# Patient Record
Sex: Female | Born: 1960
Health system: Southern US, Community
[De-identification: ages and names within clinical notes are randomized; demographics above are authoritative.]

## PROBLEM LIST (undated history)

## (undated) DIAGNOSIS — I10 Essential (primary) hypertension: Secondary | ICD-10-CM

## (undated) DIAGNOSIS — F329 Major depressive disorder, single episode, unspecified: Secondary | ICD-10-CM

## (undated) DIAGNOSIS — F32A Depression, unspecified: Secondary | ICD-10-CM

## (undated) DIAGNOSIS — K219 Gastro-esophageal reflux disease without esophagitis: Secondary | ICD-10-CM

## (undated) DIAGNOSIS — E119 Type 2 diabetes mellitus without complications: Secondary | ICD-10-CM

## (undated) DIAGNOSIS — G4730 Sleep apnea, unspecified: Secondary | ICD-10-CM

## (undated) HISTORY — PX: UPPER GASTROINTESTINAL ENDOSCOPY: SHX188

## (undated) HISTORY — PX: DIAGNOSTIC LAPAROSCOPY: SUR761

---

## 1997-04-13 ENCOUNTER — Ambulatory Visit (HOSPITAL_COMMUNITY): Admission: RE | Admit: 1997-04-13 | Discharge: 1997-04-13 | Payer: Self-pay | Admitting: *Deleted

## 1999-12-03 ENCOUNTER — Encounter: Payer: Self-pay | Admitting: Family Medicine

## 1999-12-03 ENCOUNTER — Encounter: Admission: RE | Admit: 1999-12-03 | Discharge: 1999-12-03 | Payer: Self-pay | Admitting: Family Medicine

## 2000-08-18 ENCOUNTER — Encounter: Payer: Self-pay | Admitting: *Deleted

## 2000-08-18 ENCOUNTER — Ambulatory Visit (HOSPITAL_COMMUNITY): Admission: RE | Admit: 2000-08-18 | Discharge: 2000-08-18 | Payer: Self-pay | Admitting: *Deleted

## 2000-09-08 ENCOUNTER — Other Ambulatory Visit: Admission: RE | Admit: 2000-09-08 | Discharge: 2000-09-08 | Payer: Self-pay | Admitting: *Deleted

## 2001-07-16 ENCOUNTER — Ambulatory Visit (HOSPITAL_COMMUNITY): Admission: RE | Admit: 2001-07-16 | Discharge: 2001-07-16 | Payer: Self-pay | Admitting: Family Medicine

## 2001-12-30 ENCOUNTER — Other Ambulatory Visit: Admission: RE | Admit: 2001-12-30 | Discharge: 2001-12-30 | Payer: Self-pay | Admitting: Obstetrics and Gynecology

## 2003-07-03 ENCOUNTER — Other Ambulatory Visit: Admission: RE | Admit: 2003-07-03 | Discharge: 2003-07-03 | Payer: Self-pay | Admitting: Obstetrics and Gynecology

## 2004-10-08 ENCOUNTER — Other Ambulatory Visit: Admission: RE | Admit: 2004-10-08 | Discharge: 2004-10-08 | Payer: Self-pay | Admitting: Obstetrics and Gynecology

## 2006-09-17 ENCOUNTER — Observation Stay (HOSPITAL_COMMUNITY): Admission: EM | Admit: 2006-09-17 | Discharge: 2006-09-18 | Payer: Self-pay | Admitting: Emergency Medicine

## 2008-12-10 ENCOUNTER — Emergency Department (HOSPITAL_COMMUNITY): Admission: EM | Admit: 2008-12-10 | Discharge: 2008-12-10 | Payer: Self-pay | Admitting: Emergency Medicine

## 2009-11-07 ENCOUNTER — Other Ambulatory Visit: Admission: RE | Admit: 2009-11-07 | Discharge: 2009-11-07 | Payer: Self-pay | Admitting: Family Medicine

## 2009-11-22 ENCOUNTER — Encounter: Admission: RE | Admit: 2009-11-22 | Discharge: 2009-11-22 | Payer: Self-pay | Admitting: Gastroenterology

## 2010-06-04 NOTE — Discharge Summary (Signed)
[name], [name]               ACCOUNT NO.:  [id number]   MEDICAL RECORD NO.:  [id number]          PATIENT TYPE:  OBV   LOCATION:  2037                         FACILITY:  [facility]   PHYSICIAN:  Herbie Saxon, MDDATE OF BIRTH:  Jan 01, 1961   DATE OF ADMISSION:  09/17/2006  DATE OF DISCHARGE:  09/18/2006                               DISCHARGE SUMMARY   DISCHARGE DIAGNOSES:  1. Chest pain, noncardiac, musculoskeletal.  2. Hypertension, stable.  3. Depression.   HOSPITAL COURSE:  This 50 year old Caucasian female presented with 10/10  substernal chest pain which  woke her up from sleep on the day of  presentation. The chest pain was intermittent associated with  palpitations, improved with aspirin and nitroglycerin in the emergency  room. There was associated light-headedness; however, the patient's  chest pain was noticed also to be increased with movement and palpation.  Serial cardiac enzymes have been negative. The patient is asymptomatic  at present. Associated anxiety disorder also can be attributing to this  chest pain. The patient has not seen a psychiatrist, Dr. Milagros Evener,  in the last 4 years. She is being discharged home in stable condition to  followup with the primary care physician, Dr. Bradd Canary, in 5-7 days.  A possible stress echo may be ordered as an outpatient. The patient's  primary care physician is to arrange followup reevaluation by  psychiatrist in the next 1-2 weeks.   RADIOLOGY:  The chest x-ray of September 17, 2006 was negative.   DISCHARGE MEDICATIONS:  1. Altace 2.5 mg daily.  2. Citalopram 10 mg daily.  3. Wellbutrin XL 300 mg daily.  4. Niacin 100 mg daily.  5. Aspirin 81 mg daily.  6. Xanax 0.25 mg b.i.d.  7. Nitroglycerin 0.4 mg sublingual p.r.n.  8. Naproxen 500 mg q.12 h p.r.n.   Note that a lipid panel did show that she has dyslipidemia with slightly  low HDL cholesterol and elevated LDL cholesterol of 120, HDL of 38,  total  cholesterol was normal.   DISPOSITION:  Home.   DIET:  Cardiac.   ACTIVITY:  Slowly as tolerated.   PHYSICAL EXAMINATION:  On examination today, she is a middle-age lady  not in acute distress.  Temperature is 98, pulse is 62, respiratory rate is 18, blood pressure  121/59.  Pupils equal, reactive to light and accommodation.  Neck is supple. Oropharynx and nasopharynx are clear. Mucous membranes  are moist. Head is atraumatic, normocephalic. No carotid bruit, no  thyromegaly. No elevated jugular venous distention.  CHEST:  Clear. No tenderness on palpations.  HEART:  S1, S2, regular rate and rhythm. No murmur.  ABDOMEN:  Soft, nontender, no organomegaly. Bowel sounds are normal.  She is alert and oriented x3.  Peripheral pulses present, no pedal edema.  Mood is stable.  </LABORATORY DATA  Total cholesterol 181, LDL fasting 10, HDL fasting 38. Troponin 0.02,  TSH is 2.8.   Discharge less than 30 minutes.      Herbie Saxon, MD  Electronically Signed     MIO/MEDQ  D:  09/18/2006  T:  09/18/2006  Job:  [id number]

## 2010-06-04 NOTE — H&P (Signed)
[name], [name]               ACCOUNT NO.:  [id number]   MEDICAL RECORD NO.:  [id number]          PATIENT TYPE:  OBV   LOCATION:  2037                         FACILITY:  [facility]   PHYSICIAN:  Herbie Saxon, MDDATE OF BIRTH:  Jun 11, 1960   DATE OF ADMISSION:  09/17/2006  DATE OF DISCHARGE:                              HISTORY & PHYSICAL   PRESENTING COMPLAINT:  Chest pain of 1 day's duration.   PSYCHIATRIST:  Milagros Evener, M.D.   PRIMARY CARE PHYSICIAN:  Quita Skye. Artis Flock, M.D.   HISTORY OF PRESENTING COMPLAINT:  This is a 50 year old Caucasian female  who woke up at 5:45 a.m. this morning complaining of 10/10 substernal  pressure like chest pain which woke her up from sleep.  The chest pain  includes specific movement but not with palpation.  She has occasional  palpitations with the heart racing.  She feels slightly lightheaded but  no episode of loss of consciousness or previous syncopal episodes.  There was no diaphoresis.  No nausea, vomiting, diarrhea or abdominal  pain.  No cough.  No body swelling.  She denies any paroxysmal nocturnal  dyspnea or orthopnea.  No skin rash.  No dysuria.  No hematuria.  No  joint swelling.  She denies any back pain, headache or lymph node  swelling.  The patient has not seen her psychiatrist in the last 4  years.  She just stopped on her own.  Continues taking the Celexa and  Wellbutrin at the previous dose.   PAST MEDICAL HISTORY:  1. Hypertension diagnosed 10 years ago.  2. Depression diagnosed 10 years ago.   PAST SURGERIES:  Hysteroscopy to remove scar tissue from her uterus 9  years ago.   FAMILY HISTORY:  Mother has aeruginosum pigmentosa, diabetes and cardiac  arrhythmia.  Sister and brother both have bipolar disease.   SOCIAL HISTORY:  She is married but she is separated from her husband.  No history of tobacco or illicit drug abuse.  She is a social alcohol  drinker.   ALLERGIES:  NO KNOWN DRUG ALLERGIES.   MEDICATIONS:  1. Altace 2.5 mg daily.  2. Citalopram 10 mg daily.  3. Wellbutrin XL 300 mg daily.   REVIEW OF SYSTEMS:  All systems reviewed.  Pertinent positives as above.   PHYSICAL EXAMINATION:  GENERAL:  She is a middle aged lady in no acute  respiratory distress.  VITAL SIGNS:  Temperature 98, pulse 88, respirations 18, blood pressure  109/64.  NEURO:  She is alert and oriented x3.  Cranial nerves I-XII intact.  HEENT:  Oropharynx is clear.  Pupils equal, round, reactive to light and  accommodation.  NECK:  Supple.  No thyromegaly.  No carotid bruits.  No JVD.  CHEST:  Clear.  CARDIAC:  Heart sounds 1 and 2, regular.  No murmurs.  ABDOMEN:  Soft, truncal obesity.  No organomegaly.  Bowel sounds are  normoactive.  EXTREMITIES:  Peripheral pulses present.  No pedal edema.   LABORATORY DATA:  EKG is normal sinus rhythm with 76 beats per minute.  WBC is 6, hematocrit is 36, platelet count  is 330.  D-Dimer is less than  0.2.  Troponin less than 0.04.  CK-MB less than 1.0.  Chemistry shows a  glucose of 114, sodium 139, potassium 4.3, chloride 104, bicarbonate 26,  BUN 12, creatinine 0.7.   ASSESSMENT:  1. Chest pain.  Query query underlying acute coronary syndrome.  More      likely musculoskeletal.  Rule out anxiety or panic attack.  2. Hypertension, stable.  3. Depression.   PLAN:  The patient will be admitted to telemetry floor for monitoring of  the chest pain.  Serial cardiac enzymes and EKG will be done.  Protonix  40 mg IV daily.  Lovenox 40 mg subcutaneously daily.  Consider stress  Myoview, stress echo.  Consider cardiology input.  If cardiac enzymes  are all negative, this could be done as an outpatient.  She will  continue with her previous medications.  Diet to be cardiac.  Activity  bed rest.  IV fluid 1/2 normal saline to give hep-lock.  O2 2 liters  nasal cannula to keep O2 saturations greater than 90%.  This H&P should  be made available to Dr. Bradd Canary.   She is a full code.  Time 40  minutes.      Herbie Saxon, MD  Electronically Signed     MIO/MEDQ  D:  09/17/2006  T:  09/17/2006  Job:  [id number]

## 2010-11-01 LAB — I-STAT 8, (EC8 V) (CONVERTED LAB)
Chloride: 104
Glucose, Bld: 114 — ABNORMAL HIGH
HCT: 41
Potassium: 4.3
pH, Ven: 7.34 — ABNORMAL HIGH

## 2010-11-01 LAB — LIPID PANEL
HDL: 38 — ABNORMAL LOW
HDL: 38 — ABNORMAL LOW
LDL Cholesterol: 117 — ABNORMAL HIGH
Total CHOL/HDL Ratio: 4.8
Triglycerides: 111
VLDL: 23

## 2010-11-01 LAB — CARDIAC PANEL(CRET KIN+CKTOT+MB+TROPI)
CK, MB: 1.1
CK, MB: 1.2
Relative Index: INVALID
Total CK: 51

## 2010-11-01 LAB — POCT CARDIAC MARKERS
Myoglobin, poc: 55.9
Myoglobin, poc: 57.5
Operator id: 279831
Operator id: 279831
Troponin i, poc: <0.05

## 2010-11-01 LAB — CBC
HCT: 36.6
Hemoglobin: 12.7
MCHC: 34.8
MCV: 93.8
Platelets: 330
RBC: 3.9
RDW: 12.7
WBC: 6.8

## 2010-11-01 LAB — URINALYSIS, ROUTINE W REFLEX MICROSCOPIC
Ketones, ur: NEGATIVE
Leukocytes, UA: NEGATIVE
Nitrite: NEGATIVE
Protein, ur: NEGATIVE
Urobilinogen, UA: 0.2

## 2010-11-01 LAB — URINE MICROSCOPIC-ADD ON

## 2010-11-01 LAB — APTT: aPTT: 30

## 2010-11-01 LAB — HOMOCYSTEINE: Homocysteine: 4.6

## 2010-11-01 LAB — CK TOTAL AND CKMB (NOT AT ARMC): Relative Index: INVALID

## 2010-11-01 LAB — TSH: TSH: 2.898

## 2011-01-21 HISTORY — PX: COLONOSCOPY: SHX174

## 2011-07-15 ENCOUNTER — Other Ambulatory Visit: Payer: Self-pay | Admitting: Gastroenterology

## 2011-10-14 ENCOUNTER — Other Ambulatory Visit: Payer: Self-pay | Admitting: Family Medicine

## 2011-10-14 DIAGNOSIS — R109 Unspecified abdominal pain: Secondary | ICD-10-CM

## 2011-10-17 ENCOUNTER — Ambulatory Visit
Admission: RE | Admit: 2011-10-17 | Discharge: 2011-10-17 | Disposition: A | Discharge status: Home / Self Care | Payer: BC Managed Care – PPO | Source: Ambulatory Visit | Attending: Family Medicine | Admitting: Family Medicine

## 2011-10-17 DIAGNOSIS — R109 Unspecified abdominal pain: Secondary | ICD-10-CM

## 2011-11-12 ENCOUNTER — Other Ambulatory Visit: Payer: Self-pay | Admitting: Family Medicine

## 2011-11-12 DIAGNOSIS — R109 Unspecified abdominal pain: Secondary | ICD-10-CM

## 2011-11-14 ENCOUNTER — Ambulatory Visit
Admission: RE | Admit: 2011-11-14 | Discharge: 2011-11-14 | Disposition: A | Discharge status: Home / Self Care | Payer: BC Managed Care – PPO | Source: Ambulatory Visit | Attending: Family Medicine | Admitting: Family Medicine

## 2011-11-14 DIAGNOSIS — R109 Unspecified abdominal pain: Secondary | ICD-10-CM

## 2011-11-14 MED ORDER — IOHEXOL 300 MG/ML  SOLN
125.0000 mL | Freq: Once | INTRAMUSCULAR | Status: AC | PRN
  Administered 2011-11-14: 125 mL via INTRAVENOUS

## 2011-12-01 ENCOUNTER — Other Ambulatory Visit (HOSPITAL_COMMUNITY)
Admission: RE | Admit: 2011-12-01 | Discharge: 2011-12-01 | Disposition: A | Discharge status: Home / Self Care | Payer: BC Managed Care – PPO | Source: Ambulatory Visit | Attending: Family Medicine | Admitting: Family Medicine

## 2011-12-01 ENCOUNTER — Other Ambulatory Visit: Payer: Self-pay | Admitting: Family Medicine

## 2011-12-01 DIAGNOSIS — Z1151 Encounter for screening for human papillomavirus (HPV): Secondary | ICD-10-CM | POA: Insufficient documentation

## 2011-12-01 DIAGNOSIS — Z124 Encounter for screening for malignant neoplasm of cervix: Secondary | ICD-10-CM | POA: Insufficient documentation

## 2013-03-01 ENCOUNTER — Other Ambulatory Visit (HOSPITAL_COMMUNITY)
Admission: RE | Admit: 2013-03-01 | Discharge: 2013-03-01 | Disposition: A | Discharge status: Home / Self Care | Payer: BC Managed Care – PPO | Source: Ambulatory Visit | Attending: Family Medicine | Admitting: Family Medicine

## 2013-03-01 ENCOUNTER — Other Ambulatory Visit: Payer: BC Managed Care – PPO | Admitting: Family Medicine

## 2013-03-01 ENCOUNTER — Other Ambulatory Visit: Payer: Self-pay | Admitting: Family Medicine

## 2013-03-01 DIAGNOSIS — Z1151 Encounter for screening for human papillomavirus (HPV): Secondary | ICD-10-CM | POA: Insufficient documentation

## 2013-03-01 DIAGNOSIS — Z113 Encounter for screening for infections with a predominantly sexual mode of transmission: Secondary | ICD-10-CM | POA: Insufficient documentation

## 2013-03-01 DIAGNOSIS — N760 Acute vaginitis: Secondary | ICD-10-CM | POA: Insufficient documentation

## 2013-03-07 LAB — CERVICOVAGINAL ANCILLARY ONLY
BACTERIAL VAGINITIS: POSITIVE — AB
CANDIDA VAGINITIS: POSITIVE — AB
Chlamydia: NEGATIVE
HPV: NOT DETECTED
Neisseria Gonorrhea: NEGATIVE
TRICH (WINDOWPATH): NEGATIVE

## 2013-05-19 ENCOUNTER — Other Ambulatory Visit: Payer: Self-pay | Admitting: Family Medicine

## 2013-05-19 DIAGNOSIS — R918 Other nonspecific abnormal finding of lung field: Secondary | ICD-10-CM

## 2013-05-25 ENCOUNTER — Ambulatory Visit
Admission: RE | Admit: 2013-05-25 | Discharge: 2013-05-25 | Disposition: A | Discharge status: Home / Self Care | Payer: BC Managed Care – PPO | Source: Ambulatory Visit | Attending: Family Medicine | Admitting: Family Medicine

## 2013-05-25 DIAGNOSIS — R918 Other nonspecific abnormal finding of lung field: Secondary | ICD-10-CM

## 2013-05-25 MED ORDER — IOHEXOL 300 MG/ML  SOLN
75.0000 mL | Freq: Once | INTRAMUSCULAR | Status: AC | PRN
  Administered 2013-05-25: 75 mL via INTRAVENOUS

## 2014-02-05 ENCOUNTER — Emergency Department (HOSPITAL_COMMUNITY): Payer: BLUE CROSS/BLUE SHIELD

## 2014-02-05 ENCOUNTER — Emergency Department (HOSPITAL_COMMUNITY)
Admission: EM | Admit: 2014-02-05 | Discharge: 2014-02-06 | Disposition: A | Discharge status: Home / Self Care | Payer: BLUE CROSS/BLUE SHIELD | Attending: Emergency Medicine | Admitting: Emergency Medicine

## 2014-02-05 ENCOUNTER — Encounter (HOSPITAL_COMMUNITY): Payer: Self-pay

## 2014-02-05 DIAGNOSIS — Z8659 Personal history of other mental and behavioral disorders: Secondary | ICD-10-CM | POA: Diagnosis not present

## 2014-02-05 DIAGNOSIS — J40 Bronchitis, not specified as acute or chronic: Secondary | ICD-10-CM | POA: Diagnosis not present

## 2014-02-05 DIAGNOSIS — R0602 Shortness of breath: Secondary | ICD-10-CM | POA: Diagnosis present

## 2014-02-05 DIAGNOSIS — I10 Essential (primary) hypertension: Secondary | ICD-10-CM | POA: Insufficient documentation

## 2014-02-05 HISTORY — DX: Major depressive disorder, single episode, unspecified: F32.9

## 2014-02-05 HISTORY — DX: Depression, unspecified: F32.A

## 2014-02-05 HISTORY — DX: Essential (primary) hypertension: I10

## 2014-02-05 LAB — CBC
HEMATOCRIT: 37.6 % (ref 36.0–46.0)
Hemoglobin: 13.2 g/dL (ref 12.0–15.0)
MCH: 31.8 pg (ref 26.0–34.0)
MCHC: 35.1 g/dL (ref 30.0–36.0)
MCV: 90.6 fL (ref 78.0–100.0)
Platelets: 335 10*3/uL (ref 150–400)
RBC: 4.15 MIL/uL (ref 3.87–5.11)
RDW: 13.1 % (ref 11.5–15.5)
WBC: 11 10*3/uL — ABNORMAL HIGH (ref 4.0–10.5)

## 2014-02-05 LAB — BASIC METABOLIC PANEL
Anion gap: 11 (ref 5–15)
BUN: 15 mg/dL (ref 6–23)
CALCIUM: 9 mg/dL (ref 8.4–10.5)
CO2: 25 mmol/L (ref 19–32)
CREATININE: 0.7 mg/dL (ref 0.50–1.10)
Chloride: 101 mEq/L (ref 96–112)
Glucose, Bld: 175 mg/dL — ABNORMAL HIGH (ref 70–99)
POTASSIUM: 3.8 mmol/L (ref 3.5–5.1)
Sodium: 137 mmol/L (ref 135–145)

## 2014-02-05 LAB — I-STAT TROPONIN, ED: Troponin i, poc: 0 ng/mL (ref 0.00–0.08)

## 2014-02-05 MED ORDER — ALBUTEROL SULFATE (2.5 MG/3ML) 0.083% IN NEBU
5.0000 mg | INHALATION_SOLUTION | Freq: Once | RESPIRATORY_TRACT | Status: AC
  Administered 2014-02-05: 5 mg via RESPIRATORY_TRACT
  Filled 2014-02-05: qty 6

## 2014-02-05 NOTE — ED Notes (Signed)
Pt. Reports virus x1 week. Was seen by PCP. States has been coughing up green/brown sputum x1 week. Reporting SOB starting around 1900. Denies CP, N/V, dizziness. States was running a fever initially but that has since gone away.

## 2014-02-05 NOTE — ED Notes (Signed)
Pt transported to xray 

## 2014-02-05 NOTE — ED Notes (Signed)
Pt returned from xray

## 2014-02-05 NOTE — ED Provider Notes (Signed)
CSN: [id number]     Arrival date & time 02/05/14  2135 History   First MD Initiated Contact with Patient 02/05/14 2212     Chief Complaint  Patient presents with  . Shortness of Breath  . Cough     (Consider location/radiation/quality/duration/timing/severity/associated sxs/prior Treatment) Patient is a 54 y.o. female presenting with shortness of breath and cough. The history is provided by the patient. No language interpreter was used.  Shortness of Breath Severity:  Moderate Onset quality:  Gradual Duration:  1 week Timing:  Constant Associated symptoms: cough   Associated symptoms: no abdominal pain, no chest pain, no fever, no headaches, no rash, no sore throat and no vomiting   Associated symptoms comment:  Cough for the past one week. She reports fever for the first 4 days and none since (she has not been taking Tylenol or ibuprofen regularly - none since yesterday). No nausea or vomiting. No change in appetite or fluid intake. She denies urinary symptoms. No nasal congestion, sore throat (other than with cough) or sinus pressure. She was seen by her PCP earlier in the week and reports being diagnosed with viral process. She comes tonight because she started feeling short of breath with the cough today. No chest pain. Cough Associated symptoms: shortness of breath   Associated symptoms: no chest pain, no fever, no headaches, no myalgias, no rash and no sore throat     Past Medical History  Diagnosis Date  . Hypertension   . Depression    History reviewed. No pertinent past surgical history. No family history on file. History  Substance Use Topics  . Smoking status: Never Smoker   . Smokeless tobacco: Not on file  . Alcohol Use: Yes     Comment: rarely   OB History    No data available     Review of Systems  Constitutional: Negative for fever and appetite change.  HENT: Negative for congestion, sinus pressure and sore throat.   Respiratory: Positive for cough and  shortness of breath.   Cardiovascular: Negative for chest pain.  Gastrointestinal: Negative for nausea, vomiting and abdominal pain.  Genitourinary: Negative for dysuria.  Musculoskeletal: Negative for myalgias.  Skin: Negative for rash.  Neurological: Negative for weakness and headaches.      Allergies  Review of patient's allergies indicates no known allergies.  Home Medications   Prior to Admission medications   Not on File   BP 131/65 mmHg  Pulse 89  Temp(Src) 98.7 F (37.1 C) (Oral)  Resp 22  Ht 5\' 5"  (1.651 m)  Wt 198 lb (89.812 kg)  BMI 32.95 kg/m2  SpO2 98% Physical Exam  Constitutional: She is oriented to person, place, and time. She appears well-developed and well-nourished.  HENT:  Head: Normocephalic.  Mouth/Throat: Oropharynx is clear and moist.  Hoarse voice.  Eyes: Conjunctivae are normal.  Neck: Normal range of motion. Neck supple.  Cardiovascular: Normal rate and regular rhythm.   Pulmonary/Chest: Effort normal and breath sounds normal. She has no wheezes. She has no rales.  Active coughing with deep respirations.  Abdominal: Soft. Bowel sounds are normal. There is no tenderness. There is no rebound and no guarding.  Musculoskeletal: Normal range of motion.  Neurological: She is alert and oriented to person, place, and time.  Skin: Skin is warm and dry. No rash noted.  Psychiatric: She has a normal mood and affect.    ED Course  Procedures (including critical care time) Labs Review Labs Reviewed  CBC -  Abnormal; Notable for the following:    WBC 11.0 (*)    All other components within normal limits  BASIC METABOLIC PANEL - Abnormal; Notable for the following:    Glucose, Bld 175 (*)    All other components within normal limits  Randolm Idol, ED    Imaging Review Dg Chest 2 View  02/05/2014   CLINICAL DATA:  Short of breath and cough.  History of hypertension.  EXAM: CHEST  2 VIEW  COMPARISON:  06/18/2012  FINDINGS: The heart size and  mediastinal contours are within normal limits. Both lungs are clear. No pleural effusion or pneumothorax. The visualized skeletal structures are unremarkable.  IMPRESSION: No active disease of the chest.   Electronically Signed   By: Lajean Manes M.D.   On: 02/05/2014 22:31     EKG Interpretation None      MDM   Final diagnoses:  SOB (shortness of breath)    1. Bronchitis  Well appearing patient with cough. Clear CXR, normal VS. No tachypnea, tachycardia, hypoxia or fever. She can be discharged home to follow up with PCP for recheck if no better in 2-3 days.    Dewaine Oats, PA-C 02/20/14 7948  Orlie Dakin, MD 02/20/14 1017

## 2014-02-06 MED ORDER — ALBUTEROL SULFATE HFA 108 (90 BASE) MCG/ACT IN AERS
2.0000 | INHALATION_SPRAY | RESPIRATORY_TRACT | Status: DC | PRN
  Administered 2014-02-06: 2 via RESPIRATORY_TRACT
  Filled 2014-02-06: qty 6.7

## 2014-02-06 NOTE — Discharge Instructions (Signed)

## 2014-03-03 ENCOUNTER — Other Ambulatory Visit (HOSPITAL_COMMUNITY)
Admission: RE | Admit: 2014-03-03 | Discharge: 2014-03-03 | Disposition: A | Discharge status: Home / Self Care | Payer: BLUE CROSS/BLUE SHIELD | Source: Ambulatory Visit | Attending: Family Medicine | Admitting: Family Medicine

## 2014-03-03 ENCOUNTER — Other Ambulatory Visit: Payer: Self-pay | Admitting: Family Medicine

## 2014-03-03 DIAGNOSIS — Z124 Encounter for screening for malignant neoplasm of cervix: Secondary | ICD-10-CM | POA: Insufficient documentation

## 2014-03-03 DIAGNOSIS — Z1151 Encounter for screening for human papillomavirus (HPV): Secondary | ICD-10-CM | POA: Diagnosis present

## 2014-03-06 LAB — CYTOLOGY - PAP

## 2016-03-09 IMAGING — CR DG CHEST 2V
2 series · 2 of 2 positions shown · non-contrast
Comparison: 06/18/2012

CLINICAL DATA: Short of breath and cough.  History of hypertension.

EXAM:
CHEST  2 VIEW

[w chest pa]
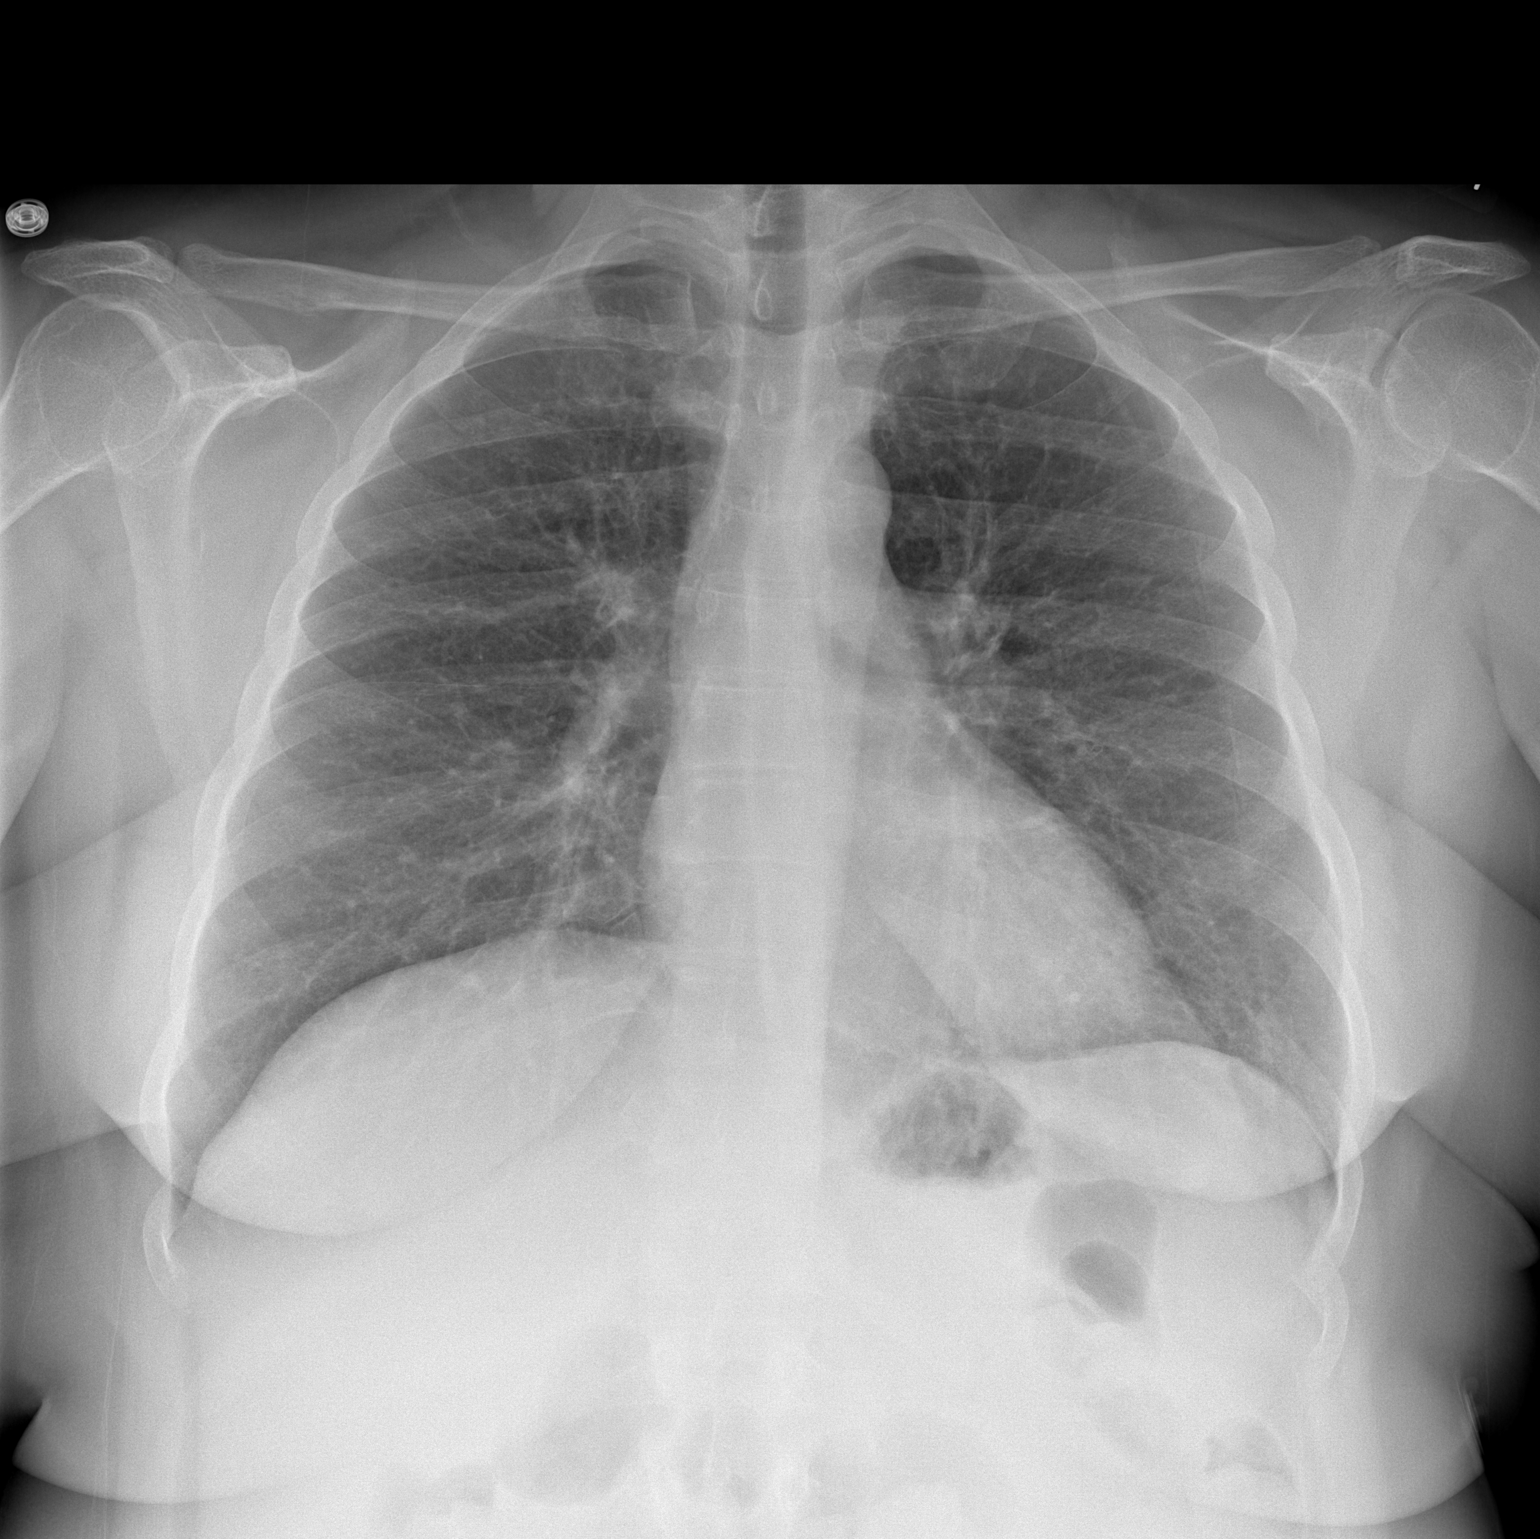

[w chest lat]
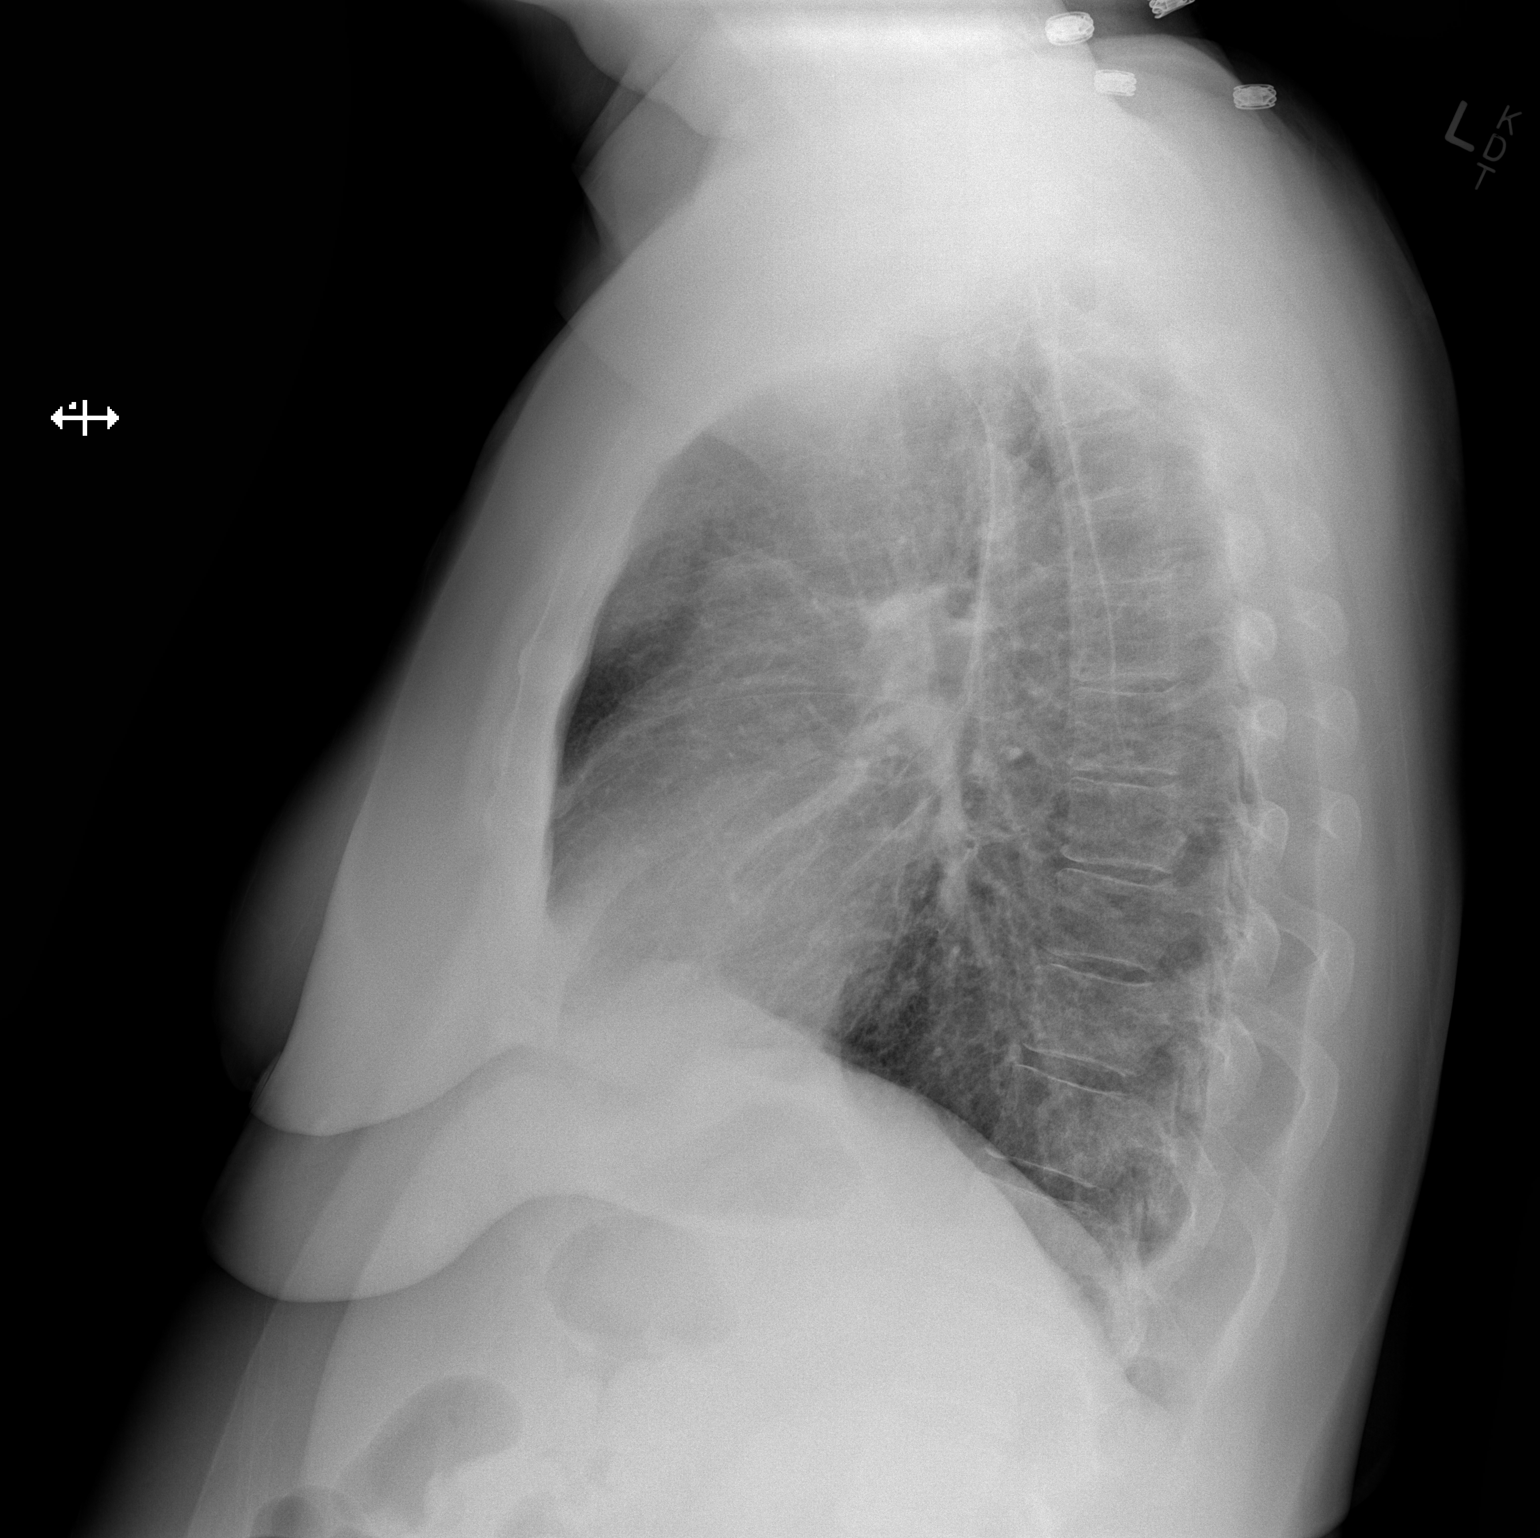

[2 of 2 positions shown; findings below may reference images not displayed]

FINDINGS: The heart size and mediastinal contours are within normal limits.
Both lungs are clear. No pleural effusion or pneumothorax. The
visualized skeletal structures are unremarkable.
IMPRESSION: No active disease of the chest.

## 2017-01-29 DIAGNOSIS — J069 Acute upper respiratory infection, unspecified: Secondary | ICD-10-CM | POA: Diagnosis not present

## 2017-01-29 DIAGNOSIS — R05 Cough: Secondary | ICD-10-CM | POA: Diagnosis not present

## 2017-05-26 DIAGNOSIS — K219 Gastro-esophageal reflux disease without esophagitis: Secondary | ICD-10-CM | POA: Diagnosis not present

## 2017-05-26 DIAGNOSIS — Z0000 Encounter for general adult medical examination without abnormal findings: Secondary | ICD-10-CM | POA: Diagnosis not present

## 2017-05-26 DIAGNOSIS — E782 Mixed hyperlipidemia: Secondary | ICD-10-CM | POA: Diagnosis not present

## 2017-05-26 DIAGNOSIS — E1169 Type 2 diabetes mellitus with other specified complication: Secondary | ICD-10-CM | POA: Diagnosis not present

## 2017-05-26 DIAGNOSIS — I10 Essential (primary) hypertension: Secondary | ICD-10-CM | POA: Diagnosis not present

## 2017-05-26 DIAGNOSIS — R5383 Other fatigue: Secondary | ICD-10-CM | POA: Diagnosis not present

## 2017-06-17 DIAGNOSIS — Z01411 Encounter for gynecological examination (general) (routine) with abnormal findings: Secondary | ICD-10-CM | POA: Diagnosis not present

## 2017-08-06 ENCOUNTER — Encounter (HOSPITAL_BASED_OUTPATIENT_CLINIC_OR_DEPARTMENT_OTHER)
Admission: RE | Admit: 2017-08-06 | Discharge: 2017-08-06 | Disposition: A | Discharge status: Home / Self Care | Payer: 59 | Source: Ambulatory Visit | Attending: Obstetrics and Gynecology | Admitting: Obstetrics and Gynecology

## 2017-08-06 ENCOUNTER — Encounter (HOSPITAL_BASED_OUTPATIENT_CLINIC_OR_DEPARTMENT_OTHER): Payer: Self-pay | Admitting: *Deleted

## 2017-08-06 DIAGNOSIS — Z01812 Encounter for preprocedural laboratory examination: Secondary | ICD-10-CM | POA: Diagnosis not present

## 2017-08-06 LAB — BASIC METABOLIC PANEL
Anion gap: 9 (ref 5–15)
BUN: 16 mg/dL (ref 6–20)
CALCIUM: 9.4 mg/dL (ref 8.9–10.3)
CHLORIDE: 101 mmol/L (ref 98–111)
CO2: 28 mmol/L (ref 22–32)
CREATININE: 0.76 mg/dL (ref 0.44–1.00)
GFR calc Af Amer: >60 mL/min (ref >60)
GFR calc non Af Amer: >60 mL/min (ref >60)
GLUCOSE: 161 mg/dL — AB (ref 70–99)
Potassium: 3.9 mmol/L (ref 3.5–5.1)
Sodium: 138 mmol/L (ref 135–145)

## 2017-08-10 ENCOUNTER — Ambulatory Visit (HOSPITAL_BASED_OUTPATIENT_CLINIC_OR_DEPARTMENT_OTHER): Payer: 59 | Admitting: Anesthesiology

## 2017-08-10 ENCOUNTER — Ambulatory Visit (HOSPITAL_BASED_OUTPATIENT_CLINIC_OR_DEPARTMENT_OTHER)
Admission: RE | Admit: 2017-08-10 | Discharge: 2017-08-10 | Disposition: A | Discharge status: Home / Self Care | Payer: 59 | Source: Ambulatory Visit | Attending: Obstetrics and Gynecology | Admitting: Obstetrics and Gynecology

## 2017-08-10 ENCOUNTER — Encounter (HOSPITAL_BASED_OUTPATIENT_CLINIC_OR_DEPARTMENT_OTHER): Payer: Self-pay | Admitting: Anesthesiology

## 2017-08-10 ENCOUNTER — Encounter (HOSPITAL_BASED_OUTPATIENT_CLINIC_OR_DEPARTMENT_OTHER)
Admission: RE | Disposition: A | Discharge status: Home / Self Care | Payer: Self-pay | Source: Ambulatory Visit | Attending: Obstetrics and Gynecology

## 2017-08-10 ENCOUNTER — Other Ambulatory Visit: Payer: Self-pay

## 2017-08-10 DIAGNOSIS — I10 Essential (primary) hypertension: Secondary | ICD-10-CM | POA: Diagnosis not present

## 2017-08-10 DIAGNOSIS — Z86010 Personal history of colonic polyps: Secondary | ICD-10-CM | POA: Insufficient documentation

## 2017-08-10 DIAGNOSIS — N9989 Other postprocedural complications and disorders of genitourinary system: Secondary | ICD-10-CM | POA: Diagnosis present

## 2017-08-10 DIAGNOSIS — Z888 Allergy status to other drugs, medicaments and biological substances status: Secondary | ICD-10-CM | POA: Insufficient documentation

## 2017-08-10 DIAGNOSIS — E119 Type 2 diabetes mellitus without complications: Secondary | ICD-10-CM | POA: Insufficient documentation

## 2017-08-10 DIAGNOSIS — A58 Granuloma inguinale: Secondary | ICD-10-CM | POA: Diagnosis not present

## 2017-08-10 DIAGNOSIS — Z87891 Personal history of nicotine dependence: Secondary | ICD-10-CM | POA: Diagnosis not present

## 2017-08-10 DIAGNOSIS — K219 Gastro-esophageal reflux disease without esophagitis: Secondary | ICD-10-CM | POA: Diagnosis not present

## 2017-08-10 DIAGNOSIS — K760 Fatty (change of) liver, not elsewhere classified: Secondary | ICD-10-CM | POA: Insufficient documentation

## 2017-08-10 DIAGNOSIS — Z79899 Other long term (current) drug therapy: Secondary | ICD-10-CM | POA: Diagnosis not present

## 2017-08-10 DIAGNOSIS — T8131XD Disruption of external operation (surgical) wound, not elsewhere classified, subsequent encounter: Secondary | ICD-10-CM | POA: Diagnosis not present

## 2017-08-10 DIAGNOSIS — T8131XA Disruption of external operation (surgical) wound, not elsewhere classified, initial encounter: Secondary | ICD-10-CM | POA: Diagnosis not present

## 2017-08-10 DIAGNOSIS — N952 Postmenopausal atrophic vaginitis: Secondary | ICD-10-CM | POA: Diagnosis not present

## 2017-08-10 DIAGNOSIS — Y838 Other surgical procedures as the cause of abnormal reaction of the patient, or of later complication, without mention of misadventure at the time of the procedure: Secondary | ICD-10-CM | POA: Diagnosis not present

## 2017-08-10 DIAGNOSIS — Z7984 Long term (current) use of oral hypoglycemic drugs: Secondary | ICD-10-CM | POA: Diagnosis not present

## 2017-08-10 DIAGNOSIS — E782 Mixed hyperlipidemia: Secondary | ICD-10-CM | POA: Insufficient documentation

## 2017-08-10 HISTORY — PX: LABIOPLASTY: SHX1900

## 2017-08-10 HISTORY — DX: Type 2 diabetes mellitus without complications: E11.9

## 2017-08-10 LAB — GLUCOSE, CAPILLARY
Glucose-Capillary: 101 mg/dL — ABNORMAL HIGH (ref 70–99)
Glucose-Capillary: 90 mg/dL (ref 70–99)
Glucose-Capillary: 93 mg/dL (ref 70–99)

## 2017-08-10 SURGERY — LABIAPLASTY, VULVA
Anesthesia: General | Site: Perineum | Laterality: Left

## 2017-08-10 MED ORDER — BACITRACIN ZINC 500 UNIT/GM EX OINT
TOPICAL_OINTMENT | CUTANEOUS | Status: AC
  Filled 2017-08-10: qty 28.35

## 2017-08-10 MED ORDER — CEFAZOLIN SODIUM-DEXTROSE 2-4 GM/100ML-% IV SOLN
2.0000 g | Freq: Three times a day (TID) | INTRAVENOUS | Status: DC
  Administered 2017-08-10: 2 g via INTRAVENOUS

## 2017-08-10 MED ORDER — DEXAMETHASONE SODIUM PHOSPHATE 4 MG/ML IJ SOLN
INTRAMUSCULAR | Status: DC | PRN
  Administered 2017-08-10: 10 mg via INTRAVENOUS

## 2017-08-10 MED ORDER — SCOPOLAMINE 1 MG/3DAYS TD PT72: 1.0000 | MEDICATED_PATCH | TRANSDERMAL | Status: DC

## 2017-08-10 MED ORDER — ONDANSETRON HCL 4 MG/2ML IJ SOLN
INTRAMUSCULAR | Status: AC
  Filled 2017-08-10: qty 2

## 2017-08-10 MED ORDER — FENTANYL CITRATE (PF) 100 MCG/2ML IJ SOLN
25.0000 ug | INTRAMUSCULAR | Status: DC | PRN
  Administered 2017-08-10 (×2): 50 ug via INTRAVENOUS

## 2017-08-10 MED ORDER — LACTATED RINGERS IV SOLN
INTRAVENOUS | Status: DC
  Administered 2017-08-10: 12:00:00 via INTRAVENOUS

## 2017-08-10 MED ORDER — SCOPOLAMINE 1 MG/3DAYS TD PT72
1.0000 | MEDICATED_PATCH | Freq: Once | TRANSDERMAL | Status: DC | PRN
  Administered 2017-08-10: 1.5 mg via TRANSDERMAL

## 2017-08-10 MED ORDER — CEPHALEXIN 500 MG PO CAPS: 500.0000 mg | ORAL_CAPSULE | Freq: Three times a day (TID) | ORAL | 0 refills | Status: AC

## 2017-08-10 MED ORDER — IBUPROFEN 200 MG PO TABS: 600.0000 mg | ORAL_TABLET | Freq: Four times a day (QID) | ORAL | 0 refills | Status: DC | PRN

## 2017-08-10 MED ORDER — ONDANSETRON HCL 4 MG/2ML IJ SOLN
INTRAMUSCULAR | Status: DC | PRN
  Administered 2017-08-10: 4 mg via INTRAVENOUS

## 2017-08-10 MED ORDER — FENTANYL CITRATE (PF) 100 MCG/2ML IJ SOLN
INTRAMUSCULAR | Status: AC
  Filled 2017-08-10: qty 2

## 2017-08-10 MED ORDER — CEFAZOLIN SODIUM-DEXTROSE 2-4 GM/100ML-% IV SOLN
INTRAVENOUS | Status: AC
  Filled 2017-08-10: qty 100

## 2017-08-10 MED ORDER — SCOPOLAMINE 1 MG/3DAYS TD PT72
MEDICATED_PATCH | TRANSDERMAL | Status: AC
  Filled 2017-08-10: qty 1

## 2017-08-10 MED ORDER — MIDAZOLAM HCL 2 MG/2ML IJ SOLN
INTRAMUSCULAR | Status: AC
  Filled 2017-08-10: qty 2

## 2017-08-10 MED ORDER — FENTANYL CITRATE (PF) 100 MCG/2ML IJ SOLN
50.0000 ug | INTRAMUSCULAR | Status: DC | PRN
  Administered 2017-08-10: 100 ug via INTRAVENOUS

## 2017-08-10 MED ORDER — LIDOCAINE HCL (CARDIAC) PF 100 MG/5ML IV SOSY
PREFILLED_SYRINGE | INTRAVENOUS | Status: DC | PRN
  Administered 2017-08-10: 80 mg via INTRAVENOUS

## 2017-08-10 MED ORDER — DEXAMETHASONE SODIUM PHOSPHATE 10 MG/ML IJ SOLN
INTRAMUSCULAR | Status: AC
  Filled 2017-08-10: qty 1

## 2017-08-10 MED ORDER — TRAMADOL HCL 50 MG PO TABS: 50.0000 mg | ORAL_TABLET | Freq: Four times a day (QID) | ORAL | 0 refills | Status: DC | PRN

## 2017-08-10 MED ORDER — MIDAZOLAM HCL 2 MG/2ML IJ SOLN
1.0000 mg | INTRAMUSCULAR | Status: DC | PRN
  Administered 2017-08-10: 2 mg via INTRAVENOUS

## 2017-08-10 MED ORDER — PROMETHAZINE HCL 25 MG/ML IJ SOLN: 6.2500 mg | INTRAMUSCULAR | Status: DC | PRN

## 2017-08-10 MED ORDER — LIDOCAINE HCL 1 % IJ SOLN
INTRAMUSCULAR | Status: DC | PRN
  Administered 2017-08-10: 10 mL

## 2017-08-10 MED ORDER — PROPOFOL 10 MG/ML IV BOLUS
INTRAVENOUS | Status: DC | PRN
  Administered 2017-08-10: 150 mg via INTRAVENOUS

## 2017-08-10 SURGICAL SUPPLY — 28 items
BLADE SURG 11 STRL SS (BLADE) ×2 IMPLANT
BLADE SURG 15 STRL LF DISP TIS (BLADE) IMPLANT
BLADE SURG 15 STRL SS (BLADE)
CLEANER CAUTERY TIP 5X5 PAD (MISCELLANEOUS) ×1 IMPLANT
ELECT NEEDLE TIP 2.8 STRL (NEEDLE) ×2 IMPLANT
ELECT REM PT RETURN 9FT ADLT (ELECTROSURGICAL) ×2
ELECTRODE REM PT RTRN 9FT ADLT (ELECTROSURGICAL) ×1 IMPLANT
GAUZE PACKING IODOFORM 1/2 (PACKING) IMPLANT
GLOVE BIO SURGEON STRL SZ 6.5 (GLOVE) ×2 IMPLANT
GLOVE BIO SURGEON STRL SZ7 (GLOVE) ×2 IMPLANT
GLOVE BIOGEL PI IND STRL 7.0 (GLOVE) ×2 IMPLANT
GLOVE BIOGEL PI INDICATOR 7.0 (GLOVE) ×2
GOWN STRL REUS W/TWL LRG LVL3 (GOWN DISPOSABLE) ×6 IMPLANT
NEEDLE HYPO 22GX1.5 SAFETY (NEEDLE) ×2 IMPLANT
NS IRRIG 1000ML POUR BTL (IV SOLUTION) ×2 IMPLANT
PACK VAGINAL MINOR WOMEN LF (CUSTOM PROCEDURE TRAY) ×2 IMPLANT
PAD CLEANER CAUTERY TIP 5X5 (MISCELLANEOUS) ×1
PAD OB MATERNITY 4.3X12.25 (PERSONAL CARE ITEMS) ×2 IMPLANT
PAD PREP 24X48 CUFFED NSTRL (MISCELLANEOUS) ×2 IMPLANT
PENCIL BUTTON HOLSTER BLD 10FT (ELECTRODE) ×2 IMPLANT
SUT CHROMIC 3 0 SH 27 (SUTURE) IMPLANT
SUT MNCRL AB 3-0 PS2 18 (SUTURE) ×2 IMPLANT
SUT MON AB 2-0 CT1 36 (SUTURE) ×2 IMPLANT
SUT VIC AB 3-0 PS1 18 (SUTURE) ×1
SUT VIC AB 3-0 PS1 18XBRD (SUTURE) ×1 IMPLANT
TOWEL OR 17X24 6PK STRL BLUE (TOWEL DISPOSABLE) ×2 IMPLANT
TUBING NON-CON 1/4 X 20 CONN (TUBING) IMPLANT
YANKAUER SUCT BULB TIP NO VENT (SUCTIONS) IMPLANT

## 2017-08-10 NOTE — Transfer of Care (Signed)
Immediate Anesthesia Transfer of Care Note  Patient: [name]  Procedure(s) Performed: LABIAPLASTY REVISION (Left Perineum)  Patient Location: PACU  Anesthesia Type:General  Level of Consciousness: sedated and responds to stimulation  Airway & Oxygen Therapy: Patient Spontanous Breathing and Patient connected to face mask oxygen  Post-op Assessment: Report given to RN and Post -op Vital signs reviewed and stable  Post vital signs: Reviewed and stable  Last Vitals:  Vitals Value Taken Time  BP    Temp    Pulse    Resp    SpO2      Last Pain:  Vitals:   08/10/17 1201  TempSrc: Oral  PainSc: 1       Patients Stated Pain Goal: 0 (11/46/43 1427)  Complications: No apparent anesthesia complications

## 2017-08-10 NOTE — Anesthesia Preprocedure Evaluation (Signed)
Anesthesia Evaluation  Patient identified by MRN, date of birth, ID band Patient awake    Reviewed: Allergy & Precautions, NPO status , Patient's Chart, lab work & pertinent test results  Airway Mallampati: II  TM Distance: >3 FB Neck ROM: Full    Dental no notable dental hx. (+) Dental Advisory Given   Pulmonary neg pulmonary ROS,    Pulmonary exam normal        Cardiovascular hypertension, Pt. on medications Normal cardiovascular exam     Neuro/Psych PSYCHIATRIC DISORDERS Depression negative neurological ROS     GI/Hepatic negative GI ROS, Neg liver ROS,   Endo/Other  diabetes  Renal/GU negative Renal ROS  negative genitourinary   Musculoskeletal negative musculoskeletal ROS (+)   Abdominal   Peds negative pediatric ROS (+)  Hematology negative hematology ROS (+)   Anesthesia Other Findings   Reproductive/Obstetrics negative OB ROS                             Anesthesia Physical Anesthesia Plan  ASA: II  Anesthesia Plan: General   Post-op Pain Management:    Induction: Intravenous  PONV Risk Score and Plan: 4 or greater and Ondansetron, Dexamethasone, Scopolamine patch - Pre-op and Diphenhydramine  Airway Management Planned: LMA  Additional Equipment:   Intra-op Plan:   Post-operative Plan: Extubation in OR  Informed Consent: I have reviewed the patients History and Physical, chart, labs and discussed the procedure including the risks, benefits and alternatives for the proposed anesthesia with the patient or authorized representative who has indicated his/her understanding and acceptance.   Dental advisory given  Plan Discussed with: CRNA and Anesthesiologist  Anesthesia Plan Comments:         Anesthesia Quick Evaluation

## 2017-08-10 NOTE — Anesthesia Procedure Notes (Signed)
Procedure Name: LMA Insertion Date/Time: 08/10/2017 1:26 PM Performed by: [name], CRNA Pre-anesthesia Checklist: Patient identified, Emergency Drugs available, Suction available and Patient being monitored Patient Re-evaluated:Patient Re-evaluated prior to induction Oxygen Delivery Method: Circle system utilized Preoxygenation: Pre-oxygenation with 100% oxygen Induction Type: IV induction Ventilation: Mask ventilation without difficulty LMA: LMA inserted LMA Size: 4.0 Number of attempts: 1 Airway Equipment and Method: Bite block Placement Confirmation: positive ETCO2 Tube secured with: Tape Dental Injury: Teeth and Oropharynx as per pre-operative assessment

## 2017-08-10 NOTE — H&P (Signed)
History of Present Illness  General:  Pt s/p Labiaplasty on 07/22/17. Pt has been treated with antibiotics for wound infection. Pt reports decreased discharge. Overall less painful but there is one area that is significantly painful. Pt is still requiring po pain medications.   Current Medications  Taking   Allegra Allergy(Fexofenadine HCl) 180 MG Tablet 1 tablet as needed Orally Once a day   Amaryl(Glimepiride) 4 MG Tablet 1 tablet with breakfast or the first main meal of the day Orally Once a day, Notes: MILLER   Atorvastatin Calcium 10 MG Tablet 1 tablet Orally Once a day   Wellbutrin XL(buPROPion HCl ER (XL)) 300 MG Tablet Extended Release 24 Hour 1 tablet Orally Once a day   Pantoprazole Sodium 40 MG Tablet Delayed Release 1 tablet Orally Once a day   Ramipril 5 MG Capsule 1 capsule Once a day   Ultram(traMADol HCl) 50 MG Tablet 1 tablet as needed Orally Every 6 hours as needed   Keflex(Cephalexin) 500 MG Capsule 1 capsule Orally every 6 hrs   Lidocaine HCl 4 % Cream as directed Externally 4 times daily prn pain   Diflucan(Fluconazole) 150 MG Tablet 1 tablet Orally Once   Citalopram Hydrobromide 40 MG Tablet 1 tablet Orally Once a day, Notes: MILLER   Tramadol HCl 50 MG Tablet 1 tablet as needed Orally Once a day   Medication List reviewed and reconciled with the patient    Past Medical History  Depression - lifelong in remission.   Hypertension.   Hyperlipidemia, mixed.   bleeding erosive gastropathy noted on EGD in 07/2011.   Hyperplastic polyps on colonoscopy in 07/2011, repeat in 10 years.   GERD.   Fatty liver on ultrasound in 09/2011.   incidental 25mm right lower lung nodule seen in CT ab/pel 10/2011, CT chest in 05/2013 normal, no further evaluation necessary.   DM 4/17 .   Eye exam 11/18 no DM retin .           Surgical History  scar tissue removed from uterus   multiple D&C from miscarriages   Colonoscopy 2013  Endoscopy 2013  LEEP 05/1993  oral surgeries  with graft    Family History  Father: deceased 84 yrs, accident  Mother: alive 63 yrs, retinitis pigmentosa, irregular heart beat, osteoarthritis;?colon polyps,high calcium, mild dementia, diagnosed with Hypertension  Brother 1: alive 4 yrs, bipolar disorder  Sister 1: alive 18 yrs, bipolar disorder, DM, HTN, hyperlipidemia, Diabetes  No family history of colon cancer or liver disease.\\\nMultiple cousins on maternal side with - Breast Cancer. One cousin with ovarian cancer. \\\nNo CAD. Various heart conditions on maternal side. \\\n2 cousins with lupus.   Social History  General:  no EXPOSURE TO PASSIVE SMOKE, in the past x 5 yrs, quit 1990.  Alcohol: yes, 1-2 drinks/month.  Children: none.  Caffeine: yes.  no DIET.  EDUCATION: yes.  Tobacco use  cigarettes: Former smoker Quit in year 1990 Pack-year Hx: 5 Tobacco history last updated 08/05/2017 no Tobacco Exposure.  Marital Status: married.  no Recreational drug use.  OCCUPATION: employed, Engineer, maintenance ([employer]).  no Exercise, depends.    Gyn History  Sexual activity currently sexually active.  Periods : no period more than 2 years.  Denies H/O LMP.  Birth control condoms.  Last pap smear date 05/13/2016 Neg/HPVneg.  Last mammogram date 12/2014.  Abnormal pap smear maybe in 1995, treated with LEEP.  STD none.    OB History  Number of pregnancies 8, all SAB.  Allergies  Flagyl: sick to stomach, fever  Metformin HCl: hot flashes at 500mg  bid - Side Effects   Hospitalization/Major Diagnostic Procedure  chest pain, negative work up 2008  None this past yr 05/2017   Vital Signs  Wt 207.7, Wt change -1.8 lb, Ht 65.5, BMI 34.03, Pulse sitting 81, BP sitting 120/70.   Physical Examination  GENERAL:  Patient appears alert and oriented.  General Appearance: well-appearing, well-developed, no acute distress.  Speech: clear.  FEMALE GENITOURINARY:  Vulva: left labia is edematous. No exudate. suture line is completed disrupted except  for 1 small area at the apex. no bleeding, no foul odor. Severe tenderness at lower edge.     Assessments   1. Status post surgery - Z98.890 (Primary), Left labiaplasty   2. Dehiscence of operative wound, subsequent encounter - T81.31XD   Treatment  1. Dehiscence of operative wound, subsequent encounter  Notes: Recommend revision of wound. superficially dehisced. Area of pain which is requiring pain meds can be reapproximated. Pt counseled on procedure. Desires to proceed.    Procedure Codes  [phone] POSTOP F U VISIT   Follow Up  Less than 1 week

## 2017-08-10 NOTE — Anesthesia Postprocedure Evaluation (Signed)
Anesthesia Post Note  Patient: [name]  Procedure(s) Performed: LABIAPLASTY REVISION (Left Perineum)     Patient location during evaluation: PACU Anesthesia Type: General Level of consciousness: sedated Pain management: pain level controlled Vital Signs Assessment: post-procedure vital signs reviewed and stable Respiratory status: spontaneous breathing and respiratory function stable Cardiovascular status: stable Postop Assessment: no apparent nausea or vomiting Anesthetic complications: no    Last Vitals:  Vitals:   08/10/17 1515 08/10/17 1530  BP: 108/65   Pulse: 82 76  Resp: 13 16  Temp:  36.6 C  SpO2: 93% 98%    Last Pain:  Vitals:   08/10/17 1530  TempSrc:   PainSc: 2                  [name] DANIEL

## 2017-08-10 NOTE — Op Note (Signed)
NAME: [name], [name] MEDICAL RECORD [id number] ACCOUNT [id number] DATE OF BIRTH:1960-05-06 FACILITY: [facility] LOCATION: [facility] PHYSICIAN:Kaydenn Mclear Al Decant, MD  OPERATIVE REPORT  DATE OF PROCEDURE:  08/10/2017  PREOPERATIVE DIAGNOSIS:  Labioplasty revision, wound breakdown.  POSTOPERATIVE DIAGNOSIS:  Labioplasty revision, wound breakdown.  PROCEDURE:  Labioplasty revision.  SURGEON:  Thurnell Lose, MD  ANESTHESIA:  Local and general, 1% lidocaine 10 mL.  ESTIMATED BLOOD LOSS:  40.  BLOOD ADMINISTERED:  None.  DRAINS:  None.    SPECIMENS:  Vaginal discharge, wet prep.  DISPOSITION OF SPECIMEN:  To lab.  PATIENT DISPOSITION:  To PACU hemodynamically stable.  COMPLICATIONS:  None.  FINDINGS:  Disrupted incision with a few sutures intact, moderate granulation tissue, vaginal atrophy in the inner labia and white vaginal discharge in the vagina.  DESCRIPTION OF PROCEDURE:  The patient was identified in the holding area.  She was then taken to the operating room with IV running.  She did report having some itching.  I would plan to collect some of the vaginal discharge to make sure she had not  developed a yeast infection.  She was placed in the dorsal lithotomy position, underwent exam under anesthesia without complication.  She was then prepped and draped in a normal sterile fashion.  A timeout was performed.  SCDs were on her legs and  operating.  She received Ancef 2 grams IV prior to the procedure.  A Graves speculum was inserted inside the vagina before the prep was performed.  The vaginal discharge was collected with a spoon and sent to path with saline.  The prep was performed and then we prepped and draped.  There were maybe 2 sutures that were still intact.  It was obvious that the incision had torn through and opened.  It seemed to be a little edematous, but significantly less than previous.  I cut the sutures that were left intact and I removed all of the  remaining sutures from the suture line.  I then debrided to get some good granulation tissue and then I marked the area and I went around the outside of the edges of the old incision to get normal healthy tissue to reapproximate.  I used the needle tip  Bovie on cut to do that.  Once that was removed, I cauterized the subcutaneous tissue with a needlepoint Bovie.  I then reapproximated the subcutaneous space with 2-0 Vicryl with a series of interrupted sutures.  The skin was reapproximated in a  horizontal mattress fashion with 2-0 Monocryl.  When I got to the bottom of the incision near the peritoneum, I noted that when I placed a suture, the incision tore through on the inner labia, which led me to believe that maybe that is where the incision  initially disrupted, which it looked like it was just due to atrophy.  So I did take a deeper incision to avoid that.  At the end of the closure, the area was hemostatic.  I then injected 1% lidocaine 10 mL along the suture for anesthesia.  Bactroban  and a Telfa will be applied to the incision.  All instrument, sponge and needle counts were correct x3.  The patient tolerated the procedure well and was taken to the recovery room in stable condition.  TN/NUANCE  D:08/10/2017 T:08/10/2017 JOB:[id number]

## 2017-08-10 NOTE — Discharge Instructions (Addendum)
Incision Care, Adult °An incision is a surgical cut that is made through your skin. Most incisions are closed after surgery. Your incision may be closed with stitches (sutures), staples, skin glue, or adhesive strips. You may need to return to your health care provider to have sutures or staples removed. This may occur several days to several weeks after your surgery. The incision needs to be cared for properly to prevent infection. °How to care for your incision °Incision care ° °· Follow instructions from your health care provider about how to take care of your incision. Make sure you: °? Wash your hands with soap and water before you change the bandage (dressing). If soap and water are not available, use hand sanitizer. °? Change your dressing as told by your health care provider. °? Leave sutures, skin glue, or adhesive strips in place. These skin closures may need to stay in place for 2 weeks or longer. If adhesive strip edges start to loosen and curl up, you may trim the loose edges. Do not remove adhesive strips completely unless your health care provider tells you to do that. °· Check your incision area every day for signs of infection. Check for: °? More redness, swelling, or pain. °? More fluid or blood. °? Warmth. °? Pus or a bad smell. °· Ask your health care provider how to clean the incision. This may include: °? Using mild soap and water. °? Using a clean towel to pat the incision dry after cleaning it. °? Applying a cream or ointment. Do this only as told by your health care provider. °? Covering the incision with a clean dressing. °· Ask your health care provider when you can leave the incision uncovered. °· Do not take baths, swim, or use a hot tub until your health care provider approves. Ask your health care provider if you can take showers. You may only be allowed to take sponge baths for bathing. °Medicines °· If you were prescribed an antibiotic medicine, cream, or ointment, take or apply the  antibiotic as told by your health care provider. Do not stop taking or applying the antibiotic even if your condition improves. °· Take over-the-counter and prescription medicines only as told by your health care provider. °General instructions °· Limit movement around your incision to improve healing. °? Avoid straining, lifting, or exercise for the first month, or for as long as told by your health care provider. °? Follow instructions from your health care provider about returning to your normal activities. °? Ask your health care provider what activities are safe. °· Protect your incision from the sun when you are outside for the first 6 months, or for as long as told by your health care provider. Apply sunscreen around the scar or cover it up. °· Keep all follow-up visits as told by your health care provider. This is important. °Contact a health care provider if: °· Your have more redness, swelling, or pain around the incision. °· You have more fluid or blood coming from the incision. °· Your incision feels warm to the touch. °· You have pus or a bad smell coming from the incision. °· You have a fever or shaking chills. °· You are nauseous or you vomit. °· You are dizzy. °· Your sutures or staples come undone. °Get help right away if: °· You have a red streak coming from your incision. °· Your incision bleeds through the dressing and the bleeding does not stop with gentle pressure. °· The edges of   your incision open up and separate.  You have severe pain.  You have a rash.  You are confused.  You faint.  You have trouble breathing and a fast heartbeat. This information is not intended to replace advice given to you by your health care provider. Make sure you discuss any questions you have with your health care provider. Document Released: 07/26/2004 Document Revised: 09/14/2015 Document Reviewed: 07/25/2015 Elsevier Interactive Patient Education  2018 [facility]  Instructions  Activity: Get plenty of rest for the remainder of the day. A responsible individual must stay with you for 24 hours following the procedure.  For the next 24 hours, DO NOT: -Drive a car -Paediatric nurse -Drink alcoholic beverages -Take any medication unless instructed by your physician -Make any legal decisions or sign important papers.  Meals: Start with liquid foods such as gelatin or soup. Progress to regular foods as tolerated. Avoid greasy, spicy, heavy foods. If nausea and/or vomiting occur, drink only clear liquids until the nausea and/or vomiting subsides. Call your physician if vomiting continues.  Special Instructions/Symptoms: Your throat may feel dry or sore from the anesthesia or the breathing tube placed in your throat during surgery. If this causes discomfort, gargle with warm salt water. The discomfort should disappear within 24 hours.  If you had a scopolamine patch placed behind your ear for the management of post- operative nausea and/or vomiting:  1. The medication in the patch is effective for 72 hours, after which it should be removed.  Wrap patch in a tissue and discard in the trash. Wash hands thoroughly with soap and water. 2. You may remove the patch earlier than 72 hours if you experience unpleasant side effects which may include dry mouth, dizziness or visual disturbances. 3. Avoid touching the patch. Wash your hands with soap and water after contact with the patch.

## 2017-08-10 NOTE — Brief Op Note (Signed)
08/10/2017  2:04 PM  PATIENT:  [name]  57 y.o. female  PRE-OPERATIVE DIAGNOSIS:  N90.60 labiaplasty wound break down, left  POST-OPERATIVE DIAGNOSIS:  Same  PROCEDURE:  Procedure(s) with comments: LABIAPLASTY REVISION (Left) - Labiaplasty revision  SURGEON:  Surgeon(s) and Role:    Thurnell Lose, MD - Primary  PHYSICIAN ASSISTANT:   ASSISTANTS: Technician   ANESTHESIA:   local and general  EBL:  40 ml   BLOOD ADMINISTERED:none  DRAINS: none   LOCAL MEDICATIONS USED:  1 %LIDOCAINE  and Amount: 10 ml  SPECIMEN:  Source of Specimen:  vaginal discharge-wet prep  DISPOSITION OF SPECIMEN:  Lab  COUNTS:  YES  TOURNIQUET:  * No tourniquets in log *  DICTATION: .Other Dictation: Dictation Number [id number]   PLAN OF CARE: Discharge to home after PACU  PATIENT DISPOSITION:  PACU - hemodynamically stable.   Delay start of Pharmacological VTE agent (>24hrs) due to surgical blood loss or risk of bleeding: not applicable

## 2017-08-10 NOTE — Interval H&P Note (Signed)
History and Physical Interval Note:  08/10/2017 1:02 PM  [name]  has presented today for surgery, with the diagnosis of N90.60 labiaplasty  The various methods of treatment have been discussed with the patient and family. After consideration of risks, benefits and other options for treatment, the patient has consented to  Procedure(s) with comments: LABIAPLASTY (N/A) - Labiaplasty revision as a surgical intervention .  The patient's history has been reviewed, patient examined, no change in status, stable for surgery.  I have reviewed the patient's chart and labs.  Questions were answered to the patient's satisfaction.     Thurnell Lose

## 2017-08-11 ENCOUNTER — Encounter (HOSPITAL_BASED_OUTPATIENT_CLINIC_OR_DEPARTMENT_OTHER): Payer: Self-pay | Admitting: Obstetrics and Gynecology

## 2017-09-08 DIAGNOSIS — G4733 Obstructive sleep apnea (adult) (pediatric): Secondary | ICD-10-CM | POA: Diagnosis not present

## 2017-09-15 DIAGNOSIS — G4733 Obstructive sleep apnea (adult) (pediatric): Secondary | ICD-10-CM | POA: Diagnosis not present

## 2017-10-07 DIAGNOSIS — M79672 Pain in left foot: Secondary | ICD-10-CM | POA: Diagnosis not present

## 2017-10-07 DIAGNOSIS — M67431 Ganglion, right wrist: Secondary | ICD-10-CM | POA: Diagnosis not present

## 2017-10-07 DIAGNOSIS — M25531 Pain in right wrist: Secondary | ICD-10-CM | POA: Diagnosis not present

## 2017-10-16 DIAGNOSIS — G4733 Obstructive sleep apnea (adult) (pediatric): Secondary | ICD-10-CM | POA: Diagnosis not present

## 2017-10-19 DIAGNOSIS — G5601 Carpal tunnel syndrome, right upper limb: Secondary | ICD-10-CM | POA: Diagnosis not present

## 2017-10-22 DIAGNOSIS — G5601 Carpal tunnel syndrome, right upper limb: Secondary | ICD-10-CM | POA: Diagnosis not present

## 2017-10-22 DIAGNOSIS — M67431 Ganglion, right wrist: Secondary | ICD-10-CM | POA: Diagnosis not present

## 2017-10-22 DIAGNOSIS — G5600 Carpal tunnel syndrome, unspecified upper limb: Secondary | ICD-10-CM | POA: Insufficient documentation

## 2017-10-27 DIAGNOSIS — Z1231 Encounter for screening mammogram for malignant neoplasm of breast: Secondary | ICD-10-CM | POA: Diagnosis not present

## 2017-11-09 DIAGNOSIS — G4733 Obstructive sleep apnea (adult) (pediatric): Secondary | ICD-10-CM | POA: Diagnosis not present

## 2017-11-15 DIAGNOSIS — G4733 Obstructive sleep apnea (adult) (pediatric): Secondary | ICD-10-CM | POA: Diagnosis not present

## 2017-12-14 ENCOUNTER — Other Ambulatory Visit: Payer: Self-pay | Admitting: Orthopedic Surgery

## 2017-12-16 DIAGNOSIS — G4733 Obstructive sleep apnea (adult) (pediatric): Secondary | ICD-10-CM | POA: Diagnosis not present

## 2019-11-23 ENCOUNTER — Ambulatory Visit: Payer: 59 | Attending: Internal Medicine

## 2019-11-23 ENCOUNTER — Other Ambulatory Visit (HOSPITAL_COMMUNITY): Payer: Self-pay | Admitting: Internal Medicine

## 2019-11-23 DIAGNOSIS — Z23 Encounter for immunization: Secondary | ICD-10-CM

## 2019-11-23 NOTE — Progress Notes (Signed)
   Covid-19 Vaccination Clinic  Name:  [name]    MRN: [id number] DOB: 1960/04/14  11/23/2019  [name] was observed post Covid-19 immunization for 15 minutes without incident. She was provided with Vaccine Information Sheet and instruction to access the V-Safe system.   [name] was instructed to call 911 with any severe reactions post vaccine: Marland Kitchen Difficulty breathing  . Swelling of face and throat  . A fast heartbeat  . A bad rash all over body  . Dizziness and weakness

## 2019-11-23 NOTE — Progress Notes (Signed)
   Covid-19 Vaccination Clinic  Name:  [name]    MRN: [id number] DOB: 06-17-1960  11/23/2019  [name] was observed post Covid-19 immunization for 15 minutes without incident. She was provided with Vaccine Information Sheet and instruction to access the V-Safe system.   [name] was instructed to call 911 with any severe reactions post vaccine: Marland Kitchen Difficulty breathing  . Swelling of face and throat  . A fast heartbeat  . A bad rash all over body  . Dizziness and weakness

## 2019-12-22 DIAGNOSIS — H6980 Other specified disorders of Eustachian tube, unspecified ear: Secondary | ICD-10-CM | POA: Diagnosis not present

## 2019-12-22 DIAGNOSIS — H6092 Unspecified otitis externa, left ear: Secondary | ICD-10-CM | POA: Diagnosis not present

## 2019-12-28 DIAGNOSIS — G4733 Obstructive sleep apnea (adult) (pediatric): Secondary | ICD-10-CM | POA: Diagnosis not present

## 2020-01-05 DIAGNOSIS — B029 Zoster without complications: Secondary | ICD-10-CM | POA: Diagnosis not present

## 2020-01-10 DIAGNOSIS — Z713 Dietary counseling and surveillance: Secondary | ICD-10-CM | POA: Diagnosis not present

## 2020-01-30 LAB — HM PAP SMEAR

## 2020-01-30 LAB — BASIC METABOLIC PANEL
BUN: 15 (ref 4–21)
Creatinine: 0.7 (ref 0.5–1.1)
Glucose: 172

## 2020-01-30 LAB — LIPID PANEL
Cholesterol: 154 (ref 0–200)
HDL: 42 (ref 35–70)
LDL Cholesterol: 96
Triglycerides: 88 (ref 40–160)

## 2020-02-20 LAB — HM MAMMOGRAPHY

## 2020-04-24 NOTE — H&P (Signed)
[name] is an 60 y.o.  G8P0 s/p LEEP (1990s) who is admitted for cervical dilation and endocervical curettage.  She was seen for colposcopy on 02/24/20 for evaluation of ASC-H pap smear with negative HRHPV (01/30/20). Cervical biopsy revealed atrophic squamous mucosa, negative for viral effect, dysplasia, and malignancy. Transformation zone not represented.  Due to cervical stenosis, unable to obtain ECC sampling. Colposcopy normal; however, given her history of LEEP and ASCH-H pap smear, recommend sampling of endocervical cells. Patient had difficulty tolerating outpatient procedure and after given option to return in the office after Cytotec administration vs outpatient procedure for cervical dilation and sampling, patient opts for outpatient sampling.  Pap smear history: 01/30/20: ASC-H/HRHPV neg 03/09/14:  NILM/HRHPV neg 2/10/1/5:  NILM/HRHPV neg 12/01/11:  NILM/HRHPV neg 11/07/09:  NILM/HRHPV neg   There are no problems to display for this patient.   MEDICAL/FAMILY/SOCIAL HX: Patient's last menstrual period was 02/02/2013.    Past Medical History:  Diagnosis Date  . Depression   . Diabetes mellitus without complication ([place])   . Hypertension     Past Surgical History:  Procedure Laterality Date  . DIAGNOSTIC LAPAROSCOPY    . LABIOPLASTY Left 08/10/2017   Procedure: LABIAPLASTY REVISION;  Surgeon: Thurnell Lose, MD;  Location: [place];  Service: Gynecology;  Laterality: Left;  Labiaplasty revision    No family history on file.  Social History:  reports that she has never smoked. She has never used smokeless tobacco. She reports current alcohol use. She reports that she does not use drugs.  ALLERGIES/MEDS:  Allergies:  Allergies  Allergen Reactions  . Flagyl [Metronidazole] Diarrhea    No medications prior to admission.     Review of Systems  Constitutional: Negative.   HENT: Negative.   Eyes: Negative.   Respiratory: Negative.    Cardiovascular: Negative.   Gastrointestinal: Negative.   Genitourinary: Negative.   Musculoskeletal: Negative.   Skin: Negative.     Last menstrual period 02/02/2013. Gen:  NAD Cardio:  RRR Lungs:  CTAB, no wheezes/rales/rhonchi Abd:  Soft, non-distended, non-tender throughout Ext:  No bilateral LE edema Pelvic (2/22): Normal external genitalia, vaginal mucosa pink, cervix atrophic and cervical os stenotic  No results found for this or any previous visit (from the past 24 hour(s)).  No results found.   ASSESSMENT/PLAN: [name] is a 60 y.o. G8P0 s/p LEEP (1990s) who is admitted for cervical dilation and endocervical curettage for ASC-H pap smear and unable to sample endocervix with ECC due to cervical stenosis  - Admit to [facility] - Admit labs (CBC, T&S, COVID screen) - Diet:  NPO - IVF:  Per anethesia - VTE Prophylaxis:  SCDs - Antibiotics:  None indicated - D/C home same day  Consents: We discussed the purpose of this procedure is to sample the endocervix to rule out malignancy. Counseled on risks which include but are not limited to infection, injury to surrounding organs, blood loss, need for blood transfusion, blood clots, and pulmonary embolism. We discussed that there are risks that could not have been predicted or prevented. Patient was consented for blood products.  The patient is aware that bleeding may result in the need for a blood transfusion which includes risk of transmission of HIV (1:2 million), Hepatitis C (1:2 million), and Hepatitis B (1:200 thousand) and transfusion reaction.  Patient voiced understanding of the above risks as well as understanding of indications for blood transfusion.    Drema Dallas, DO [phone] (office)

## 2020-05-02 ENCOUNTER — Other Ambulatory Visit: Payer: Self-pay

## 2020-05-02 ENCOUNTER — Encounter (HOSPITAL_BASED_OUTPATIENT_CLINIC_OR_DEPARTMENT_OTHER): Payer: Self-pay | Admitting: Obstetrics and Gynecology

## 2020-05-07 ENCOUNTER — Ambulatory Visit (HOSPITAL_COMMUNITY): Admission: RE | Admit: 2020-05-07 | Payer: BC Managed Care – PPO | Source: Ambulatory Visit

## 2020-05-07 ENCOUNTER — Other Ambulatory Visit (HOSPITAL_COMMUNITY)
Admission: RE | Admit: 2020-05-07 | Discharge: 2020-05-07 | Disposition: A | Discharge status: Home / Self Care | Payer: BC Managed Care – PPO | Source: Ambulatory Visit | Attending: Obstetrics and Gynecology | Admitting: Obstetrics and Gynecology

## 2020-05-07 ENCOUNTER — Encounter (HOSPITAL_BASED_OUTPATIENT_CLINIC_OR_DEPARTMENT_OTHER)
Admission: RE | Admit: 2020-05-07 | Discharge: 2020-05-07 | Disposition: A | Discharge status: Home / Self Care | Payer: BC Managed Care – PPO | Source: Ambulatory Visit | Attending: Obstetrics and Gynecology | Admitting: Obstetrics and Gynecology

## 2020-05-07 ENCOUNTER — Ambulatory Visit
Admission: RE | Admit: 2020-05-07 | Discharge: 2020-05-07 | Disposition: A | Discharge status: Home / Self Care | Payer: BC Managed Care – PPO | Source: Ambulatory Visit | Attending: Obstetrics and Gynecology | Admitting: Obstetrics and Gynecology

## 2020-05-07 ENCOUNTER — Other Ambulatory Visit: Payer: Self-pay

## 2020-05-07 DIAGNOSIS — Z20822 Contact with and (suspected) exposure to covid-19: Secondary | ICD-10-CM | POA: Insufficient documentation

## 2020-05-07 DIAGNOSIS — I10 Essential (primary) hypertension: Secondary | ICD-10-CM | POA: Diagnosis not present

## 2020-05-07 DIAGNOSIS — Z01818 Encounter for other preprocedural examination: Secondary | ICD-10-CM | POA: Insufficient documentation

## 2020-05-07 DIAGNOSIS — N870 Mild cervical dysplasia: Secondary | ICD-10-CM | POA: Diagnosis not present

## 2020-05-07 DIAGNOSIS — E119 Type 2 diabetes mellitus without complications: Secondary | ICD-10-CM | POA: Diagnosis not present

## 2020-05-07 DIAGNOSIS — Z881 Allergy status to other antibiotic agents status: Secondary | ICD-10-CM | POA: Diagnosis not present

## 2020-05-07 DIAGNOSIS — Z01812 Encounter for preprocedural laboratory examination: Secondary | ICD-10-CM | POA: Insufficient documentation

## 2020-05-07 DIAGNOSIS — N882 Stricture and stenosis of cervix uteri: Secondary | ICD-10-CM | POA: Diagnosis present

## 2020-05-07 LAB — BASIC METABOLIC PANEL
Anion gap: 7 (ref 5–15)
BUN: 15 mg/dL (ref 6–20)
CO2: 28 mmol/L (ref 22–32)
Calcium: 9.3 mg/dL (ref 8.9–10.3)
Chloride: 102 mmol/L (ref 98–111)
Creatinine, Ser: 0.72 mg/dL (ref 0.44–1.00)
GFR, Estimated: >60 mL/min (ref >60)
Glucose, Bld: 172 mg/dL — ABNORMAL HIGH (ref 70–99)
Potassium: 4.2 mmol/L (ref 3.5–5.1)
Sodium: 137 mmol/L (ref 135–145)

## 2020-05-07 LAB — TYPE AND SCREEN
ABO/RH(D): B POS
Antibody Screen: NEGATIVE

## 2020-05-07 LAB — SARS CORONAVIRUS 2 (TAT 6-24 HRS): SARS Coronavirus 2: NEGATIVE

## 2020-05-09 ENCOUNTER — Encounter (HOSPITAL_BASED_OUTPATIENT_CLINIC_OR_DEPARTMENT_OTHER)
Admission: RE | Disposition: A | Discharge status: Home / Self Care | Payer: Self-pay | Source: Ambulatory Visit | Attending: Obstetrics and Gynecology

## 2020-05-09 ENCOUNTER — Ambulatory Visit (HOSPITAL_BASED_OUTPATIENT_CLINIC_OR_DEPARTMENT_OTHER): Payer: BC Managed Care – PPO | Admitting: Certified Registered"

## 2020-05-09 ENCOUNTER — Encounter (HOSPITAL_BASED_OUTPATIENT_CLINIC_OR_DEPARTMENT_OTHER): Payer: Self-pay | Admitting: Obstetrics and Gynecology

## 2020-05-09 ENCOUNTER — Other Ambulatory Visit: Payer: Self-pay

## 2020-05-09 ENCOUNTER — Ambulatory Visit (HOSPITAL_BASED_OUTPATIENT_CLINIC_OR_DEPARTMENT_OTHER)
Admission: RE | Admit: 2020-05-09 | Discharge: 2020-05-09 | Disposition: A | Discharge status: Home / Self Care | Payer: BC Managed Care – PPO | Source: Ambulatory Visit | Attending: Obstetrics and Gynecology | Admitting: Obstetrics and Gynecology

## 2020-05-09 DIAGNOSIS — Z881 Allergy status to other antibiotic agents status: Secondary | ICD-10-CM | POA: Insufficient documentation

## 2020-05-09 DIAGNOSIS — N870 Mild cervical dysplasia: Secondary | ICD-10-CM | POA: Insufficient documentation

## 2020-05-09 DIAGNOSIS — R209 Unspecified disturbances of skin sensation: Secondary | ICD-10-CM | POA: Insufficient documentation

## 2020-05-09 DIAGNOSIS — R87611 Atypical squamous cells cannot exclude high grade squamous intraepithelial lesion on cytologic smear of cervix (ASC-H): Secondary | ICD-10-CM

## 2020-05-09 DIAGNOSIS — Z20822 Contact with and (suspected) exposure to covid-19: Secondary | ICD-10-CM | POA: Insufficient documentation

## 2020-05-09 DIAGNOSIS — N882 Stricture and stenosis of cervix uteri: Secondary | ICD-10-CM | POA: Insufficient documentation

## 2020-05-09 DIAGNOSIS — E559 Vitamin D deficiency, unspecified: Secondary | ICD-10-CM | POA: Insufficient documentation

## 2020-05-09 DIAGNOSIS — H6092 Unspecified otitis externa, left ear: Secondary | ICD-10-CM | POA: Insufficient documentation

## 2020-05-09 DIAGNOSIS — R85611 Atypical squamous cells cannot exclude high grade squamous intraepithelial lesion on cytologic smear of anus (ASC-H): Secondary | ICD-10-CM

## 2020-05-09 DIAGNOSIS — E669 Obesity, unspecified: Secondary | ICD-10-CM | POA: Insufficient documentation

## 2020-05-09 DIAGNOSIS — I10 Essential (primary) hypertension: Secondary | ICD-10-CM | POA: Insufficient documentation

## 2020-05-09 DIAGNOSIS — N951 Menopausal and female climacteric states: Secondary | ICD-10-CM | POA: Insufficient documentation

## 2020-05-09 DIAGNOSIS — N941 Unspecified dyspareunia: Secondary | ICD-10-CM | POA: Insufficient documentation

## 2020-05-09 DIAGNOSIS — M8580 Other specified disorders of bone density and structure, unspecified site: Secondary | ICD-10-CM | POA: Insufficient documentation

## 2020-05-09 DIAGNOSIS — Z01810 Encounter for preprocedural cardiovascular examination: Secondary | ICD-10-CM

## 2020-05-09 DIAGNOSIS — R439 Unspecified disturbances of smell and taste: Secondary | ICD-10-CM | POA: Insufficient documentation

## 2020-05-09 DIAGNOSIS — G4733 Obstructive sleep apnea (adult) (pediatric): Secondary | ICD-10-CM | POA: Insufficient documentation

## 2020-05-09 DIAGNOSIS — E782 Mixed hyperlipidemia: Secondary | ICD-10-CM | POA: Insufficient documentation

## 2020-05-09 DIAGNOSIS — J309 Allergic rhinitis, unspecified: Secondary | ICD-10-CM | POA: Insufficient documentation

## 2020-05-09 DIAGNOSIS — N898 Other specified noninflammatory disorders of vagina: Secondary | ICD-10-CM | POA: Insufficient documentation

## 2020-05-09 DIAGNOSIS — F325 Major depressive disorder, single episode, in full remission: Secondary | ICD-10-CM | POA: Insufficient documentation

## 2020-05-09 DIAGNOSIS — E119 Type 2 diabetes mellitus without complications: Secondary | ICD-10-CM | POA: Insufficient documentation

## 2020-05-09 DIAGNOSIS — K219 Gastro-esophageal reflux disease without esophagitis: Secondary | ICD-10-CM | POA: Insufficient documentation

## 2020-05-09 HISTORY — DX: Gastro-esophageal reflux disease without esophagitis: K21.9

## 2020-05-09 HISTORY — PX: DILATION AND CURETTAGE OF UTERUS: SHX78

## 2020-05-09 HISTORY — DX: Sleep apnea, unspecified: G47.30

## 2020-05-09 LAB — ABO/RH: ABO/RH(D): B POS

## 2020-05-09 LAB — GLUCOSE, CAPILLARY
Glucose-Capillary: 125 mg/dL — ABNORMAL HIGH (ref 70–99)
Glucose-Capillary: 89 mg/dL (ref 70–99)

## 2020-05-09 SURGERY — DILATION AND CURETTAGE
Anesthesia: General | Site: Vagina

## 2020-05-09 MED ORDER — DEXAMETHASONE SODIUM PHOSPHATE 4 MG/ML IJ SOLN
INTRAMUSCULAR | Status: DC | PRN
  Administered 2020-05-09: 8 mg via INTRAVENOUS

## 2020-05-09 MED ORDER — PROPOFOL 10 MG/ML IV BOLUS
INTRAVENOUS | Status: DC | PRN
  Administered 2020-05-09: 200 mg via INTRAVENOUS

## 2020-05-09 MED ORDER — MIDAZOLAM HCL 2 MG/2ML IJ SOLN
INTRAMUSCULAR | Status: AC
  Filled 2020-05-09: qty 2

## 2020-05-09 MED ORDER — LACTATED RINGERS IV SOLN: INTRAVENOUS | Status: DC

## 2020-05-09 MED ORDER — LIDOCAINE HCL (CARDIAC) PF 100 MG/5ML IV SOSY
PREFILLED_SYRINGE | INTRAVENOUS | Status: DC | PRN
  Administered 2020-05-09: 90 mg via INTRAVENOUS

## 2020-05-09 MED ORDER — FENTANYL CITRATE (PF) 100 MCG/2ML IJ SOLN
INTRAMUSCULAR | Status: DC | PRN
  Administered 2020-05-09 (×2): 50 ug via INTRAVENOUS

## 2020-05-09 MED ORDER — GLYCOPYRROLATE PF 0.2 MG/ML IJ SOSY
PREFILLED_SYRINGE | INTRAMUSCULAR | Status: AC
  Filled 2020-05-09: qty 1

## 2020-05-09 MED ORDER — SILVER NITRATE-POT NITRATE 75-25 % EX MISC
CUTANEOUS | Status: DC | PRN
  Administered 2020-05-09: 1 via TOPICAL

## 2020-05-09 MED ORDER — MIDAZOLAM HCL 5 MG/5ML IJ SOLN
INTRAMUSCULAR | Status: DC | PRN
  Administered 2020-05-09: 2 mg via INTRAVENOUS

## 2020-05-09 MED ORDER — FENTANYL CITRATE (PF) 100 MCG/2ML IJ SOLN
INTRAMUSCULAR | Status: AC
  Filled 2020-05-09: qty 2

## 2020-05-09 MED ORDER — SUCCINYLCHOLINE CHLORIDE 200 MG/10ML IV SOSY
PREFILLED_SYRINGE | INTRAVENOUS | Status: AC
  Filled 2020-05-09: qty 10

## 2020-05-09 MED ORDER — ACETAMINOPHEN 500 MG PO TABS: 1000.0000 mg | ORAL_TABLET | Freq: Once | ORAL | Status: DC

## 2020-05-09 MED ORDER — ONDANSETRON HCL 4 MG/2ML IJ SOLN
INTRAMUSCULAR | Status: AC
  Filled 2020-05-09: qty 6

## 2020-05-09 MED ORDER — ONDANSETRON HCL 4 MG/2ML IJ SOLN
INTRAMUSCULAR | Status: DC | PRN
  Administered 2020-05-09: 4 mg via INTRAVENOUS

## 2020-05-09 SURGICAL SUPPLY — 13 items
CATH ROBINSON RED A/P 16FR (CATHETERS) IMPLANT
CNTNR URN SCR LID CUP LEK RST (MISCELLANEOUS) IMPLANT
CONT SPEC 4OZ STRL OR WHT (MISCELLANEOUS)
GLOVE SURG ENC MOIS LTX SZ6.5 (GLOVE) ×4 IMPLANT
GLOVE SURG UNDER POLY LF SZ6.5 (GLOVE) ×2 IMPLANT
GLOVE SURG UNDER POLY LF SZ7 (GLOVE) ×2 IMPLANT
GOWN STRL REUS W/ TWL LRG LVL3 (GOWN DISPOSABLE) ×3 IMPLANT
GOWN STRL REUS W/TWL LRG LVL3 (GOWN DISPOSABLE) ×6
KIT TURNOVER KIT B (KITS) IMPLANT
PACK VAGINAL MINOR WOMEN LF (CUSTOM PROCEDURE TRAY) ×2 IMPLANT
PAD OB MATERNITY 4.3X12.25 (PERSONAL CARE ITEMS) ×2 IMPLANT
TOWEL GREEN STERILE FF (TOWEL DISPOSABLE) ×4 IMPLANT
UNDERPAD 30X36 HEAVY ABSORB (UNDERPADS AND DIAPERS) ×2 IMPLANT

## 2020-05-09 NOTE — Transfer of Care (Signed)
Immediate Anesthesia Transfer of Care Note  Patient: [name]  Procedure(s) Performed: CERVICAL DILATATION AND ENDOCERVICAL CURETTAGE (N/A Vagina )  Patient Location: PACU  Anesthesia Type:General  Level of Consciousness: sedated  Airway & Oxygen Therapy: Patient Spontanous Breathing and Patient connected to face mask oxygen  Post-op Assessment: Report given to RN and Post -op Vital signs reviewed and stable  Post vital signs: Reviewed and stable  Last Vitals:  Vitals Value Taken Time  BP    Temp 37.1 C 05/09/20 1515  Pulse 67 05/09/20 1515  Resp 14 05/09/20 1515  SpO2 95 % 05/09/20 1515  Vitals shown include unvalidated device data.  Last Pain:  Vitals:   05/09/20 1230  TempSrc: Oral  PainSc: 4       Patients Stated Pain Goal: 3 (22/63/33 5456)  Complications: No complications documented.

## 2020-05-09 NOTE — Discharge Instructions (Signed)

## 2020-05-09 NOTE — Op Note (Signed)
Pre Op Dx:   1. ASC-H pap smear of the cervix 2. History of LEEP 3. Cervical stenosis Post Op Dx:   Same as pre-op Procedure:  Cervical dilation and Endocervical curettage   Surgeon:  Dr. Drema Dallas Assistants:  None Anesthesia:  General   EBL:  5cc  IVF:  See anesthesia documentation UOP:  Voided prior to case   Drains:  Foley catheter Specimen removed:  Endocervical curettage - sent to pathology Device(s) implanted: None Case Type:  Clean-contaminated Findings:  Normal-appearing cervix, stenosis of cervical os Complications: None Indications:  60 y.o. female with ASC-H pap smear of the cervix and inability to obtain endocervical sampling outpatient due to cervical intolerance and poor intolerance of exam. Description of each procedure:  After informed consent was obtained, patient was brought to the operating room in dorsal supine position. After administration of general anesthesia, she was placed in the dorsal lithotomy position. She was prepped and draped in the usual sterile fashion. The Cytotec pill was removed from the vagina during prep. A pre-operative time-out was performed. A bivalve speculum was used to visualize the cervix. A single-tooth tenaculum was placed on the anterior lip of the cervix. The cervix was serially dilated with very small dilators. Endocervical curettage was performed without difficulty. The single-tooth tenaculum was removed and its sites were made hemostatic using silver nitrate. Hemostasis noted. All counts were correct.  The patient was awakened and extubated in the operating room having appeared to have tolerated the procedure well. Disposition:  PACU  Drema Dallas, DO

## 2020-05-09 NOTE — Anesthesia Preprocedure Evaluation (Addendum)
Anesthesia Evaluation  Patient identified by MRN, date of birth, ID band Patient awake    Reviewed: Allergy & Precautions, NPO status , Patient's Chart, lab work & pertinent test results  Airway Mallampati: II  TM Distance: >3 FB Neck ROM: Full    Dental  (+) Dental Advisory Given   Pulmonary sleep apnea ,    Pulmonary exam normal        Cardiovascular hypertension, Pt. on medications Normal cardiovascular exam Rhythm:Regular     Neuro/Psych PSYCHIATRIC DISORDERS Depression negative neurological ROS     GI/Hepatic Neg liver ROS, GERD  ,  Endo/Other  diabetes  Renal/GU negative Renal ROS     Musculoskeletal negative musculoskeletal ROS (+)   Abdominal (+) + obese,   Peds  Hematology negative hematology ROS (+)   Anesthesia Other Findings   Reproductive/Obstetrics negative OB ROS                            Anesthesia Physical  Anesthesia Plan  ASA: II  Anesthesia Plan: General   Post-op Pain Management:    Induction: Intravenous  PONV Risk Score and Plan: 4 or greater and Ondansetron, Dexamethasone, Scopolamine patch - Pre-op, Diphenhydramine, Midazolam and Treatment may vary due to age or medical condition  Airway Management Planned: LMA  Additional Equipment: None  Intra-op Plan:   Post-operative Plan: Extubation in OR  Informed Consent: I have reviewed the patients History and Physical, chart, labs and discussed the procedure including the risks, benefits and alternatives for the proposed anesthesia with the patient or authorized representative who has indicated his/her understanding and acceptance.     Dental advisory given  Plan Discussed with: CRNA  Anesthesia Plan Comments:        Anesthesia Quick Evaluation

## 2020-05-09 NOTE — Anesthesia Postprocedure Evaluation (Signed)
Anesthesia Post Note  Patient: [name]  Procedure(s) Performed: CERVICAL DILATATION AND ENDOCERVICAL CURETTAGE (N/A Vagina )     Patient location during evaluation: PACU Anesthesia Type: General Level of consciousness: sedated and patient cooperative Pain management: pain level controlled Vital Signs Assessment: post-procedure vital signs reviewed and stable Respiratory status: spontaneous breathing Cardiovascular status: stable Anesthetic complications: no   No complications documented.  Last Vitals:  Vitals:   05/09/20 1545 05/09/20 1615  BP: 136/76 140/82  Pulse: 83 76  Resp: 17 16  Temp:  37 C  SpO2: 97% 95%    Last Pain:  Vitals:   05/09/20 1615  TempSrc:   PainSc: 0-No pain                 Nolon Nations

## 2020-05-09 NOTE — Interval H&P Note (Signed)
History and Physical Interval Note:  05/09/2020 12:56 PM  [name]  has presented today for surgery, with the diagnosis of Atypical Squamous Cells (R87.611) Cervical Stenosis (N88.2).  The various methods of treatment have been discussed with the patient and family. After consideration of risks, benefits and other options for treatment, the patient has consented to  Procedure(s): CERVICAL DILATATION AND ENDOCERVICAL CURETTAGE (N/A) as a surgical intervention.  The patient's history has been reviewed, patient examined, no change in status, stable for surgery.  I have reviewed the patient's chart and labs.  Questions were answered to the patient's satisfaction.     Drema Dallas

## 2020-05-09 NOTE — Anesthesia Procedure Notes (Signed)
Procedure Name: LMA Insertion Performed by: Jemarion Roycroft, [place], CRNA Pre-anesthesia Checklist: Patient identified, Emergency Drugs available, Suction available and Patient being monitored Patient Re-evaluated:Patient Re-evaluated prior to induction Oxygen Delivery Method: Circle system utilized Preoxygenation: Pre-oxygenation with 100% oxygen Induction Type: IV induction Ventilation: Mask ventilation without difficulty LMA: LMA inserted LMA Size: 4.0 Number of attempts: 1 Airway Equipment and Method: Bite block Placement Confirmation: positive ETCO2 Tube secured with: Tape Dental Injury: Teeth and Oropharynx as per pre-operative assessment        

## 2020-05-10 ENCOUNTER — Encounter (HOSPITAL_BASED_OUTPATIENT_CLINIC_OR_DEPARTMENT_OTHER): Payer: Self-pay | Admitting: Obstetrics and Gynecology

## 2020-05-10 LAB — SURGICAL PATHOLOGY

## 2020-05-28 ENCOUNTER — Other Ambulatory Visit: Payer: Self-pay

## 2020-05-28 ENCOUNTER — Other Ambulatory Visit (HOSPITAL_BASED_OUTPATIENT_CLINIC_OR_DEPARTMENT_OTHER): Payer: Self-pay

## 2020-05-28 ENCOUNTER — Ambulatory Visit: Payer: BC Managed Care – PPO | Attending: Internal Medicine

## 2020-05-28 DIAGNOSIS — Z23 Encounter for immunization: Secondary | ICD-10-CM

## 2020-05-28 MED ORDER — PFIZER-BIONT COVID-19 VAC-TRIS 30 MCG/0.3ML IM SUSP
INTRAMUSCULAR | 0 refills | Status: DC
Start: 2020-05-28 — End: 2020-06-11
  Filled 2020-05-28: qty 0.3, 1d supply, fill #0

## 2020-05-28 NOTE — Progress Notes (Signed)
   Covid-19 Vaccination Clinic  Name:  [name]    MRN: [id number] DOB: 10/06/1960  05/28/2020  [name] was observed post Covid-19 immunization for 15 minutes without incident. She was provided with Vaccine Information Sheet and instruction to access the V-Safe system.   [name] was instructed to call 911 with any severe reactions post vaccine: Marland Kitchen Difficulty breathing  . Swelling of face and throat  . A fast heartbeat  . A bad rash all over body  . Dizziness and weakness   Immunizations Administered    Name Date Dose VIS Date Route   PFIZER Comrnaty(Gray TOP) Covid-19 Vaccine 05/28/2020 11:02 AM 0.3 mL 12/29/2019 Intramuscular   Manufacturer: [employer], [employer]   Lot: [id number]   NDC: [phone]

## 2020-06-09 ENCOUNTER — Other Ambulatory Visit: Payer: Self-pay

## 2020-06-11 ENCOUNTER — Ambulatory Visit: Payer: BC Managed Care – PPO | Admitting: Internal Medicine

## 2020-06-11 ENCOUNTER — Encounter: Payer: Self-pay | Admitting: Internal Medicine

## 2020-06-11 ENCOUNTER — Other Ambulatory Visit: Payer: Self-pay

## 2020-06-11 VITALS — BP 126/86 | HR 74 | Temp 98.1°F | Ht 66.0 in | Wt 199.0 lb

## 2020-06-11 DIAGNOSIS — Z23 Encounter for immunization: Secondary | ICD-10-CM

## 2020-06-11 DIAGNOSIS — E1142 Type 2 diabetes mellitus with diabetic polyneuropathy: Secondary | ICD-10-CM

## 2020-06-11 DIAGNOSIS — E785 Hyperlipidemia, unspecified: Secondary | ICD-10-CM

## 2020-06-11 DIAGNOSIS — I10 Essential (primary) hypertension: Secondary | ICD-10-CM

## 2020-06-11 DIAGNOSIS — E119 Type 2 diabetes mellitus without complications: Secondary | ICD-10-CM | POA: Insufficient documentation

## 2020-06-11 DIAGNOSIS — E118 Type 2 diabetes mellitus with unspecified complications: Secondary | ICD-10-CM | POA: Diagnosis not present

## 2020-06-11 LAB — POCT GLYCOSYLATED HEMOGLOBIN (HGB A1C): Hemoglobin A1C: 7.9 % — AB (ref 4.0–5.6)

## 2020-06-11 MED ORDER — PREGABALIN 25 MG PO CAPS: 25.0000 mg | ORAL_CAPSULE | Freq: Three times a day (TID) | ORAL | 0 refills | Status: DC

## 2020-06-11 MED ORDER — FREESTYLE LIBRE 2 READER DEVI: 1.0000 | Freq: Every day | 5 refills | Status: DC

## 2020-06-11 MED ORDER — RYBELSUS 3 MG PO TABS: 3.0000 mg | ORAL_TABLET | Freq: Every day | ORAL | 0 refills | Status: AC

## 2020-06-11 MED ORDER — FREESTYLE LIBRE 2 SENSOR MISC: 1.0000 | Freq: Every day | 5 refills | Status: DC

## 2020-06-11 MED ORDER — DAPAGLIFLOZIN PROPANEDIOL 10 MG PO TABS: 10.0000 mg | ORAL_TABLET | Freq: Every day | ORAL | 1 refills | Status: DC

## 2020-06-11 NOTE — Patient Instructions (Signed)
Type 2 Diabetes Mellitus, Diagnosis, Adult Type 2 diabetes (type 2 diabetes mellitus) is a long-term, or chronic, disease. In type 2 diabetes, one or both of these problems may be present:  The pancreas does not make enough of a hormone called insulin.  Cells in the body do not respond properly to insulin that the body makes (insulin resistance). Normally, insulin allows blood sugar (glucose) to enter cells in the body. The cells use glucose for energy. Insulin resistance or lack of insulin causes excess glucose to build up in the blood instead of going into cells. This causes high blood glucose (hyperglycemia).  What are the causes? The exact cause of type 2 diabetes is not known. What increases the risk? The following factors may make you more likely to develop this condition:  Having a family member with type 2 diabetes.  Being overweight or obese.  Being inactive (sedentary).  Having been diagnosed with insulin resistance.  Having a history of prediabetes, diabetes when you were pregnant (gestational diabetes), or polycystic ovary syndrome (PCOS). What are the signs or symptoms? In the early stage of this condition, you may not have symptoms. Symptoms develop slowly and may include:  Increased thirst or hunger.  Increased urination.  Unexplained weight loss.  Tiredness (fatigue) or weakness.  Vision changes, such as blurry vision.  Dark patches on the skin. How is this diagnosed? This condition is diagnosed based on your symptoms, your medical history, a physical exam, and your blood glucose level. Your blood glucose may be checked with one or more of the following blood tests:  A fasting blood glucose (FBG) test. You will not be allowed to eat (you will fast) for 8 hours or longer before a blood sample is taken.  A random blood glucose test. This test checks blood glucose at any time of day regardless of when you ate.  An A1C (hemoglobin A1C) blood test. This test  provides information about blood glucose levels over the previous 2-3 months.  An oral glucose tolerance test (OGTT). This test measures your blood glucose at two times: ? After fasting. This is your baseline blood glucose level. ? Two hours after drinking a beverage that contains glucose. You may be diagnosed with type 2 diabetes if:  Your fasting blood glucose level is 126 mg/dL (7.0 mmol/L) or higher.  Your random blood glucose level is 200 mg/dL (11.1 mmol/L) or higher.  Your A1C level is 6.5% or higher.  Your oral glucose tolerance test result is higher than 200 mg/dL (11.1 mmol/L). These blood tests may be repeated to confirm your diagnosis.   How is this treated? Your treatment may be managed by a specialist called an endocrinologist. Type 2 diabetes may be treated by following instructions from your health care provider about:  Making dietary and lifestyle changes. These may include: ? Following a personalized nutrition plan that is developed by a registered dietitian. ? Exercising regularly. ? Finding ways to manage stress.  Checking your blood glucose level as often as told.  Taking diabetes medicines or insulin daily. This helps to keep your blood glucose levels in the healthy range.  Taking medicines to help prevent complications from diabetes. Medicines may include: ? Aspirin. ? Medicine to lower cholesterol. ? Medicine to control blood pressure. Your health care provider will set treatment goals for you. Your goals will be based on your age, other medical conditions you have, and how you respond to diabetes treatment. Generally, the goal of treatment is to maintain the   following blood glucose levels:  Before meals: 80-130 mg/dL (4.4-7.2 mmol/L).  After meals: below 180 mg/dL (10 mmol/L).  A1C level: less than 7%. Follow these instructions at home: Questions to ask your health care provider Consider asking the following questions:  Should I meet with a certified  diabetes care and education specialist?  What diabetes medicines do I need, and when should I take them?  What equipment will I need to manage my diabetes at home?  How often do I need to check my blood glucose?  Where can I find a support group for people with diabetes?  What number can I call if I have questions?  When is my next appointment? General instructions  Take over-the-counter and prescription medicines only as told by your health care provider.  Keep all follow-up visits as told by your health care provider. This is important. Where to find more information  American Diabetes Association (ADA): www.diabetes.org  American Association of Diabetes Care and Education Specialists (ADCES): www.diabeteseducator.org  International Diabetes Federation (IDF): www.idf.org Contact a health care provider if:  Your blood glucose is at or above 240 mg/dL (13.3 mmol/L) for 2 days in a row.  You have been sick or have had a fever for 2 days or longer, and you are not getting better.  You have any of the following problems for more than 6 hours: ? You cannot eat or drink. ? You have nausea and vomiting. ? You have diarrhea. Get help right away if:  You have severe hypoglycemia. This means your blood glucose is lower than 54 mg/dL (3.0 mmol/L).  You become confused or you have trouble thinking clearly.  You have difficulty breathing.  You have moderate or large ketone levels in your urine. These symptoms may represent a serious problem that is an emergency. Do not wait to see if the symptoms will go away. Get medical help right away. Call your local emergency services (911 in the [place] Do not drive yourself to the hospital. Summary  Type 2 diabetes (type 2 diabetes mellitus) is a long-term, or chronic, disease. In type 2 diabetes, the pancreas does not make enough of a hormone called insulin, or cells in the body do not respond properly to insulin that the body makes (insulin  resistance).  This condition is treated by making dietary and lifestyle changes and taking diabetes medicines or insulin.  Your health care provider will set treatment goals for you. Your goals will be based on your age, other medical conditions you have, and how you respond to diabetes treatment.  Keep all follow-up visits as told by your health care provider. This is important. This information is not intended to replace advice given to you by your health care provider. Make sure you discuss any questions you have with your health care provider. Document Revised: 08/03/2019 Document Reviewed: 08/03/2019 Elsevier Patient Education  2021 Elsevier Inc.  

## 2020-06-11 NOTE — Progress Notes (Signed)
Subjective:  Patient ID: [name], female    DOB: Feb 08, 1960  Age: 60 y.o. MRN: [id number]  CC: Diabetes  This visit occurred during the SARS-CoV-2 public health emergency.  Safety protocols were in place, including screening questions prior to the visit, additional usage of staff PPE, and extensive cleaning of exam room while observing appropriate contact time as indicated for disinfecting solutions.    HPI [name] presents for f/up and to establish.  She complains of a 51-month history of burning and prickling sensation in her lower extremities below the knees.  She denies numbness, weakness, or tingling.  She has a history of type 2 diabetes mellitus and is being treated with a sulfonylurea.  She complains that her blood sugars are not well controlled and she has had a few episodes of symptomatic hypoglycemia.  She walks about 3 miles a day and does not experience CP, DOE, palpitations, edema, or fatigue.  History Hila has a past medical history of Depression, Diabetes mellitus without complication ([place]), GERD (gastroesophageal reflux disease), Hypertension, and Sleep apnea.   She has a past surgical history that includes Diagnostic laparoscopy; Labioplasty (Left, 08/10/2017); and Dilation and curettage of uterus (N/A, 05/09/2020).   Her family history is not on file.She reports that she has never smoked. She has never used smokeless tobacco. She reports current alcohol use. She reports that she does not use drugs.  Outpatient Medications Prior to Visit  Medication Sig Dispense Refill  . Atorvastatin Calcium (LIPITOR PO) Take by mouth.    Marland Kitchen buPROPion (WELLBUTRIN XL) 300 MG 24 hr tablet Take 300 mg by mouth daily.  1  . cetirizine (ZYRTEC) 10 MG tablet Take 10 mg by mouth daily.    . citalopram (CELEXA) 40 MG tablet Take 40 mg by mouth daily.  0  . Cyanocobalamin (VITAMIN B12) 1000 MCG TBCR Take by mouth.    Marland Kitchen glucosamine-chondroitin 500-400 MG tablet Take by mouth 3  (three) times daily.    . pantoprazole (PROTONIX) 40 MG tablet Take 40 mg by mouth daily.  1  . ramipril (ALTACE) 5 MG capsule Take 5 mg by mouth daily.  1  . VITAMIN D PO Take 5,000 Units by mouth.    . calcium-vitamin D 250-100 MG-UNIT tablet Take 1 tablet by mouth 2 (two) times daily.    Marland Kitchen COVID-19 mRNA Vac-TriS, Pfizer, (PFIZER-BIONT COVID-19 VAC-TRIS) SUSP injection Inject into the muscle. 0.3 mL 0  . COVID-19 mRNA vaccine, Pfizer, 30 MCG/0.3ML injection INJECT AS DIRECTED .3 mL 0  . glimepiride (AMARYL) 1 MG tablet Take 1 mg by mouth 2 (two) times daily.    Marland Kitchen ibuprofen (ADVIL,MOTRIN) 200 MG tablet Take 3 tablets (600 mg total) by mouth every 6 (six) hours as needed. 30 tablet 0   No facility-administered medications prior to visit.    ROS Review of Systems  Constitutional: Negative for diaphoresis, fatigue and unexpected weight change.  HENT: Negative.   Eyes: Negative.  Negative for visual disturbance.  Respiratory: Negative for cough, chest tightness, shortness of breath and wheezing.   Cardiovascular: Negative for chest pain, palpitations and leg swelling.  Gastrointestinal: Negative for abdominal pain, constipation, diarrhea, nausea and vomiting.  Endocrine: Negative.  Negative for polydipsia, polyphagia and polyuria.  Genitourinary: Negative.  Negative for difficulty urinating.  Musculoskeletal: Negative for arthralgias, myalgias and neck pain.  Skin: Negative.  Negative for rash.  Neurological: Negative for dizziness, weakness, light-headedness, numbness and headaches.  Hematological: Negative.  Negative for adenopathy. Does not  bruise/bleed easily.  Psychiatric/Behavioral: Negative.     Objective:  BP 126/86 (BP Location: Left Arm, Patient Position: Sitting, Cuff Size: Large)   Pulse 74   Temp 98.1 F (36.7 C) (Oral)   Ht 5\' 6"  (1.676 m)   Wt 199 lb (90.3 kg)   LMP 02/02/2013   SpO2 96%   BMI 32.12 kg/m   Physical Exam Vitals reviewed.  Constitutional:       Appearance: She is obese.  HENT:     Nose: Nose normal.     Mouth/Throat:     Mouth: Mucous membranes are moist.  Eyes:     General: No scleral icterus.    Conjunctiva/sclera: Conjunctivae normal.  Cardiovascular:     Rate and Rhythm: Normal rate and regular rhythm.     Heart sounds: No murmur heard.   Pulmonary:     Effort: Pulmonary effort is normal.     Breath sounds: No stridor. No wheezing, rhonchi or rales.  Abdominal:     General: Abdomen is protuberant. Bowel sounds are normal. There is no distension.     Palpations: Abdomen is soft. There is no hepatomegaly, splenomegaly or mass.     Tenderness: There is no abdominal tenderness.  Musculoskeletal:        General: Normal range of motion.     Cervical back: Neck supple.     Right lower leg: No edema.     Left lower leg: No edema.  Lymphadenopathy:     Cervical: No cervical adenopathy.  Skin:    General: Skin is warm and dry.     Coloration: Skin is not pale.  Neurological:     General: No focal deficit present.     Mental Status: She is alert and oriented to person, place, and time. Mental status is at baseline.     Cranial Nerves: Cranial nerves are intact.     Sensory: Sensation is intact.     Motor: Motor function is intact. No weakness.     Coordination: Coordination is intact.     Gait: Gait is intact. Gait normal.     Deep Tendon Reflexes: Reflexes normal.     Reflex Scores:      Tricep reflexes are 0 on the right side and 0 on the left side.      Bicep reflexes are 0 on the right side and 0 on the left side.      Brachioradialis reflexes are 1+ on the right side and 1+ on the left side.      Patellar reflexes are 1+ on the right side and 1+ on the left side.      Achilles reflexes are 0 on the right side and 0 on the left side.     Assessment & Plan:   Arloa was seen today for diabetes.  Diagnoses and all orders for this visit:  Diabetic polyneuropathy associated with type 2 diabetes mellitus ([place])-  Will treat this with pregabalin. -     Semaglutide (RYBELSUS) 3 MG TABS; Take 3 mg by mouth daily. -     dapagliflozin propanediol (FARXIGA) 10 MG TABS tablet; Take 1 tablet (10 mg total) by mouth daily before breakfast. -     pregabalin (LYRICA) 25 MG capsule; Take 1 capsule (25 mg total) by mouth 3 (three) times daily. -     Continuous Blood Gluc Sensor (FREESTYLE LIBRE 2 SENSOR) MISC; 1 Act by Does not apply route daily. -     Continuous Blood Gluc Receiver (  FREESTYLE LIBRE 2 READER) DEVI; 1 Act by Does not apply route daily.  Type II diabetes mellitus with manifestations ([place])- Her A1C is up tp 7.9%.  I recommended that she discontinue the SU and start taking a GLP-1 agonist and an SGLT2 inhibitor.  She is intolerant to metformin. -     Pneumococcal conjugate vaccine 20-valent -     POCT glycosylated hemoglobin (Hb A1C) -     Semaglutide (RYBELSUS) 3 MG TABS; Take 3 mg by mouth daily. -     dapagliflozin propanediol (FARXIGA) 10 MG TABS tablet; Take 1 tablet (10 mg total) by mouth daily before breakfast. -     Continuous Blood Gluc Sensor (FREESTYLE LIBRE 2 SENSOR) MISC; 1 Act by Does not apply route daily. -     Continuous Blood Gluc Receiver (FREESTYLE LIBRE 2 READER) DEVI; 1 Act by Does not apply route daily.  Primary hypertension- Her blood pressure is adequately well controlled.  Hyperlipidemia with target LDL less than 100- She has achieved her LDL goal and is doing well on the statin.  Other orders -     Varicella-zoster vaccine IM (Shingrix)   I have discontinued Dorann L. Schiraldi's glimepiride, ibuprofen, COVID-19 mRNA vaccine (Pfizer), calcium-vitamin D, and Pfizer-BioNT COVID-19 Vac-TriS. I am also having her start on Rybelsus, dapagliflozin propanediol, pregabalin, FreeStyle Libre 2 Sensor, and [employer] 2 Reader. Additionally, I am having her maintain her buPROPion, citalopram, pantoprazole, ramipril, cetirizine, Atorvastatin Calcium (LIPITOR PO), glucosamine-chondroitin,  Vitamin B12, and VITAMIN D PO.  Meds ordered this encounter  Medications  . Semaglutide (RYBELSUS) 3 MG TABS    Sig: Take 3 mg by mouth daily.    Dispense:  30 tablet    Refill:  0    <SAMPLE>  . dapagliflozin propanediol (FARXIGA) 10 MG TABS tablet    Sig: Take 1 tablet (10 mg total) by mouth daily before breakfast.    Dispense:  90 tablet    Refill:  1  . pregabalin (LYRICA) 25 MG capsule    Sig: Take 1 capsule (25 mg total) by mouth 3 (three) times daily.    Dispense:  270 capsule    Refill:  0  . Continuous Blood Gluc Sensor (FREESTYLE LIBRE 2 SENSOR) MISC    Sig: 1 Act by Does not apply route daily.    Dispense:  2 each    Refill:  5  . Continuous Blood Gluc Receiver (FREESTYLE LIBRE 2 READER) DEVI    Sig: 1 Act by Does not apply route daily.    Dispense:  2 each    Refill:  5     Follow-up: Return in about 3 months (around 09/11/2020).  [name], MD

## 2020-07-10 ENCOUNTER — Telehealth: Payer: Self-pay | Admitting: Internal Medicine

## 2020-07-10 NOTE — Telephone Encounter (Signed)
Patient calling with questions regarding monitoring her blood sugar She has several diabetic education questions When to eat, what is normal range, best time for medication?

## 2020-07-11 ENCOUNTER — Other Ambulatory Visit: Payer: Self-pay | Admitting: Internal Medicine

## 2020-07-11 DIAGNOSIS — E118 Type 2 diabetes mellitus with unspecified complications: Secondary | ICD-10-CM

## 2020-07-17 ENCOUNTER — Other Ambulatory Visit: Payer: Self-pay | Admitting: Internal Medicine

## 2020-07-17 ENCOUNTER — Telehealth: Payer: Self-pay | Admitting: Internal Medicine

## 2020-07-17 DIAGNOSIS — E118 Type 2 diabetes mellitus with unspecified complications: Secondary | ICD-10-CM

## 2020-07-17 MED ORDER — RYBELSUS 7 MG PO TABS: 7.0000 mg | ORAL_TABLET | Freq: Every day | ORAL | 1 refills | Status: DC

## 2020-07-17 NOTE — Telephone Encounter (Signed)
   Patient called and was wondering if a prescription for Rybelsus 7mg  would be sent to [id number] - [place], [place] - [address]. She said that she spoke to Dr. Ronnald Ramp about this at her last OV. Please advise

## 2020-08-14 ENCOUNTER — Other Ambulatory Visit: Payer: Self-pay

## 2020-08-14 ENCOUNTER — Encounter: Payer: BC Managed Care – PPO | Attending: Internal Medicine | Admitting: Dietician

## 2020-08-14 DIAGNOSIS — E118 Type 2 diabetes mellitus with unspecified complications: Secondary | ICD-10-CM | POA: Diagnosis not present

## 2020-08-14 NOTE — Patient Instructions (Addendum)
Look into "Hospital doctor for [employer] on [employer] to cover your sensor.  Look into the "Edmonia Caprio" app on the [employer].  Look for your blood sugar to be under 125 fasting and under 180 2 hours after starting a meal.  Be sure to fuel your body for your exercise with a small carb-rich meal, and have an 8oz glass of [employer] chocolate milk within 20 minutes of finishing your exercise. Use your handouts to give you good examples of what to eat and when to improve athletic performance.  Work towards eating three meals a day, about 5-6 hours apart! Try to move one of your midday snacks to either your lunch or dinner meal.  Begin to build your meals using the proportions of the Balanced Plate. First, select your carb choice(s) for the meal Next, select your source of protein to pair with your carb choice(s). Finally, complete the remaining half of your meal with a variety of non-starchy vegetables.

## 2020-08-14 NOTE — Progress Notes (Signed)
Diabetes Self-Management Education  Visit Type: First/Initial  Appt. Start Time: 1400 Appt. End Time: [id number]  08/14/2020  [name], identified by name and date of birth, is a 60 y.o. female with a diagnosis of Diabetes: Type 2.   ASSESSMENT Pt is currently using Freestyle Libre.  Pt reports an allergic reaction to metformin. Pt is currently taking Rybelsus and Wilder Glade, has been taking for the last 7 weeks. Pt reports their fasting blood sugar has been gradually coming down, started at 140, is now around 120. PPBG ranges between 180-200 Pt reports having some neuropathy that started in their feet and emanated up their legs. Pt states it is on and off, but rare currently. Pt reports walking and swimming for physical activity. Pt is currently swimming 4x a week for ~35 minutes. Pt reports drinking ~80-100 oz. Of water each day. Pt wants education on how to eat to support level of exercise.  Height '5\' 6"'$  (1.676 m), weight 188 lb 8 oz (85.5 kg), last menstrual period 02/02/2013. Body mass index is 30.42 kg/m.   Diabetes Self-Management Education - 08/14/20 1415       Visit Information   Visit Type First/Initial      Initial Visit   Diabetes Type Type 2    Are you currently following a meal plan? No    Are you taking your medications as prescribed? Yes    Date Diagnosed 2016      Health Coping   How would you rate your overall health? Fair      Psychosocial Assessment   Patient Belief/Attitude about Diabetes Motivated to manage diabetes    Self-care barriers None    Self-management support Doctor's office    Other persons present Patient    Patient Concerns Nutrition/Meal planning;Monitoring;Glycemic Control    Special Needs None    Preferred Learning Style No preference indicated    Learning Readiness Ready    How often do you need to have someone help you when you read instructions, pamphlets, or other written materials from your doctor or pharmacy? 1 - Never    What is  the last grade level you completed in school? Bachelors      Pre-Education Assessment   Patient understands the diabetes disease and treatment process. Needs Instruction    Patient understands incorporating nutritional management into lifestyle. Needs Instruction    Patient undertands incorporating physical activity into lifestyle. Needs Instruction    Patient understands using medications safely. Needs Instruction    Patient understands monitoring blood glucose, interpreting and using results Needs Instruction    Patient understands prevention, detection, and treatment of acute complications. Needs Instruction    Patient understands how to develop strategies to address psychosocial issues. Needs Instruction    Patient understands how to develop strategies to promote health/change behavior. Needs Instruction      Complications   Last HgB A1C per patient/outside source 7.9 %   06/11/2020   How often do you check your blood sugar? > 4 times/day   Pt uses Freestlye Libre2   Fasting Blood glucose range (mg/dL) 70-129   Recently went from ~140 to ~120   Postprandial Blood glucose range (mg/dL) 180-200    Number of hypoglycemic episodes per month 3   BG got down to 67-70, 2 times during the night, verified with finger stick   Can you tell when your blood sugar is low? No   Was alerted by Elenor Legato, not symptomatic   Number of hyperglycemic episodes per week 0  Have you had a dilated eye exam in the past 12 months? Yes    Have you had a dental exam in the past 12 months? Yes    Are you checking your feet? Yes    How many days per week are you checking your feet? 2      Dietary Intake   Breakfast Fat-free greek yogurt, piece of toast, coffee    Snack (morning) piece of toast with peanut butter, 4 oz 2% milk, 1/2 banana    Lunch 1/2 Fried chicken wrap, lettuce, tomato, 1/2 bag Sunchips, animal crackers, diet coke    Snack (afternoon) Watermelon, freezie pop    Dinner Tomato soup, tilapia, a few  hushpuppies, glass of 2% milk    Snack (evening) strawberry ice cream    Beverage(s) coffee, milk, diet coke,      Exercise   Exercise Type ADL's;Moderate (swimming / aerobic walking)    How many days per week to you exercise? 4    How many minutes per day do you exercise? 30    Total minutes per week of exercise 120      Patient Education   Previous Diabetes Education Yes (please comment)   Around 2016   Disease state  Factors that contribute to the development of diabetes;Explored patient's options for treatment of their diabetes    Nutrition management  Role of diet in the treatment of diabetes and the relationship between the three main macronutrients and blood glucose level;Meal timing in regards to the patients' current diabetes medication.    Physical activity and exercise  Role of exercise on diabetes management, blood pressure control and cardiac health.;Identified with patient nutritional and/or medication changes necessary with exercise.;Helped patient identify appropriate exercises in relation to his/her diabetes, diabetes complications and other health issue.   Discussed periodization of nutrients to increase performance   Medications Reviewed patients medication for diabetes, action, purpose, timing of dose and side effects.    Monitoring Identified appropriate SMBG and/or A1C goals.    Chronic complications Identified and discussed with patient  current chronic complications;Assessed and discussed foot care and prevention of foot problems;Relationship between chronic complications and blood glucose control    Psychosocial adjustment Role of stress on diabetes;Identified and addressed patients feelings and concerns about diabetes    Personal strategies to promote health Lifestyle issues that need to be addressed for better diabetes care      Individualized Goals (developed by patient)   Nutrition Follow meal plan discussed;Adjust meds/carbs with exercise as discussed    Physical  Activity Exercise 3-5 times per week    Medications take my medication as prescribed    Monitoring  test my blood glucose as discussed      Post-Education Assessment   Patient understands the diabetes disease and treatment process. Needs Review    Patient understands incorporating nutritional management into lifestyle. Needs Review    Patient undertands incorporating physical activity into lifestyle. Needs Review    Patient understands using medications safely. Needs Review    Patient understands monitoring blood glucose, interpreting and using results Needs Review    Patient understands prevention, detection, and treatment of acute complications. Needs Review    Patient understands prevention, detection, and treatment of chronic complications. Needs Review    Patient understands how to develop strategies to address psychosocial issues. Needs Review    Patient understands how to develop strategies to promote health/change behavior. Needs Review      Outcomes   Expected Outcomes Demonstrated interest  in learning. Expect positive outcomes    Future DMSE 2 months    Program Status Not Completed             Individualized Plan for Diabetes Self-Management Training:   Learning Objective:  Patient will have a greater understanding of diabetes self-management. Patient education plan is to attend individual and/or group sessions per assessed needs and concerns.   Plan:   Patient Instructions  Look into "[facility] doctor for [employer] on [employer] to cover your sensor.  Look into the "Edmonia Caprio" app on the [employer].  Look for your blood sugar to be under 125 fasting and under 180 2 hours after starting a meal.  Be sure to fuel your body for your exercise with a small carb-rich meal, and have an 8oz glass of Fairlife chocolate milk within 20 minutes of finishing your exercise. Use your handouts to give you good examples of what to eat and when to improve athletic  performance.  Work towards eating three meals a day, about 5-6 hours apart! Try to move one of your midday snacks to either your lunch or dinner meal.  Begin to build your meals using the proportions of the Balanced Plate. First, select your carb choice(s) for the meal Next, select your source of protein to pair with your carb choice(s). Finally, complete the remaining half of your meal with a variety of non-starchy vegetables.   Expected Outcomes:  Demonstrated interest in learning. Expect positive outcomes  Education material provided: My Plate, Eating Before/After Exercise  If problems or questions, patient to contact team via:  Phone and Email  Future DSME appointment: 2 months

## 2020-09-10 ENCOUNTER — Other Ambulatory Visit: Payer: Self-pay

## 2020-09-10 ENCOUNTER — Ambulatory Visit: Payer: BC Managed Care – PPO | Admitting: Internal Medicine

## 2020-09-10 ENCOUNTER — Encounter: Payer: Self-pay | Admitting: Internal Medicine

## 2020-09-10 VITALS — BP 122/86 | HR 78 | Temp 98.4°F | Resp 16 | Ht 66.0 in | Wt 185.0 lb

## 2020-09-10 DIAGNOSIS — I10 Essential (primary) hypertension: Secondary | ICD-10-CM

## 2020-09-10 DIAGNOSIS — E1142 Type 2 diabetes mellitus with diabetic polyneuropathy: Secondary | ICD-10-CM | POA: Diagnosis not present

## 2020-09-10 DIAGNOSIS — E118 Type 2 diabetes mellitus with unspecified complications: Secondary | ICD-10-CM | POA: Diagnosis not present

## 2020-09-10 DIAGNOSIS — Z1159 Encounter for screening for other viral diseases: Secondary | ICD-10-CM | POA: Insufficient documentation

## 2020-09-10 DIAGNOSIS — Z23 Encounter for immunization: Secondary | ICD-10-CM | POA: Diagnosis not present

## 2020-09-10 DIAGNOSIS — Z114 Encounter for screening for human immunodeficiency virus [HIV]: Secondary | ICD-10-CM | POA: Insufficient documentation

## 2020-09-10 LAB — CBC WITH DIFFERENTIAL/PLATELET
Basophils Absolute: 0 10*3/uL (ref 0.0–0.1)
Basophils Relative: 0.4 % (ref 0.0–3.0)
Eosinophils Absolute: 0.2 10*3/uL (ref 0.0–0.7)
Eosinophils Relative: 3.2 % (ref 0.0–5.0)
HCT: 40.5 % (ref 36.0–46.0)
Hemoglobin: 13.8 g/dL (ref 12.0–15.0)
Lymphocytes Relative: 31.5 % (ref 12.0–46.0)
Lymphs Abs: 2 10*3/uL (ref 0.7–4.0)
MCHC: 34.2 g/dL (ref 30.0–36.0)
MCV: 95 fl (ref 78.0–100.0)
Monocytes Absolute: 0.7 10*3/uL (ref 0.1–1.0)
Monocytes Relative: 10.9 % (ref 3.0–12.0)
Neutro Abs: 3.4 10*3/uL (ref 1.4–7.7)
Neutrophils Relative %: 54 % (ref 43.0–77.0)
Platelets: 354 10*3/uL (ref 150.0–400.0)
RBC: 4.26 Mil/uL (ref 3.87–5.11)
RDW: 13.1 % (ref 11.5–15.5)
WBC: 6.2 10*3/uL (ref 4.0–10.5)

## 2020-09-10 LAB — BASIC METABOLIC PANEL
BUN: 19 mg/dL (ref 6–23)
CO2: 28 mEq/L (ref 19–32)
Calcium: 9.7 mg/dL (ref 8.4–10.5)
Chloride: 102 mEq/L (ref 96–112)
Creatinine, Ser: 0.8 mg/dL (ref 0.40–1.20)
GFR: 79.91 mL/min (ref >60.00)
Glucose, Bld: 96 mg/dL (ref 70–99)
Potassium: 4.1 mEq/L (ref 3.5–5.1)
Sodium: 137 mEq/L (ref 135–145)

## 2020-09-10 LAB — URINALYSIS, ROUTINE W REFLEX MICROSCOPIC
Bilirubin Urine: NEGATIVE
Hgb urine dipstick: NEGATIVE
Ketones, ur: NEGATIVE
Nitrite: NEGATIVE
Specific Gravity, Urine: 1.015 (ref 1.000–1.030)
Total Protein, Urine: NEGATIVE
Urine Glucose: >=1000 — AB
Urobilinogen, UA: 0.2 (ref 0.0–1.0)
pH: 6 (ref 5.0–8.0)

## 2020-09-10 LAB — HEMOGLOBIN A1C: Hgb A1c MFr Bld: 7 % — ABNORMAL HIGH (ref 4.6–6.5)

## 2020-09-10 LAB — MICROALBUMIN / CREATININE URINE RATIO
Creatinine,U: 105.2 mg/dL
Microalb Creat Ratio: 0.8 mg/g (ref 0.0–30.0)
Microalb, Ur: 0.9 mg/dL (ref 0.0–1.9)

## 2020-09-10 LAB — TSH: TSH: 2.39 u[IU]/mL (ref 0.35–5.50)

## 2020-09-10 NOTE — Progress Notes (Signed)
Subjective:  Patient ID: [name], female    DOB: February 18, 1960  Age: 60 y.o. MRN: [id number]  CC: Hypertension and Diabetes  This visit occurred during the SARS-CoV-2 public health emergency.  Safety protocols were in place, including screening questions prior to the visit, additional usage of staff PPE, and extensive cleaning of exam room while observing appropriate contact time as indicated for disinfecting solutions.    HPI PERLE FISHMAN presents for f/up -   She swims laps every other day.  She does not experience chest pain, shortness of breath, diaphoresis, fatigue, or edema.  Outpatient Medications Prior to Visit  Medication Sig Dispense Refill   Atorvastatin Calcium (LIPITOR PO) Take by mouth.     buPROPion (WELLBUTRIN XL) 300 MG 24 hr tablet Take 300 mg by mouth daily.  1   cetirizine (ZYRTEC) 10 MG tablet Take 10 mg by mouth daily.     citalopram (CELEXA) 40 MG tablet Take 40 mg by mouth daily.  0   Continuous Blood Gluc Receiver (FREESTYLE LIBRE 2 READER) DEVI 1 Act by Does not apply route daily. 2 each 5   Continuous Blood Gluc Sensor (FREESTYLE LIBRE 2 SENSOR) MISC 1 Act by Does not apply route daily. 2 each 5   Cyanocobalamin (VITAMIN B12) 1000 MCG TBCR Take by mouth.     dapagliflozin propanediol (FARXIGA) 10 MG TABS tablet Take 1 tablet (10 mg total) by mouth daily before breakfast. 90 tablet 1   glucosamine-chondroitin 500-400 MG tablet Take by mouth 3 (three) times daily.     pantoprazole (PROTONIX) 40 MG tablet Take 40 mg by mouth daily.  1   ramipril (ALTACE) 5 MG capsule Take 5 mg by mouth daily.  1   Semaglutide (RYBELSUS) 7 MG TABS Take 7 mg by mouth daily. 90 tablet 1   VITAMIN D PO Take 5,000 Units by mouth.     pregabalin (LYRICA) 25 MG capsule Take 1 capsule (25 mg total) by mouth 3 (three) times daily. (Patient not taking: No sig reported) 270 capsule 0   No facility-administered medications prior to visit.    ROS Review of Systems   Constitutional:  Negative for diaphoresis and fatigue.  HENT: Negative.    Eyes: Negative.   Respiratory:  Negative for cough, chest tightness, shortness of breath and wheezing.   Cardiovascular:  Negative for chest pain, palpitations and leg swelling.  Gastrointestinal:  Negative for abdominal pain, constipation, diarrhea and nausea.  Endocrine: Negative.   Genitourinary: Negative.  Negative for difficulty urinating.  Musculoskeletal: Negative.  Negative for arthralgias.  Skin: Negative.   Neurological: Negative.  Negative for dizziness, weakness, light-headedness and headaches.  Hematological:  Negative for adenopathy. Does not bruise/bleed easily.  Psychiatric/Behavioral: Negative.     Objective:  BP 122/86 (BP Location: Right Arm, Patient Position: Sitting, Cuff Size: Large)   Pulse 78   Temp 98.4 F (36.9 C) (Oral)   Resp 16   Ht '5\' 6"'$  (1.676 m)   Wt 185 lb (83.9 kg)   LMP 02/02/2013   SpO2 98%   BMI 29.86 kg/m   BP Readings from Last 3 Encounters:  09/10/20 122/86  06/11/20 126/86  05/09/20 140/82    Wt Readings from Last 3 Encounters:  09/10/20 185 lb (83.9 kg)  08/14/20 188 lb 8 oz (85.5 kg)  06/11/20 199 lb (90.3 kg)    Physical Exam  Lab Results  Component Value Date   WBC 6.2 09/10/2020   HGB 13.8 09/10/2020  HCT 40.5 09/10/2020   PLT 354.0 09/10/2020   GLUCOSE 96 09/10/2020   CHOL 154 01/30/2020   TRIG 88 01/30/2020   HDL 42 01/30/2020   LDLCALC 96 01/30/2020   NA 137 09/10/2020   K 4.1 09/10/2020   CL 102 09/10/2020   CREATININE 0.80 09/10/2020   BUN 19 09/10/2020   CO2 28 09/10/2020   TSH 2.39 09/10/2020   INR 1.0 09/18/2006   HGBA1C 7.0 (H) 09/10/2020   MICROALBUR 0.9 09/10/2020    DG Chest 2 View  Result Date: 05/08/2020 CLINICAL DATA:  Pre-op respiratory exam EXAM: CHEST - 2 VIEW COMPARISON:  02/05/2014 from [facility] FINDINGS: The heart size and mediastinal contours are within normal limits. Both lungs are clear. The  visualized skeletal structures are unremarkable. IMPRESSION: No active cardiopulmonary disease. Electronically Signed   By: Marlaine Hind M.D.   On: 05/08/2020 09:38    Assessment & Plan:   Khadijatou was seen today for hypertension and diabetes.  Diagnoses and all orders for this visit:  Primary hypertension- Her BP is well controlled. -     CBC with Differential/Platelet; Future -     Basic metabolic panel; Future -     TSH; Future -     Urinalysis, Routine w reflex microscopic; Future -     Microalbumin / creatinine urine ratio; Future -     Microalbumin / creatinine urine ratio -     TSH -     Basic metabolic panel -     CBC with Differential/Platelet -     Urinalysis, Routine w reflex microscopic  Diabetic polyneuropathy associated with type 2 diabetes mellitus ([place])- She has decided not to take lyrica. -     Hemoglobin A1c; Future -     HM Diabetes Foot Exam -     Hemoglobin A1c  Need for hepatitis C screening test -     Hepatitis C antibody; Future -     Hepatitis C antibody  Encounter for screening for HIV -     HIV Antibody (routine testing w rflx); Future -     HIV Antibody (routine testing w rflx)  Type II diabetes mellitus with manifestations ([place])- Her blood sugar is adequately well controlled. -     Basic metabolic panel; Future -     HM Diabetes Foot Exam -     Basic metabolic panel  Need for vaccination -     Varicella-zoster vaccine IM (Shingrix)  I have discontinued Alyssandra L. Hopke's pregabalin. I am also having her maintain her buPROPion, citalopram, pantoprazole, ramipril, cetirizine, Atorvastatin Calcium (LIPITOR PO), glucosamine-chondroitin, Vitamin B12, VITAMIN D PO, dapagliflozin propanediol, FreeStyle Libre 2 Sensor, [employer] 2 Reader, and Rybelsus.  No orders of the defined types were placed in this encounter.    Follow-up: Return in about 6 months (around 03/13/2021).  [name], MD

## 2020-09-10 NOTE — Patient Instructions (Signed)

## 2020-09-11 LAB — HEPATITIS C ANTIBODY
Hepatitis C Ab: NONREACTIVE
SIGNAL TO CUT-OFF: 0.01 (ref <1.00)

## 2020-09-11 LAB — HIV ANTIBODY (ROUTINE TESTING W REFLEX): HIV 1&2 Ab, 4th Generation: NONREACTIVE

## 2020-09-13 ENCOUNTER — Telehealth: Payer: Self-pay | Admitting: Internal Medicine

## 2020-09-13 NOTE — Telephone Encounter (Signed)
Side effects: out of work 3 days, arm has 4in x 3in area swollen and painful, fever, headache, body aches, weakness, felt like had the flu  If she had known she wouldn't have done it on a Monday morning, but instead a Friday afternoon.  Patient just wants to make someone aware of what she is experiencing  Feeling better this morning except for the arm, it is painful and swollen, looks like a rash, but  Patient had a mild case of shingles early part of this year, or last winter

## 2020-10-08 ENCOUNTER — Encounter: Payer: Self-pay | Admitting: Dietician

## 2020-10-08 ENCOUNTER — Other Ambulatory Visit: Payer: Self-pay

## 2020-10-08 ENCOUNTER — Encounter: Payer: BC Managed Care – PPO | Attending: Internal Medicine | Admitting: Dietician

## 2020-10-08 VITALS — Ht 66.0 in | Wt 184.5 lb

## 2020-10-08 DIAGNOSIS — E119 Type 2 diabetes mellitus without complications: Secondary | ICD-10-CM | POA: Diagnosis present

## 2020-10-08 NOTE — Progress Notes (Signed)
Diabetes Self-Management Education  Visit Type: Follow-up  Appt. Start Time: 1600 Appt. End Time: 9417  10/08/2020  [name], identified by name and date of birth, is a 60 y.o. female with a diagnosis of Diabetes:  .   ASSESSMENT Pt is still using Freestyle Libre and has downloaded the app for their phone. Pt reports experiencing multiple low blood sugars in the last few days. Pt reports fatigue, weakness, dizziness when sugar is low. Pt states it has gone as low as 60. Pt reports drinking orange juice, eating a yogurt, or snack fig bars to get it back up. Pt reports getting fuller faster when eating meals now, but gets hungrier faster as well. Pt states they think this makes them want to snack more. Pt is back working full time now, states it gives them less time for self care. Pt reports only being able to exercise 2-3 times a week now. Pt is now using their FitBit to track steps and activity minutes now.   Height 5\' 6"  (1.676 m), weight 184 lb 8 oz (83.7 kg), last menstrual period 02/02/2013. Body mass index is 29.78 kg/m.   Diabetes Self-Management Education - 10/08/20 1625       Visit Information   Visit Type Follow-up      Complications   Last HgB A1C per patient/outside source 7 %    How often do you check your blood sugar? > 4 times/day   Freestyle Libre   Number of hypoglycemic episodes per month 3    Can you tell when your blood sugar is low? Yes    What do you do if your blood sugar is low? drink OJ, eat a yogurt      Dietary Intake   Breakfast Greek yogurt with fruit, 1 piece whole wheat toast, coffee w/ cream    Lunch Veggie omelette, small bowl of grits, 2 pieces of rye toast, tea    Snack (afternoon) Peach, spoonful of PB (Hypoglycemic event)    Dinner 1 piece supreme pizza, salad    Beverage(s) coffee      Exercise   Exercise Type ADL's;Moderate (swimming / aerobic walking)    How many days per week to you exercise? 3    How many minutes per day do  you exercise? 60    Total minutes per week of exercise 180      Individualized Goals (developed by patient)   Nutrition Follow meal plan discussed   Eat more frequently to prevent lows   Physical Activity Exercise 3-5 times per week    Medications take my medication as prescribed;Other (comment)   Call PCP regarding hypoglycemia, see if medication adjustment is necessary   Monitoring  send in my blood glucose log as discussed      Patient Self-Evaluation of Goals - Patient rates self as meeting previously set goals (% of time)   Nutrition >75%    Physical Activity 50 - 75 %    Medications >75%    Monitoring >75%    Problem Solving 50 - 75 %    Reducing Risk 25 - 50%    Health Coping 25 - 50%      Post-Education Assessment   Patient understands the diabetes disease and treatment process. Needs Review    Patient understands incorporating nutritional management into lifestyle. Needs Review    Patient undertands incorporating physical activity into lifestyle. Needs Review    Patient understands using medications safely. Needs Review    Patient understands monitoring  blood glucose, interpreting and using results Needs Review    Patient understands prevention, detection, and treatment of acute complications. Needs Review    Patient understands prevention, detection, and treatment of chronic complications. Needs Review    Patient understands how to develop strategies to address psychosocial issues. Needs Review    Patient understands how to develop strategies to promote health/change behavior. Needs Review      Outcomes   Expected Outcomes Demonstrated interest in learning. Expect positive outcomes    Future DMSE 3-4 months    Program Status Not Completed      Subsequent Visit   Since your last visit have you continued or begun to take your medications as prescribed? Yes    Since your last visit have you had your blood pressure checked? Yes    Is your most recent blood pressure lower,  unchanged, or higher since your last visit? Lower    Since your last visit have you experienced any weight changes? Loss    Weight Loss (lbs) 4    Since your last visit, are you checking your blood glucose at least once a day? Yes   Pt uses CGM            Individualized Plan for Diabetes Self-Management Training:   Learning Objective:  Patient will have a greater understanding of diabetes self-management. Patient education plan is to attend individual and/or group sessions per assessed needs and concerns.   Plan:   Patient Instructions  If you begin to experience low blood sugar, check your blood sugar immediately. If it is <70, have 4 oz of fruit juice or soda, or 3-4 hard candies and check your blood sugar after 15 minutes. It should be at or above 100. If not, have another serving of juice or candy.  Call or send a MyChart message to your primary care tomorrow regarding your recent bouts of hypoglycemia.  Work on Writer. Go for walks when you are stressed at work, and think about ways to prevent excessive stress in the future if you change jobs!   Eat every 2-3 hours, having balanced snacks (carb+protein) in between meals.  Expected Outcomes:  Demonstrated interest in learning. Expect positive outcomes  Education material provided: Snack sheet  If problems or questions, patient to contact team via:  Phone and Email  Future DSME appointment: 3-4 months

## 2020-10-08 NOTE — Patient Instructions (Addendum)
If you begin to experience low blood sugar, check your blood sugar immediately. If it is <70, have 4 oz of fruit juice or soda, or 3-4 hard candies and check your blood sugar after 15 minutes. It should be at or above 100. If not, have another serving of juice or candy.  Call or send a MyChart message to your primary care tomorrow regarding your recent bouts of hypoglycemia.  Work on Writer. Go for walks when you are stressed at work, and think about ways to prevent excessive stress in the future if you change jobs!   Eat every 2-3 hours, having balanced snacks (carb+protein) in between meals.

## 2020-10-09 ENCOUNTER — Encounter: Payer: Self-pay | Admitting: Internal Medicine

## 2020-10-10 ENCOUNTER — Other Ambulatory Visit: Payer: Self-pay | Admitting: Internal Medicine

## 2020-12-14 ENCOUNTER — Other Ambulatory Visit: Payer: Self-pay | Admitting: Internal Medicine

## 2020-12-14 DIAGNOSIS — E1142 Type 2 diabetes mellitus with diabetic polyneuropathy: Secondary | ICD-10-CM

## 2020-12-14 DIAGNOSIS — E118 Type 2 diabetes mellitus with unspecified complications: Secondary | ICD-10-CM

## 2021-01-07 ENCOUNTER — Encounter: Payer: BC Managed Care – PPO | Attending: Internal Medicine | Admitting: Dietician

## 2021-01-07 ENCOUNTER — Encounter: Payer: Self-pay | Admitting: Internal Medicine

## 2021-01-07 ENCOUNTER — Encounter: Payer: Self-pay | Admitting: Dietician

## 2021-01-07 ENCOUNTER — Other Ambulatory Visit: Payer: Self-pay

## 2021-01-07 DIAGNOSIS — E118 Type 2 diabetes mellitus with unspecified complications: Secondary | ICD-10-CM | POA: Diagnosis present

## 2021-01-07 NOTE — Patient Instructions (Addendum)
Put a Freestyle Libre sensor on your arm as soon as you get home.  When eating away from home, work on building your meal to the proportions of the balanced plate. First, select your carb choice(s) for the meal, make it 25% of the meal Next, select your source of protein to pair with your carb choice, make it another 25% of the meal Finally, complete the remaining half of your meal with a variety of non-starchy vegetables, make them 50% of the meal  When you find yourself being impulsive (i.e. eating sweets), take TEN SECONDS between realizing the urge to eat and acting on it. This can help to put you back in control!  Choose sugar-free Jello or pudding, and Cool Whip lite and fruit for low calorie sweets

## 2021-01-07 NOTE — Progress Notes (Signed)
Diabetes Self-Management Education  Visit Type: Follow-up  Appt. Start Time: 1645 Appt. End Time: 1520  01/07/2021  [name], identified by name and date of birth, is a 60 y.o. female with a diagnosis of Diabetes:  .   ASSESSMENT Pt reports being taken off of Rybelsus due to hypoglycemia, pt has not experienced any hypoglycemia since discontinuing Rybelsus. Pt reports an increase in appetite immediately after discontinuing Rylbelsus. Pt reports blood sugars have gone back up a little, and they have not been putting a Libre sensor on in about a month. Pt motivation has declined since last visit. Pt reports losing a pet recently and is having difficulty coping.  Pt has switched jobs to a lower stress job but has not been able to exercise, shop, or cook as much now. Eating more convenience foods and meals away from home. Pt goes to [employer] once a week. Pt states they have become addicted to sweets since switching jobs, pt states that they feel comfort when having sweets.  Height 5\' 6"  (1.676 m), weight 185 lb (83.9 kg), last menstrual period 02/02/2013. Body mass index is 29.86 kg/m.   Diabetes Self-Management Education - 01/07/21 1650       Visit Information   Visit Type Follow-up      Health Coping   How would you rate your overall health? Fair      Complications   How often do you check your blood sugar? 0 times/day (not testing)    Fasting Blood glucose range (mg/dL) 180-200   Pt took finger stick FBG two weeks ago   Number of hypoglycemic episodes per month 0      Exercise   Exercise Type ADL's    How many days per week to you exercise? 0      Individualized Goals (developed by patient)   Nutrition Follow meal plan discussed    Medications take my medication as prescribed    Monitoring  test my blood glucose as discussed;Other (comment)   [place] again     Patient Self-Evaluation of Goals - Patient rates self as meeting previously set goals (% of time)    Nutrition 25 - 50%    Physical Activity < 25%    Medications >75%    Monitoring < 25%    Problem Solving 25 - 50%    Reducing Risk 25 - 50%    Health Coping < 25%      Post-Education Assessment   Patient understands the diabetes disease and treatment process. Needs Review    Patient understands incorporating nutritional management into lifestyle. Needs Review    Patient undertands incorporating physical activity into lifestyle. Needs Review    Patient understands using medications safely. Needs Review    Patient understands monitoring blood glucose, interpreting and using results Needs Review    Patient understands prevention, detection, and treatment of acute complications. Needs Review    Patient understands prevention, detection, and treatment of chronic complications. Needs Review    Patient understands how to develop strategies to address psychosocial issues. Needs Review    Patient understands how to develop strategies to promote health/change behavior. Needs Review      Outcomes   Expected Outcomes Demonstrated limited interest in learning.  Expect minimal changes    Future DMSE 2 months    Program Status Not Completed      Subsequent Visit   Since your last visit have you continued or begun to take your medications as prescribed? Yes  Since your last visit have you had your blood pressure checked? Yes    Is your most recent blood pressure lower, unchanged, or higher since your last visit? Unchanged    Since your last visit have you experienced any weight changes? No change    Since your last visit, are you checking your blood glucose at least once a day? No   Pt has not been using [place] in the past month            Individualized Plan for Diabetes Self-Management Training:   Learning Objective:  Patient will have a greater understanding of diabetes self-management. Patient education plan is to attend individual and/or group sessions per assessed needs and concerns.    Plan:   Patient Instructions  Put a Freestyle Libre sensor on your arm as soon as you get home.  When eating away from home, work on building your meal to the proportions of the balanced plate. First, select your carb choice(s) for the meal, make it 25% of the meal Next, select your source of protein to pair with your carb choice, make it another 25% of the meal Finally, complete the remaining half of your meal with a variety of non-starchy vegetables, make them 50% of the meal  When you find yourself being impulsive (i.e. eating sweets), take TEN SECONDS between realizing the urge to eat and acting on it. This can help to put you back in control!  Choose sugar-free Jello or pudding, and Cool Whip lite and fruit for low calorie sweets   Expected Outcomes:  Demonstrated limited interest in learning.  Expect minimal changes  If problems or questions, patient to contact team via:  Phone and Email  Future DSME appointment: 2 months

## 2021-01-08 NOTE — Telephone Encounter (Signed)
Patient calling to check status of mychart response  *see below*

## 2021-02-07 ENCOUNTER — Ambulatory Visit (INDEPENDENT_AMBULATORY_CARE_PROVIDER_SITE_OTHER): Payer: Self-pay | Admitting: Internal Medicine

## 2021-02-07 ENCOUNTER — Encounter: Payer: Self-pay | Admitting: Internal Medicine

## 2021-02-07 ENCOUNTER — Other Ambulatory Visit: Payer: Self-pay

## 2021-02-07 VITALS — BP 130/80 | HR 73 | Temp 98.2°F | Ht 66.0 in | Wt 184.0 lb

## 2021-02-07 DIAGNOSIS — E785 Hyperlipidemia, unspecified: Secondary | ICD-10-CM

## 2021-02-07 DIAGNOSIS — E118 Type 2 diabetes mellitus with unspecified complications: Secondary | ICD-10-CM

## 2021-02-07 DIAGNOSIS — I10 Essential (primary) hypertension: Secondary | ICD-10-CM

## 2021-02-07 NOTE — Progress Notes (Signed)
Subjective:  Patient ID: [name], female    DOB: 1960/05/23  Age: 61 y.o. MRN: [id number]  CC: Hyperlipidemia and Diabetes  This visit occurred during the SARS-CoV-2 public health emergency.  Safety protocols were in place, including screening questions prior to the visit, additional usage of staff PPE, and extensive cleaning of exam room while observing appropriate contact time as indicated for disinfecting solutions.    HPI [name] presents for f/up -  She complains that her blood sugar has been greater than 180 25% over the time over the last few weeks.  She denies polys.  She is active and denies chest pain, shortness of breath, diaphoresis, or edema.  Outpatient Medications Prior to Visit  Medication Sig Dispense Refill   Atorvastatin Calcium (LIPITOR PO) Take by mouth.     buPROPion (WELLBUTRIN XL) 300 MG 24 hr tablet Take 300 mg by mouth daily.  1   cetirizine (ZYRTEC) 10 MG tablet Take 10 mg by mouth daily.     citalopram (CELEXA) 40 MG tablet Take 40 mg by mouth daily.  0   Continuous Blood Gluc Receiver (FREESTYLE LIBRE 2 READER) DEVI 1 Act by Does not apply route daily. 2 each 5   Continuous Blood Gluc Sensor (FREESTYLE LIBRE 2 SENSOR) MISC 1 Act by Does not apply route daily. 2 each 5   Cyanocobalamin (VITAMIN B12) 1000 MCG TBCR Take by mouth.     FARXIGA 10 MG TABS tablet TAKE 1 TABLET BY MOUTH DAILY BEFORE BREAKFAST. 90 tablet 1   glucosamine-chondroitin 500-400 MG tablet Take by mouth 3 (three) times daily.     pantoprazole (PROTONIX) 40 MG tablet Take 40 mg by mouth daily.  1   ramipril (ALTACE) 5 MG capsule Take 5 mg by mouth daily.  1   VITAMIN D PO Take 5,000 Units by mouth.     No facility-administered medications prior to visit.    ROS Review of Systems  Constitutional:  Negative for diaphoresis, fatigue and unexpected weight change.  HENT: Negative.    Eyes: Negative.   Respiratory:  Negative for cough, chest tightness, shortness of breath  and wheezing.   Cardiovascular:  Negative for chest pain, palpitations and leg swelling.  Gastrointestinal:  Negative for abdominal pain, constipation, diarrhea, nausea and vomiting.  Endocrine: Negative.   Genitourinary: Negative.  Negative for difficulty urinating.  Musculoskeletal:  Negative for arthralgias.  Skin: Negative.   Neurological:  Negative for dizziness and weakness.  Hematological:  Negative for adenopathy. Does not bruise/bleed easily.  Psychiatric/Behavioral: Negative.     Objective:  BP 130/80 (BP Location: Right Arm, Patient Position: Sitting, Cuff Size: Large)    Pulse 73    Temp 98.2 F (36.8 C) (Oral)    Ht 5\' 6"  (1.676 m)    Wt 184 lb (83.5 kg)    LMP 02/02/2013    SpO2 97%    BMI 29.70 kg/m   BP Readings from Last 3 Encounters:  02/07/21 130/80  09/10/20 122/86  06/11/20 126/86    Wt Readings from Last 3 Encounters:  02/07/21 184 lb (83.5 kg)  01/07/21 185 lb (83.9 kg)  10/08/20 184 lb 8 oz (83.7 kg)    Physical Exam Vitals reviewed.  HENT:     Nose: Nose normal.     Mouth/Throat:     Mouth: Mucous membranes are moist.  Eyes:     General: No scleral icterus. Cardiovascular:     Rate and Rhythm: Normal rate and regular rhythm.  Heart sounds: No murmur heard. Pulmonary:     Effort: Pulmonary effort is normal.     Breath sounds: No stridor. No wheezing, rhonchi or rales.  Abdominal:     General: Abdomen is flat.     Palpations: There is no mass.     Tenderness: There is no abdominal tenderness. There is no guarding.     Hernia: No hernia is present.  Musculoskeletal:     Cervical back: Neck supple.  Lymphadenopathy:     Cervical: No cervical adenopathy.  Skin:    General: Skin is warm and dry.     Coloration: Skin is not pale.  Neurological:     General: No focal deficit present.     Mental Status: She is alert.  Psychiatric:        Mood and Affect: Mood normal.        Behavior: Behavior normal.    Lab Results  Component Value  Date   WBC 6.2 09/10/2020   HGB 13.8 09/10/2020   HCT 40.5 09/10/2020   PLT 354.0 09/10/2020   GLUCOSE 126 (H) 02/07/2021   CHOL 152 02/07/2021   TRIG 83.0 02/07/2021   HDL 47.80 02/07/2021   LDLCALC 88 02/07/2021   ALT 18 02/07/2021   AST 15 02/07/2021   NA 138 02/07/2021   K 4.5 02/07/2021   CL 101 02/07/2021   CREATININE 0.80 02/07/2021   BUN 19 02/07/2021   CO2 28 02/07/2021   TSH 2.39 09/10/2020   INR 1.0 09/18/2006   HGBA1C 8.1 (H) 02/07/2021   MICROALBUR 0.9 09/10/2020    No results found.  Assessment & Plan:   [name] was seen today for hyperlipidemia and diabetes.  Diagnoses and all orders for this visit:  Primary hypertension- Her BP is well controlled. -     Basic metabolic panel; Future -     Hepatic function panel; Future -     Hepatic function panel -     Basic metabolic panel  Type II diabetes mellitus with manifestations ([place])- Her A1C is up to 8.1%. will restart the GLP-1 ag. -     Hemoglobin A1c; Future -     Hemoglobin A1c -     Semaglutide (RYBELSUS) 3 MG TABS; Take 3 mg by mouth daily.  Hyperlipidemia with target LDL less than 100- LDL goal achieved. Doing well on the statin  -     Lipid panel; Future -     Lipid panel   I am having [name] start on Rybelsus. I am also having her maintain her buPROPion, citalopram, pantoprazole, ramipril, cetirizine, Atorvastatin Calcium (LIPITOR PO), glucosamine-chondroitin, Vitamin B12, VITAMIN D PO, FreeStyle Libre 2 Sensor, [employer] 2 Reader, and [place].  Meds ordered this encounter  Medications   Semaglutide (RYBELSUS) 3 MG TABS    Sig: Take 3 mg by mouth daily.    Dispense:  30 tablet    Refill:  0     Follow-up: Return in about 6 months (around 08/07/2021).  [name], MD

## 2021-02-07 NOTE — Patient Instructions (Signed)
Type 2 Diabetes Mellitus, Diagnosis, Adult ?Type 2 diabetes (type 2 diabetes mellitus) is a long-term, or chronic, disease. In type 2 diabetes, one or both of these problems may be present: ?The pancreas does not make enough of a hormone called insulin. ?Cells in the body do not respond properly to the insulin that the body makes (insulin resistance). ?Normally, insulin allows blood sugar (glucose) to enter cells in the body. The cells use glucose for energy. Insulin resistance or lack of insulin causes excess glucose to build up in the blood instead of going into cells. This causes high blood glucose (hyperglycemia).  ?What are the causes? ?The exact cause of type 2 diabetes is not known. ?What increases the risk? ?The following factors may make you more likely to develop this condition: ?Having a family member with type 2 diabetes. ?Being overweight or obese. ?Being inactive (sedentary). ?Having been diagnosed with insulin resistance. ?Having a history of prediabetes, diabetes when you were pregnant (gestational diabetes), or polycystic ovary syndrome (PCOS). ?What are the signs or symptoms? ?In the early stage of this condition, you may not have symptoms. Symptoms develop slowly and may include: ?Increased thirst or hunger. ?Increased urination. ?Unexplained weight loss. ?Tiredness (fatigue) or weakness. ?Vision changes, such as blurry vision. ?Dark patches on the skin. ?How is this diagnosed? ?This condition is diagnosed based on your symptoms, your medical history, a physical exam, and your blood glucose level. Your blood glucose may be checked with one or more of the following blood tests: ?A fasting blood glucose (FBG) test. You will not be allowed to eat (you will fast) for 8 hours or longer before a blood sample is taken. ?A random blood glucose test. This test checks blood glucose at any time of day regardless of when you ate. ?An A1C (hemoglobin A1C) blood test. This test provides information about blood  glucose levels over the previous 2-3 months. ?An oral glucose tolerance test (OGTT). This test measures your blood glucose at two times: ?After fasting. This is your baseline blood glucose level. ?Two hours after drinking a beverage that contains glucose. ?You may be diagnosed with type 2 diabetes if: ?Your fasting blood glucose level is 126 mg/dL (7.0 mmol/L) or higher. ?Your random blood glucose level is 200 mg/dL (11.1 mmol/L) or higher. ?Your A1C level is 6.5% or higher. ?Your oral glucose tolerance test result is higher than 200 mg/dL (11.1 mmol/L). ?These blood tests may be repeated to confirm your diagnosis. ?How is this treated? ?Your treatment may be managed by a specialist called an endocrinologist. Type 2 diabetes may be treated by following instructions from your health care provider about: ?Making dietary and lifestyle changes. These may include: ?Following a personalized nutrition plan that is developed by a registered dietitian. ?Exercising regularly. ?Finding ways to manage stress. ?Checking your blood glucose level as often as told. ?Taking diabetes medicines or insulin daily. This helps to keep your blood glucose levels in the healthy range. ?Taking medicines to help prevent complications from diabetes. Medicines may include: ?Aspirin. ?Medicine to lower cholesterol. ?Medicine to control blood pressure. ?Your health care provider will set treatment goals for you. Your goals will be based on your age, other medical conditions you have, and how you respond to diabetes treatment. Generally, the goal of treatment is to maintain the following blood glucose levels: ?Before meals: 80-130 mg/dL (4.4-7.2 mmol/L). ?After meals: below 180 mg/dL (10 mmol/L). ?A1C level: less than 7%. ?Follow these instructions at home: ?Questions to ask your health care provider ?  Consider asking the following questions: ?Should I meet with a certified diabetes care and education specialist? ?What diabetes medicines do I need,  and when should I take them? ?What equipment will I need to manage my diabetes at home? ?How often do I need to check my blood glucose? ?Where can I find a support group for people with diabetes? ?What number can I call if I have questions? ?When is my next appointment? ?General instructions ?Take over-the-counter and prescription medicines only as told by your health care provider. ?Keep all follow-up visits. This is important. ?Where to find more information ?For help and guidance and for more information about diabetes, please visit: ?American Diabetes Association (ADA): www.diabetes.org ?American Association of Diabetes Care and Education Specialists (ADCES): www.diabeteseducator.org ?International Diabetes Federation (IDF): www.idf.org ?Contact a health care provider if: ?Your blood glucose is at or above 240 mg/dL (13.3 mmol/L) for 2 days in a row. ?You have been sick or have had a fever for 2 days or longer, and you are not getting better. ?You have any of the following problems for more than 6 hours: ?You cannot eat or drink. ?You have nausea and vomiting. ?You have diarrhea. ?Get help right away if: ?You have severe hypoglycemia. This means your blood glucose is lower than 54 mg/dL (3.0 mmol/L). ?You become confused or you have trouble thinking clearly. ?You have difficulty breathing. ?You have moderate or large ketone levels in your urine. ?These symptoms may represent a serious problem that is an emergency. Do not wait to see if the symptoms will go away. Get medical help right away. Call your local emergency services (911 in the [place] Do not drive yourself to the hospital. ?Summary ?Type 2 diabetes mellitus is a long-term, or chronic, disease. In type 2 diabetes, the pancreas does not make enough of a hormone called insulin, or cells in the body do not respond properly to insulin that the body makes. ?This condition is treated by making dietary and lifestyle changes and taking diabetes medicines or  insulin. ?Your health care provider will set treatment goals for you. Your goals will be based on your age, other medical conditions you have, and how you respond to diabetes treatment. ?Keep all follow-up visits. This is important. ?This information is not intended to replace advice given to you by your health care provider. Make sure you discuss any questions you have with your health care provider. ?Document Revised: 04/02/2020 Document Reviewed: 04/02/2020 ?Elsevier Patient Education ? 2022 Elsevier Inc. ? ?

## 2021-02-08 LAB — BASIC METABOLIC PANEL
BUN: 19 mg/dL (ref 6–23)
CO2: 28 mEq/L (ref 19–32)
Calcium: 9.9 mg/dL (ref 8.4–10.5)
Chloride: 101 mEq/L (ref 96–112)
Creatinine, Ser: 0.8 mg/dL (ref 0.40–1.20)
GFR: 79.68 mL/min (ref >60.00)
Glucose, Bld: 126 mg/dL — ABNORMAL HIGH (ref 70–99)
Potassium: 4.5 mEq/L (ref 3.5–5.1)
Sodium: 138 mEq/L (ref 135–145)

## 2021-02-08 LAB — HEPATIC FUNCTION PANEL
ALT: 18 U/L (ref 0–35)
AST: 15 U/L (ref 0–37)
Albumin: 4.7 g/dL (ref 3.5–5.2)
Alkaline Phosphatase: 72 U/L (ref 39–117)
Bilirubin, Direct: 0.1 mg/dL (ref 0.0–0.3)
Total Bilirubin: 0.3 mg/dL (ref 0.2–1.2)
Total Protein: 7.9 g/dL (ref 6.0–8.3)

## 2021-02-08 LAB — LIPID PANEL
Cholesterol: 152 mg/dL (ref 0–200)
HDL: 47.8 mg/dL (ref >39.00)
LDL Cholesterol: 88 mg/dL (ref 0–99)
NonHDL: 104.65
Total CHOL/HDL Ratio: 3
Triglycerides: 83 mg/dL (ref 0.0–149.0)
VLDL: 16.6 mg/dL (ref 0.0–40.0)

## 2021-02-08 LAB — HEMOGLOBIN A1C: Hgb A1c MFr Bld: 8.1 % — ABNORMAL HIGH (ref 4.6–6.5)

## 2021-02-08 MED ORDER — RYBELSUS 3 MG PO TABS: 3.0000 mg | ORAL_TABLET | Freq: Every day | ORAL | 0 refills | Status: DC

## 2021-02-14 ENCOUNTER — Encounter: Payer: Self-pay | Admitting: Internal Medicine

## 2021-02-14 ENCOUNTER — Other Ambulatory Visit: Payer: Self-pay | Admitting: Internal Medicine

## 2021-02-14 DIAGNOSIS — E118 Type 2 diabetes mellitus with unspecified complications: Secondary | ICD-10-CM

## 2021-02-14 DIAGNOSIS — E1142 Type 2 diabetes mellitus with diabetic polyneuropathy: Secondary | ICD-10-CM

## 2021-02-14 MED ORDER — TIRZEPATIDE 2.5 MG/0.5ML ~~LOC~~ SOAJ: 2.5000 mg | SUBCUTANEOUS | 0 refills | Status: DC

## 2021-02-19 ENCOUNTER — Other Ambulatory Visit: Payer: Self-pay | Admitting: Internal Medicine

## 2021-02-19 DIAGNOSIS — E118 Type 2 diabetes mellitus with unspecified complications: Secondary | ICD-10-CM

## 2021-02-19 MED ORDER — TRULICITY 0.75 MG/0.5ML ~~LOC~~ SOAJ
0.7500 mg | SUBCUTANEOUS | 0 refills | Status: DC
Start: 2021-02-19 — End: 2021-02-25

## 2021-02-25 ENCOUNTER — Other Ambulatory Visit: Payer: Self-pay | Admitting: Internal Medicine

## 2021-02-25 DIAGNOSIS — E118 Type 2 diabetes mellitus with unspecified complications: Secondary | ICD-10-CM

## 2021-02-25 MED ORDER — RYBELSUS 3 MG PO TABS: 3.0000 mg | ORAL_TABLET | Freq: Every day | ORAL | 0 refills | Status: DC

## 2021-02-25 NOTE — Telephone Encounter (Signed)
Pt is calling stating that the insurance company need supporting documents saying that she has tried other options in order for Rybelsus to be covered.  Provider line [phone]  Pt CB [phone]

## 2021-02-28 DIAGNOSIS — F4323 Adjustment disorder with mixed anxiety and depressed mood: Secondary | ICD-10-CM | POA: Diagnosis not present

## 2021-03-04 NOTE — Telephone Encounter (Signed)
Key: [id number]  Approved Effective from 03/04/2021 through 03/03/2022.

## 2021-03-04 NOTE — Telephone Encounter (Signed)
Pt has been informed that PA was approved.

## 2021-03-04 NOTE — Telephone Encounter (Signed)
I sure can!

## 2021-03-05 ENCOUNTER — Encounter: Payer: Self-pay | Admitting: Dietician

## 2021-03-05 ENCOUNTER — Encounter: Payer: BC Managed Care – PPO | Attending: Internal Medicine | Admitting: Dietician

## 2021-03-05 ENCOUNTER — Other Ambulatory Visit: Payer: Self-pay

## 2021-03-05 DIAGNOSIS — E118 Type 2 diabetes mellitus with unspecified complications: Secondary | ICD-10-CM | POA: Insufficient documentation

## 2021-03-05 NOTE — Progress Notes (Signed)
Diabetes Self-Management Education  Visit Type: Follow-up  Appt. Start Time: 1645 Appt. End Time: 2035  03/05/2021  [name], identified by name and date of birth, is a 61 y.o. female with a diagnosis of Diabetes:  .   ASSESSMENT Pt A1c is now up to 8.1. Pt has been prescribed Rybelsus again, but has had a lot of difficulty getting their prescription filled due to switching insurance. Pt used a [employer] for two weeks after last appointment, but hasn't applied another one since. Pt states their BG was high when wearing it. Pt reports not being able to exercise much, if at all, with their new job. Pt reports feeling more pressure to be productive at work in the afternoon, forgets to snack. Pt reports their diet is good for breakfast and lunch, but dinner is where they make undesirable food choices. Pt states they are eating a lot of convenient foods in the evening (frozen pizza, lasagnas, etc.). Pt reports feeling the need to eat this way to get energy, pt feels low energy in the evenings.  Height 5\' 6"  (1.676 m), weight 188 lb 8 oz (85.5 kg), last menstrual period 02/02/2013. Body mass index is 30.42 kg/m.   Diabetes Self-Management Education - 03/05/21 1700       Visit Information   Visit Type Follow-up      Dietary Intake   Breakfast Yogurt, toast, PB, 6 oz milk, coffee    Snack (morning) Protein Bar    Lunch Deli chicken breast on pumpernickel, american cheese, water      Exercise   Exercise Type ADL's    How many days per week to you exercise? 0    How many minutes per day do you exercise? 0    Total minutes per week of exercise 0      Individualized Goals (developed by patient)   Nutrition Follow meal plan discussed    Monitoring  test my blood glucose as discussed      Patient Self-Evaluation of Goals - Patient rates self as meeting previously set goals (% of time)   Nutrition 25 - 50%    Physical Activity < 25%    Medications 50 - 75 %    Monitoring <  25%    Problem Solving 25 - 50%    Reducing Risk < 25%    Health Coping < 25%      Post-Education Assessment   Patient understands the diabetes disease and treatment process. Needs Review    Patient understands incorporating nutritional management into lifestyle. Needs Review    Patient undertands incorporating physical activity into lifestyle. Needs Review    Patient understands using medications safely. Needs Review    Patient understands monitoring blood glucose, interpreting and using results Needs Review    Patient understands prevention, detection, and treatment of acute complications. Needs Review    Patient understands prevention, detection, and treatment of chronic complications. Needs Review    Patient understands how to develop strategies to address psychosocial issues. Needs Review    Patient understands how to develop strategies to promote health/change behavior. Needs Review      Outcomes   Expected Outcomes Demonstrated interest in learning. Expect positive outcomes    Future DMSE 2 months    Program Status Not Completed      Subsequent Visit   Since your last visit have you continued or begun to take your medications as prescribed? No   Difficulty getting prescription filled   Since your last  visit have you had your blood pressure checked? Yes    Is your most recent blood pressure lower, unchanged, or higher since your last visit? Unchanged    Since your last visit have you experienced any weight changes? Gain    Weight Gain (lbs) 3    Since your last visit, are you checking your blood glucose at least once a day? No             Individualized Plan for Diabetes Self-Management Training:   Learning Objective:  Patient will have a greater understanding of diabetes self-management. Patient education plan is to attend individual and/or group sessions per assessed needs and concerns.   Plan:   Patient Instructions  Begin to take a walk at work after you get off  before you get in your car to go home. Pay attention to how your energy level changes as you do this more often.  Keep track of your steps on your FitBit. Work towards getting back to 5,000 - 7,500 steps in a day.  Set an alarm on your phone for the afternoon to remind you to have a snack. Make this snack a combination of healthy carbs and low-fat protein!    Expected Outcomes:  Demonstrated interest in learning. Expect positive outcomes  Education material provided: Snack sheet  If problems or questions, patient to contact team via:  Phone and Email  Future DSME appointment: 2 months

## 2021-03-05 NOTE — Patient Instructions (Addendum)
Begin to take a walk at work after you get off before you get in your car to go home. Pay attention to how your energy level changes as you do this more often.  Keep track of your steps on your FitBit. Work towards getting back to 5,000 - 7,500 steps in a day.  Set an alarm on your phone for the afternoon to remind you to have a snack. Make this snack a combination of healthy carbs and low-fat protein!

## 2021-03-11 ENCOUNTER — Ambulatory Visit: Payer: Self-pay | Admitting: Internal Medicine

## 2021-03-13 DIAGNOSIS — Z1231 Encounter for screening mammogram for malignant neoplasm of breast: Secondary | ICD-10-CM | POA: Diagnosis not present

## 2021-03-13 LAB — HM MAMMOGRAPHY

## 2021-04-04 ENCOUNTER — Other Ambulatory Visit: Payer: Self-pay | Admitting: Internal Medicine

## 2021-04-04 DIAGNOSIS — F4323 Adjustment disorder with mixed anxiety and depressed mood: Secondary | ICD-10-CM | POA: Diagnosis not present

## 2021-04-06 ENCOUNTER — Other Ambulatory Visit: Payer: Self-pay | Admitting: Internal Medicine

## 2021-04-06 ENCOUNTER — Encounter: Payer: Self-pay | Admitting: Internal Medicine

## 2021-04-06 DIAGNOSIS — E118 Type 2 diabetes mellitus with unspecified complications: Secondary | ICD-10-CM

## 2021-04-06 MED ORDER — TRULICITY 1.5 MG/0.5ML ~~LOC~~ SOAJ
1.5000 mg | SUBCUTANEOUS | 0 refills | Status: DC
Start: 2021-04-06 — End: 2021-04-08

## 2021-04-08 ENCOUNTER — Other Ambulatory Visit: Payer: Self-pay | Admitting: Internal Medicine

## 2021-04-08 DIAGNOSIS — E118 Type 2 diabetes mellitus with unspecified complications: Secondary | ICD-10-CM

## 2021-04-08 MED ORDER — TRULICITY 0.75 MG/0.5ML ~~LOC~~ SOAJ: 0.7500 mg | SUBCUTANEOUS | 0 refills | Status: DC

## 2021-04-14 ENCOUNTER — Other Ambulatory Visit: Payer: Self-pay | Admitting: Internal Medicine

## 2021-04-15 ENCOUNTER — Other Ambulatory Visit (HOSPITAL_COMMUNITY): Payer: Self-pay

## 2021-04-15 MED ORDER — PANTOPRAZOLE SODIUM 40 MG PO TBEC
40.0000 mg | DELAYED_RELEASE_TABLET | Freq: Every day | ORAL | 1 refills | Status: DC
  Filled 2021-04-15: qty 90, 90d supply, fill #0

## 2021-04-24 LAB — HM DIABETES EYE EXAM

## 2021-04-29 DIAGNOSIS — R509 Fever, unspecified: Secondary | ICD-10-CM | POA: Diagnosis not present

## 2021-04-29 DIAGNOSIS — Z03818 Encounter for observation for suspected exposure to other biological agents ruled out: Secondary | ICD-10-CM | POA: Diagnosis not present

## 2021-04-29 DIAGNOSIS — Z20822 Contact with and (suspected) exposure to covid-19: Secondary | ICD-10-CM | POA: Diagnosis not present

## 2021-04-29 DIAGNOSIS — R110 Nausea: Secondary | ICD-10-CM | POA: Diagnosis not present

## 2021-05-01 ENCOUNTER — Ambulatory Visit: Payer: BC Managed Care – PPO | Admitting: Internal Medicine

## 2021-05-02 DIAGNOSIS — F4323 Adjustment disorder with mixed anxiety and depressed mood: Secondary | ICD-10-CM | POA: Diagnosis not present

## 2021-05-03 ENCOUNTER — Other Ambulatory Visit: Payer: Self-pay | Admitting: Internal Medicine

## 2021-05-03 DIAGNOSIS — E1142 Type 2 diabetes mellitus with diabetic polyneuropathy: Secondary | ICD-10-CM

## 2021-05-03 DIAGNOSIS — E118 Type 2 diabetes mellitus with unspecified complications: Secondary | ICD-10-CM

## 2021-05-05 ENCOUNTER — Other Ambulatory Visit: Payer: Self-pay | Admitting: Internal Medicine

## 2021-05-05 DIAGNOSIS — E1142 Type 2 diabetes mellitus with diabetic polyneuropathy: Secondary | ICD-10-CM

## 2021-05-05 DIAGNOSIS — E118 Type 2 diabetes mellitus with unspecified complications: Secondary | ICD-10-CM

## 2021-05-05 MED ORDER — FREESTYLE LIBRE 2 SENSOR MISC: 1.0000 | Freq: Every day | 5 refills | Status: DC

## 2021-05-06 ENCOUNTER — Encounter: Payer: Self-pay | Admitting: Dietician

## 2021-05-06 ENCOUNTER — Encounter: Payer: BC Managed Care – PPO | Attending: Internal Medicine | Admitting: Dietician

## 2021-05-06 DIAGNOSIS — Z713 Dietary counseling and surveillance: Secondary | ICD-10-CM | POA: Insufficient documentation

## 2021-05-06 DIAGNOSIS — E118 Type 2 diabetes mellitus with unspecified complications: Secondary | ICD-10-CM | POA: Insufficient documentation

## 2021-05-06 NOTE — Progress Notes (Signed)
Diabetes Self-Management Education ? ?Visit Type: Follow-up ? ?Appt. Start Time: 1645 Appt. End Time: 1025 ? ?05/06/2021 ? ?[name], identified by name and date of birth, is a 61 y.o. female with a diagnosis of Diabetes:  .  ? ?ASSESSMENT ?Pt has begun taking Trulicity for the last 7 weeks, states they don't like it as much [employer] because Trulicity doesn't take away their cravings. Pt reports mild nausea when they begin to feel hungry.  ?Pt continues to use Freestyle Libre average FBG is ~120, and 90 day CBG average is between 130-140. ?Pt reports that their blood sugar occasionally rises while they are asleep, around 3:00 - 4:00 AM, unsure why. ?Pt reports their activity level has increased a little, but still could do more. Pt reports physical activity is a big help to their mental health and wants to get back to being more consistent. Pt average step count is down to ~3,500 a day. ?Pt reports their evening food choices have gotten much better. Pt has been paying attention to their total carbohydrates and trying to balance them at each meal. ? ?Last menstrual period 02/02/2013. ?There is no height or weight on file to calculate BMI. ? ? Diabetes Self-Management Education - 05/06/21 1600   ? ?  ? Visit Information  ? Visit Type Follow-up   ?  ? Complications  ? How often do you check your blood sugar? > 4 times/day   CGM  ? Fasting Blood glucose range (mg/dL) 70-129   ? Postprandial Blood glucose range (mg/dL) 130-179;70-129   ?  ? Dietary Intake  ? Breakfast 2 skyr yogurts, coffee w/ half and half   ? Snack (morning) small banana, piece of cheese   ? Lunch [employer] and cheese biscuit   ? Dinner Spinach and cheese Ravioli, small foccaccia, milk   ? Beverage(s) Water, coffee, and milk   ?  ? Exercise  ? Exercise Type ADL's   ? How many days per week to you exercise? 3   ? How many minutes per day do you exercise? 25   ? Total minutes per week of exercise 75   ?  ? Patient Self-Evaluation of Goals -  Patient rates self as meeting previously set goals (% of time)  ? Nutrition 50 - 75 %   ? Physical Activity 25 - 50%   ? Medications >75%   ? Monitoring >75%   ? Problem Solving 25 - 50%   ? Reducing Risk 50 - 75 %   ? Health Coping 25 - 50%   ?  ? Post-Education Assessment  ? Patient understands the diabetes disease and treatment process. Needs Review   ? Patient understands incorporating nutritional management into lifestyle. Needs Review   ? Patient undertands incorporating physical activity into lifestyle. Needs Review   ? Patient understands using medications safely. Demonstrates understanding / competency   ? Patient understands monitoring blood glucose, interpreting and using results Demonstrates understanding / competency   ? Patient understands prevention, detection, and treatment of acute complications. Demonstrates understanding / competency   ? Patient understands prevention, detection, and treatment of chronic complications. Needs Review   ? Patient understands how to develop strategies to address psychosocial issues. Needs Review   ? Patient understands how to develop strategies to promote health/change behavior. Needs Review   ?  ? Outcomes  ? Expected Outcomes Demonstrated interest in learning. Expect positive outcomes   ? Future DMSE PRN   ? Program Status Not Completed   ?  ?  Subsequent Visit  ? Since your last visit have you continued or begun to take your medications as prescribed? Yes   Started Trulicity  ? Since your last visit have you had your blood pressure checked? No   ? Since your last visit, are you checking your blood glucose at least once a day? Yes   Freestyle Libre 2  ? ?  ?  ? ?  ? ? ?Individualized Plan for Diabetes Self-Management Training:  ? ?Learning Objective:  Patient will have a greater understanding of diabetes self-management. ?Patient education plan is to attend individual and/or group sessions per assessed needs and concerns. ?  ?Plan:  ? ?Patient Instructions  ?Begin to  add in a walk during the work days around lunch time. On days when you take your walks, aim for a step goal of over 5,000! ? ?Continue to pay attention to your carbohydrates and work on having consistent amounts as often as possible with each meal. ? ?Pay attention to when your blood sugar elevates in the middle of the night and compare that to when you took your Trulicity, what you had for dinner, or your activity level for the day. ? ?KEEP UP THE AMAZING WORK! ? ?Expected Outcomes:  Demonstrated interest in learning. Expect positive outcomes ? ?If problems or questions, patient to contact team via:  Phone and Email ? ?Future DSME appointment: PRN ?

## 2021-05-06 NOTE — Patient Instructions (Addendum)
Begin to add in a walk during the work days around lunch time. On days when you take your walks, aim for a step goal of over 5,000! ? ?Continue to pay attention to your carbohydrates and work on having consistent amounts as often as possible with each meal. ? ?Pay attention to when your blood sugar elevates in the middle of the night and compare that to when you took your Trulicity, what you had for dinner, or your activity level for the day. ? ?KEEP UP THE AMAZING WORK! ?

## 2021-05-12 ENCOUNTER — Other Ambulatory Visit: Payer: Self-pay | Admitting: Internal Medicine

## 2021-05-12 DIAGNOSIS — E118 Type 2 diabetes mellitus with unspecified complications: Secondary | ICD-10-CM

## 2021-05-14 DIAGNOSIS — H40013 Open angle with borderline findings, low risk, bilateral: Secondary | ICD-10-CM | POA: Diagnosis not present

## 2021-05-16 ENCOUNTER — Other Ambulatory Visit: Payer: Self-pay | Admitting: Internal Medicine

## 2021-05-16 ENCOUNTER — Telehealth: Payer: Self-pay

## 2021-05-16 DIAGNOSIS — E118 Type 2 diabetes mellitus with unspecified complications: Secondary | ICD-10-CM

## 2021-05-16 MED ORDER — TRULICITY 0.75 MG/0.5ML ~~LOC~~ SOAJ: 0.7500 mg | SUBCUTANEOUS | 0 refills | Status: DC

## 2021-05-16 NOTE — Telephone Encounter (Signed)
Pt is calling for a refill on: ?Dulaglutide (TRULICITY) 7.12 JW/9.0RM SOPN ? ?Pharmacy: ?[id number]- [place], [place] - [address] AT [place]? ?LOV 02/07/21 ? ?

## 2021-05-20 DIAGNOSIS — M810 Age-related osteoporosis without current pathological fracture: Secondary | ICD-10-CM | POA: Insufficient documentation

## 2021-05-27 DIAGNOSIS — Z01419 Encounter for gynecological examination (general) (routine) without abnormal findings: Secondary | ICD-10-CM | POA: Diagnosis not present

## 2021-05-27 DIAGNOSIS — Z124 Encounter for screening for malignant neoplasm of cervix: Secondary | ICD-10-CM | POA: Diagnosis not present

## 2021-05-30 DIAGNOSIS — F4323 Adjustment disorder with mixed anxiety and depressed mood: Secondary | ICD-10-CM | POA: Diagnosis not present

## 2021-05-31 DIAGNOSIS — M810 Age-related osteoporosis without current pathological fracture: Secondary | ICD-10-CM | POA: Diagnosis not present

## 2021-05-31 DIAGNOSIS — M85852 Other specified disorders of bone density and structure, left thigh: Secondary | ICD-10-CM | POA: Diagnosis not present

## 2021-05-31 DIAGNOSIS — M85851 Other specified disorders of bone density and structure, right thigh: Secondary | ICD-10-CM | POA: Diagnosis not present

## 2021-06-04 ENCOUNTER — Other Ambulatory Visit: Payer: Self-pay | Admitting: Internal Medicine

## 2021-06-04 DIAGNOSIS — E1142 Type 2 diabetes mellitus with diabetic polyneuropathy: Secondary | ICD-10-CM

## 2021-06-04 DIAGNOSIS — E119 Type 2 diabetes mellitus without complications: Secondary | ICD-10-CM | POA: Diagnosis not present

## 2021-06-04 DIAGNOSIS — H04123 Dry eye syndrome of bilateral lacrimal glands: Secondary | ICD-10-CM | POA: Diagnosis not present

## 2021-06-04 DIAGNOSIS — H40013 Open angle with borderline findings, low risk, bilateral: Secondary | ICD-10-CM | POA: Diagnosis not present

## 2021-06-04 DIAGNOSIS — E118 Type 2 diabetes mellitus with unspecified complications: Secondary | ICD-10-CM

## 2021-06-04 DIAGNOSIS — H2513 Age-related nuclear cataract, bilateral: Secondary | ICD-10-CM | POA: Diagnosis not present

## 2021-06-11 DIAGNOSIS — M810 Age-related osteoporosis without current pathological fracture: Secondary | ICD-10-CM | POA: Diagnosis not present

## 2021-06-13 ENCOUNTER — Other Ambulatory Visit: Payer: Self-pay | Admitting: Internal Medicine

## 2021-06-13 ENCOUNTER — Telehealth: Payer: Self-pay

## 2021-06-13 DIAGNOSIS — E118 Type 2 diabetes mellitus with unspecified complications: Secondary | ICD-10-CM

## 2021-06-13 MED ORDER — TRULICITY 3 MG/0.5ML ~~LOC~~ SOAJ: 3.0000 mg | SUBCUTANEOUS | 0 refills | Status: DC

## 2021-06-13 NOTE — Telephone Encounter (Signed)
Pt is reporting that Blood sugars have been elevated over the last 3 weeks.  Pt readings have ranged from 132-270.  Pt is currently taking  Dulaglutide (TRULICITY) 3.88 EK/8.0KL SOPN FARXIGA 10 MG TABS tablet  Pt states that she has been walking daily and nothing in diet has changed but states she hasn't been sleeping well and is under stress.  Please advise  Pt CB [phone]

## 2021-06-14 NOTE — Telephone Encounter (Signed)
I called pt to let her know that Dr. Ronnald Ramp increase  Dulaglutide (TRULICITY) 3 XG/3.3PO SOPN.  New Rx sent to pharmacy.  FYI

## 2021-06-27 DIAGNOSIS — F4323 Adjustment disorder with mixed anxiety and depressed mood: Secondary | ICD-10-CM | POA: Diagnosis not present

## 2021-07-04 ENCOUNTER — Other Ambulatory Visit: Payer: Self-pay | Admitting: Internal Medicine

## 2021-07-05 ENCOUNTER — Other Ambulatory Visit: Payer: Self-pay | Admitting: Internal Medicine

## 2021-08-05 DIAGNOSIS — F4323 Adjustment disorder with mixed anxiety and depressed mood: Secondary | ICD-10-CM | POA: Diagnosis not present

## 2021-08-06 ENCOUNTER — Other Ambulatory Visit: Payer: Self-pay | Admitting: Internal Medicine

## 2021-08-06 DIAGNOSIS — E118 Type 2 diabetes mellitus with unspecified complications: Secondary | ICD-10-CM

## 2021-08-14 LAB — HM DEXA SCAN

## 2021-08-21 ENCOUNTER — Encounter: Payer: Self-pay | Admitting: Internal Medicine

## 2021-08-21 ENCOUNTER — Ambulatory Visit (INDEPENDENT_AMBULATORY_CARE_PROVIDER_SITE_OTHER): Payer: BC Managed Care – PPO | Admitting: Internal Medicine

## 2021-08-21 VITALS — BP 124/76 | HR 68 | Temp 98.0°F | Resp 16 | Ht 66.0 in | Wt 184.0 lb

## 2021-08-21 DIAGNOSIS — M810 Age-related osteoporosis without current pathological fracture: Secondary | ICD-10-CM | POA: Insufficient documentation

## 2021-08-21 DIAGNOSIS — I10 Essential (primary) hypertension: Secondary | ICD-10-CM

## 2021-08-21 DIAGNOSIS — E118 Type 2 diabetes mellitus with unspecified complications: Secondary | ICD-10-CM | POA: Diagnosis not present

## 2021-08-21 DIAGNOSIS — N952 Postmenopausal atrophic vaginitis: Secondary | ICD-10-CM | POA: Insufficient documentation

## 2021-08-21 LAB — URINALYSIS, ROUTINE W REFLEX MICROSCOPIC
Bilirubin Urine: NEGATIVE
Ketones, ur: NEGATIVE
Leukocytes,Ua: NEGATIVE
Nitrite: NEGATIVE
Specific Gravity, Urine: <=1.005 — AB (ref 1.000–1.030)
Total Protein, Urine: NEGATIVE
Urine Glucose: >=1000 — AB
Urobilinogen, UA: 0.2 (ref 0.0–1.0)
pH: 6 (ref 5.0–8.0)

## 2021-08-21 LAB — MICROALBUMIN / CREATININE URINE RATIO
Creatinine,U: 43.5 mg/dL
Microalb Creat Ratio: 1.6 mg/g (ref 0.0–30.0)
Microalb, Ur: <0.7 mg/dL (ref 0.0–1.9)

## 2021-08-21 LAB — BASIC METABOLIC PANEL
BUN: 16 mg/dL (ref 6–23)
CO2: 30 mEq/L (ref 19–32)
Calcium: 9.6 mg/dL (ref 8.4–10.5)
Chloride: 100 mEq/L (ref 96–112)
Creatinine, Ser: 0.85 mg/dL (ref 0.40–1.20)
GFR: 73.81 mL/min (ref >60.00)
Glucose, Bld: 124 mg/dL — ABNORMAL HIGH (ref 70–99)
Potassium: 4 mEq/L (ref 3.5–5.1)
Sodium: 138 mEq/L (ref 135–145)

## 2021-08-21 LAB — CBC WITH DIFFERENTIAL/PLATELET
Basophils Absolute: 0 10*3/uL (ref 0.0–0.1)
Basophils Relative: 0.7 % (ref 0.0–3.0)
Eosinophils Absolute: 0.3 10*3/uL (ref 0.0–0.7)
Eosinophils Relative: 3.9 % (ref 0.0–5.0)
HCT: 41.1 % (ref 36.0–46.0)
Hemoglobin: 14 g/dL (ref 12.0–15.0)
Lymphocytes Relative: 37.5 % (ref 12.0–46.0)
Lymphs Abs: 2.6 10*3/uL (ref 0.7–4.0)
MCHC: 33.9 g/dL (ref 30.0–36.0)
MCV: 96.1 fl (ref 78.0–100.0)
Monocytes Absolute: 0.8 10*3/uL (ref 0.1–1.0)
Monocytes Relative: 11.7 % (ref 3.0–12.0)
Neutro Abs: 3.2 10*3/uL (ref 1.4–7.7)
Neutrophils Relative %: 46.2 % (ref 43.0–77.0)
Platelets: 364 10*3/uL (ref 150.0–400.0)
RBC: 4.28 Mil/uL (ref 3.87–5.11)
RDW: 13.4 % (ref 11.5–15.5)
WBC: 6.9 10*3/uL (ref 4.0–10.5)

## 2021-08-21 LAB — HEMOGLOBIN A1C: Hgb A1c MFr Bld: 5.5 % (ref 4.6–6.5)

## 2021-08-21 NOTE — Patient Instructions (Signed)

## 2021-08-21 NOTE — Progress Notes (Signed)
Subjective:  Patient ID: [name], female    DOB: 08-14-1960  Age: 61 y.o. MRN: [id number]  CC: Hypertension and Diabetes   HPI [name] presents for f/up -   She is active and denies chest pain, shortness of breath, diaphoresis, edema, or fatigue.  Outpatient Medications Prior to Visit  Medication Sig Dispense Refill   atorvastatin (LIPITOR) 10 MG tablet TAKE 1 TABLET BY MOUTH EVERY DAY 90 tablet 1   buPROPion (WELLBUTRIN XL) 300 MG 24 hr tablet Take 300 mg by mouth daily.  1   cetirizine (ZYRTEC) 10 MG tablet Take 10 mg by mouth daily.     citalopram (CELEXA) 40 MG tablet TAKE 1 TABLET BY MOUTH EVERY DAY 90 tablet 1   Continuous Blood Gluc Receiver (FREESTYLE LIBRE 2 READER) DEVI 1 Act by Does not apply route daily. 2 each 5   Continuous Blood Gluc Sensor (FREESTYLE LIBRE 2 SENSOR) MISC 1 Act by Does not apply route daily. 2 each 5   Cyanocobalamin (VITAMIN B12) 1000 MCG TBCR Take by mouth.     FARXIGA 10 MG TABS tablet TAKE 1 TABLET BY MOUTH EVERY DAY BEFORE BREAKFAST 90 tablet 1   glucosamine-chondroitin 500-400 MG tablet Take by mouth 3 (three) times daily.     pantoprazole (PROTONIX) 40 MG tablet Take 1 tablet (40 mg total) by mouth daily. 90 tablet 1   ramipril (ALTACE) 5 MG capsule Take 5 mg by mouth daily.  1   VITAMIN D PO Take 5,000 Units by mouth.     Dulaglutide (TRULICITY) 3 LD/3.5TS SOPN Inject 3 mg as directed once a week. 6 mL 0   Atorvastatin Calcium (LIPITOR PO) Take by mouth.     No facility-administered medications prior to visit.    ROS Review of Systems  Constitutional: Negative.  Negative for diaphoresis and fatigue.  HENT: Negative.    Eyes: Negative.   Respiratory:  Negative for cough, chest tightness, shortness of breath and wheezing.   Cardiovascular:  Negative for chest pain, palpitations and leg swelling.  Gastrointestinal:  Negative for abdominal pain, constipation, diarrhea, nausea and vomiting.  Endocrine: Negative.    Genitourinary: Negative.  Negative for difficulty urinating.  Musculoskeletal: Negative.  Negative for arthralgias.  Skin: Negative.   Neurological:  Negative for dizziness, weakness, light-headedness and headaches.  Hematological:  Negative for adenopathy. Does not bruise/bleed easily.  Psychiatric/Behavioral: Negative.      Objective:  BP 124/76 (BP Location: Right Arm, Patient Position: Sitting, Cuff Size: Large)   Pulse 68   Temp 98 F (36.7 C) (Oral)   Resp 16   Ht '5\' 6"'$  (1.676 m)   Wt 184 lb (83.5 kg)   LMP 02/02/2013   SpO2 97%   BMI 29.70 kg/m   BP Readings from Last 3 Encounters:  08/21/21 124/76  02/07/21 130/80  09/10/20 122/86    Wt Readings from Last 3 Encounters:  08/21/21 184 lb (83.5 kg)  03/05/21 188 lb 8 oz (85.5 kg)  02/07/21 184 lb (83.5 kg)    Physical Exam Vitals reviewed.  HENT:     Nose: Nose normal.     Mouth/Throat:     Mouth: Mucous membranes are moist.  Eyes:     General: No scleral icterus.    Conjunctiva/sclera: Conjunctivae normal.  Cardiovascular:     Rate and Rhythm: Normal rate and regular rhythm.     Heart sounds: No murmur heard. Pulmonary:     Effort: Pulmonary effort is normal.  Breath sounds: No stridor. No wheezing, rhonchi or rales.  Abdominal:     General: Abdomen is flat.     Palpations: There is no mass.     Tenderness: There is no abdominal tenderness. There is no guarding.     Hernia: No hernia is present.  Musculoskeletal:        General: Normal range of motion.     Cervical back: Neck supple.     Right lower leg: No edema.     Left lower leg: No edema.  Lymphadenopathy:     Cervical: No cervical adenopathy.  Skin:    General: Skin is warm and dry.  Neurological:     General: No focal deficit present.     Mental Status: She is alert.  Psychiatric:        Mood and Affect: Mood normal.        Behavior: Behavior normal.     Lab Results  Component Value Date   WBC 6.9 08/21/2021   HGB 14.0  08/21/2021   HCT 41.1 08/21/2021   PLT 364.0 08/21/2021   GLUCOSE 124 (H) 08/21/2021   CHOL 152 02/07/2021   TRIG 83.0 02/07/2021   HDL 47.80 02/07/2021   LDLCALC 88 02/07/2021   ALT 18 02/07/2021   AST 15 02/07/2021   NA 138 08/21/2021   K 4.0 08/21/2021   CL 100 08/21/2021   CREATININE 0.85 08/21/2021   BUN 16 08/21/2021   CO2 30 08/21/2021   TSH 2.39 09/10/2020   INR 1.0 09/18/2006   HGBA1C 7.2 (H) 08/27/2021   MICROALBUR <0.7 08/21/2021    No results found.  Assessment & Plan:   [name] was seen today for hypertension and diabetes.  Diagnoses and all orders for this visit:  Primary hypertension-blood pressure is well controlled.  Electrolytes and renal function are normal. -     Basic metabolic panel; Future -     CBC with Differential/Platelet; Future -     Urinalysis, Routine w reflex microscopic; Future -     Urinalysis, Routine w reflex microscopic -     CBC with Differential/Platelet -     Basic metabolic panel  Type II diabetes mellitus with manifestations (HCC)-[name] blood sugar is adequately well controlled. -     Basic metabolic panel; Future -     Hemoglobin A1c; Future -     Microalbumin / creatinine urine ratio; Future -     Microalbumin / creatinine urine ratio -     Hemoglobin A1c -     Basic metabolic panel -     Hemoglobin A1c; Future   I have discontinued [name] Atorvastatin Calcium (LIPITOR PO). I am also having [name] maintain [name] buPROPion, ramipril, cetirizine, glucosamine-chondroitin, Vitamin B12, VITAMIN D PO, FreeStyle Libre 2 Reader, pantoprazole, FreeStyle Libre 2 Sensor, [place], citalopram, and atorvastatin.  No orders of the defined types were placed in this encounter.    Follow-up: Return in about 6 months (around 02/21/2022).  [name], MD

## 2021-08-23 ENCOUNTER — Encounter: Payer: Self-pay | Admitting: Internal Medicine

## 2021-08-23 ENCOUNTER — Other Ambulatory Visit: Payer: Self-pay | Admitting: Internal Medicine

## 2021-08-23 DIAGNOSIS — E118 Type 2 diabetes mellitus with unspecified complications: Secondary | ICD-10-CM

## 2021-08-27 ENCOUNTER — Other Ambulatory Visit (INDEPENDENT_AMBULATORY_CARE_PROVIDER_SITE_OTHER): Payer: BC Managed Care – PPO

## 2021-08-27 DIAGNOSIS — E118 Type 2 diabetes mellitus with unspecified complications: Secondary | ICD-10-CM | POA: Diagnosis not present

## 2021-08-27 LAB — HEMOGLOBIN A1C: Hgb A1c MFr Bld: 7.2 % — ABNORMAL HIGH (ref 4.6–6.5)

## 2021-08-28 ENCOUNTER — Other Ambulatory Visit: Payer: Self-pay | Admitting: Internal Medicine

## 2021-08-28 DIAGNOSIS — I10 Essential (primary) hypertension: Secondary | ICD-10-CM

## 2021-08-28 DIAGNOSIS — E118 Type 2 diabetes mellitus with unspecified complications: Secondary | ICD-10-CM

## 2021-08-28 MED ORDER — RAMIPRIL 5 MG PO CAPS: 5.0000 mg | ORAL_CAPSULE | Freq: Every day | ORAL | 1 refills | Status: DC

## 2021-08-28 NOTE — Telephone Encounter (Signed)
Pls advise on refill med never been refilled by MD../lmb

## 2021-09-03 ENCOUNTER — Encounter: Payer: Self-pay | Admitting: Internal Medicine

## 2021-09-04 NOTE — Telephone Encounter (Signed)
Pt called to advise she thinks today is her 5th day being ill.   Fyi

## 2021-09-09 DIAGNOSIS — F4323 Adjustment disorder with mixed anxiety and depressed mood: Secondary | ICD-10-CM | POA: Diagnosis not present

## 2021-09-12 ENCOUNTER — Telehealth: Payer: Self-pay

## 2021-09-12 NOTE — Telephone Encounter (Signed)
Pt is also requesting to change from [place] 2 because she says she is using more sensor due to them coming off which is becoming more costly. She is wanting to try the one that is placed on your abdomen like Dexcom she thinks.  Pt is now also wanting to get the nerve testing done now for legs and feet.

## 2021-09-12 NOTE — Telephone Encounter (Signed)
Pt is requesting a referral to GI for colonoscopy.  [facility] is the place of choice

## 2021-09-13 ENCOUNTER — Other Ambulatory Visit: Payer: Self-pay | Admitting: Internal Medicine

## 2021-09-13 ENCOUNTER — Telehealth: Payer: Self-pay | Admitting: Internal Medicine

## 2021-09-13 DIAGNOSIS — E118 Type 2 diabetes mellitus with unspecified complications: Secondary | ICD-10-CM

## 2021-09-13 DIAGNOSIS — E1142 Type 2 diabetes mellitus with diabetic polyneuropathy: Secondary | ICD-10-CM

## 2021-09-13 MED ORDER — DEXCOM G7 RECEIVER DEVI: 1.0000 | Freq: Every day | 1 refills | Status: DC

## 2021-09-13 MED ORDER — DEXCOM G7 SENSOR MISC: 1.0000 | Freq: Every day | 1 refills | Status: DC

## 2021-09-21 ENCOUNTER — Other Ambulatory Visit: Payer: Self-pay | Admitting: Internal Medicine

## 2021-09-21 DIAGNOSIS — E118 Type 2 diabetes mellitus with unspecified complications: Secondary | ICD-10-CM

## 2021-09-21 MED ORDER — DEXCOM G7 SENSOR MISC: 1.0000 | Freq: Every day | 1 refills | Status: DC

## 2021-09-21 MED ORDER — DEXCOM G7 RECEIVER DEVI: 1.0000 | Freq: Every day | 1 refills | Status: DC

## 2021-09-25 DIAGNOSIS — F4323 Adjustment disorder with mixed anxiety and depressed mood: Secondary | ICD-10-CM | POA: Diagnosis not present

## 2021-09-26 ENCOUNTER — Other Ambulatory Visit: Payer: Self-pay | Admitting: Internal Medicine

## 2021-09-26 DIAGNOSIS — K219 Gastro-esophageal reflux disease without esophagitis: Secondary | ICD-10-CM

## 2021-09-26 DIAGNOSIS — Z1211 Encounter for screening for malignant neoplasm of colon: Secondary | ICD-10-CM | POA: Insufficient documentation

## 2021-10-03 ENCOUNTER — Encounter: Payer: Self-pay | Admitting: Gastroenterology

## 2021-10-03 ENCOUNTER — Encounter: Payer: Self-pay | Admitting: Neurology

## 2021-10-08 ENCOUNTER — Ambulatory Visit (INDEPENDENT_AMBULATORY_CARE_PROVIDER_SITE_OTHER): Payer: BC Managed Care – PPO

## 2021-10-08 ENCOUNTER — Ambulatory Visit (INDEPENDENT_AMBULATORY_CARE_PROVIDER_SITE_OTHER): Payer: BC Managed Care – PPO | Admitting: Internal Medicine

## 2021-10-08 ENCOUNTER — Encounter: Payer: Self-pay | Admitting: Internal Medicine

## 2021-10-08 VITALS — BP 122/76 | HR 75 | Temp 98.3°F | Ht 66.0 in | Wt 178.0 lb

## 2021-10-08 DIAGNOSIS — R10817 Generalized abdominal tenderness: Secondary | ICD-10-CM | POA: Insufficient documentation

## 2021-10-08 DIAGNOSIS — Z23 Encounter for immunization: Secondary | ICD-10-CM | POA: Diagnosis not present

## 2021-10-08 DIAGNOSIS — R109 Unspecified abdominal pain: Secondary | ICD-10-CM | POA: Diagnosis not present

## 2021-10-08 DIAGNOSIS — D75839 Thrombocytosis, unspecified: Secondary | ICD-10-CM | POA: Diagnosis not present

## 2021-10-08 LAB — CBC WITH DIFFERENTIAL/PLATELET
Basophils Absolute: 0 10*3/uL (ref 0.0–0.1)
Basophils Relative: 0.5 % (ref 0.0–3.0)
Eosinophils Absolute: 0.2 10*3/uL (ref 0.0–0.7)
Eosinophils Relative: 2.9 % (ref 0.0–5.0)
HCT: 40.5 % (ref 36.0–46.0)
Hemoglobin: 13.8 g/dL (ref 12.0–15.0)
Lymphocytes Relative: 38.3 % (ref 12.0–46.0)
Lymphs Abs: 2.9 10*3/uL (ref 0.7–4.0)
MCHC: 34.2 g/dL (ref 30.0–36.0)
MCV: 95.8 fl (ref 78.0–100.0)
Monocytes Absolute: 1 10*3/uL (ref 0.1–1.0)
Monocytes Relative: 12.8 % — ABNORMAL HIGH (ref 3.0–12.0)
Neutro Abs: 3.5 10*3/uL (ref 1.4–7.7)
Neutrophils Relative %: 45.5 % (ref 43.0–77.0)
Platelets: 403 10*3/uL — ABNORMAL HIGH (ref 150.0–400.0)
RBC: 4.23 Mil/uL (ref 3.87–5.11)
RDW: 13.1 % (ref 11.5–15.5)
WBC: 7.7 10*3/uL (ref 4.0–10.5)

## 2021-10-08 LAB — URINALYSIS, ROUTINE W REFLEX MICROSCOPIC
Bilirubin Urine: NEGATIVE
Hgb urine dipstick: NEGATIVE
Ketones, ur: NEGATIVE
Nitrite: NEGATIVE
Specific Gravity, Urine: 1.02 (ref 1.000–1.030)
Total Protein, Urine: NEGATIVE
Urine Glucose: >=1000 — AB
Urobilinogen, UA: 0.2 (ref 0.0–1.0)
pH: 6 (ref 5.0–8.0)

## 2021-10-08 LAB — BASIC METABOLIC PANEL
BUN: 20 mg/dL (ref 6–23)
CO2: 30 mEq/L (ref 19–32)
Calcium: 9.7 mg/dL (ref 8.4–10.5)
Chloride: 101 mEq/L (ref 96–112)
Creatinine, Ser: 0.85 mg/dL (ref 0.40–1.20)
GFR: 73.74 mL/min (ref >60.00)
Glucose, Bld: 90 mg/dL (ref 70–99)
Potassium: 3.9 mEq/L (ref 3.5–5.1)
Sodium: 137 mEq/L (ref 135–145)

## 2021-10-08 LAB — HEPATIC FUNCTION PANEL
ALT: 17 U/L (ref 0–35)
AST: 13 U/L (ref 0–37)
Albumin: 4.4 g/dL (ref 3.5–5.2)
Alkaline Phosphatase: 70 U/L (ref 39–117)
Bilirubin, Direct: 0.1 mg/dL (ref 0.0–0.3)
Total Bilirubin: 0.6 mg/dL (ref 0.2–1.2)
Total Protein: 7.9 g/dL (ref 6.0–8.3)

## 2021-10-08 LAB — AMYLASE: Amylase: 35 U/L (ref 27–131)

## 2021-10-08 LAB — LIPASE: Lipase: 49 U/L (ref 11.0–59.0)

## 2021-10-08 NOTE — Progress Notes (Addendum)
Subjective:  Patient ID: [name], female    DOB: 1960/09/21  Age: 61 y.o. MRN: [id number]  CC: Abdominal Pain   HPI [name] presents for f/up -  She complains of a 3-week history of abdominal pain with diarrhea (3 bowel movements per day).  She has had early satiety and some weight loss.  Her stool turned black after she took Pepto-Bismol.  She denies nausea or vomiting.  She last took Motrin about 10 days ago.  She also has a headache that she describes as a generalized achy sensation.  Outpatient Medications Prior to Visit  Medication Sig Dispense Refill   atorvastatin (LIPITOR) 10 MG tablet TAKE 1 TABLET BY MOUTH EVERY DAY 90 tablet 1   buPROPion (WELLBUTRIN XL) 300 MG 24 hr tablet Take 300 mg by mouth daily.  1   cetirizine (ZYRTEC) 10 MG tablet Take 10 mg by mouth daily.     citalopram (CELEXA) 40 MG tablet TAKE 1 TABLET BY MOUTH EVERY DAY 90 tablet 1   Continuous Blood Gluc Receiver (DEXCOM G7 RECEIVER) DEVI 1 Act by Does not apply route daily. 9 each 1   Continuous Blood Gluc Sensor (DEXCOM G7 SENSOR) MISC 1 Act by Does not apply route daily. 9 each 1   Cyanocobalamin (VITAMIN B12) 1000 MCG TBCR Take by mouth.     Dulaglutide (TRULICITY) 3 TF/5.7DU SOPN INJECT 3 MG AS DIRECTED ONCE A WEEK. 2 mL 3   FARXIGA 10 MG TABS tablet TAKE 1 TABLET BY MOUTH EVERY DAY BEFORE BREAKFAST 90 tablet 1   glucosamine-chondroitin 500-400 MG tablet Take by mouth 3 (three) times daily.     pantoprazole (PROTONIX) 40 MG tablet Take 1 tablet (40 mg total) by mouth daily. 90 tablet 1   ramipril (ALTACE) 5 MG capsule Take 1 capsule (5 mg total) by mouth daily. 90 capsule 1   VITAMIN D PO Take 5,000 Units by mouth.     No facility-administered medications prior to visit.    ROS Review of Systems  Constitutional:  Positive for appetite change and unexpected weight change. Negative for chills, diaphoresis, fatigue and fever.  HENT: Negative.    Eyes: Negative.   Respiratory:  Negative  for cough, chest tightness, shortness of breath and wheezing.   Cardiovascular:  Negative for chest pain, palpitations and leg swelling.  Gastrointestinal:  Positive for abdominal pain and diarrhea. Negative for blood in stool, constipation, nausea and vomiting.  Endocrine: Negative.   Genitourinary: Negative.  Negative for difficulty urinating, dysuria, frequency and hematuria.  Musculoskeletal: Negative.   Skin: Negative.   Neurological:  Positive for headaches. Negative for dizziness and weakness.  Hematological:  Negative for adenopathy. Does not bruise/bleed easily.  Psychiatric/Behavioral: Negative.      Objective:  BP 122/76 (BP Location: Right Arm, Patient Position: Sitting, Cuff Size: Normal)   Pulse 75   Temp 98.3 F (36.8 C) (Oral)   Ht '5\' 6"'$  (1.676 m)   Wt 178 lb (80.7 kg)   LMP 02/02/2013   SpO2 98%   BMI 28.73 kg/m   BP Readings from Last 3 Encounters:  10/08/21 122/76  08/21/21 124/76  02/07/21 130/80    Wt Readings from Last 3 Encounters:  10/08/21 178 lb (80.7 kg)  08/21/21 184 lb (83.5 kg)  03/05/21 188 lb 8 oz (85.5 kg)    Physical Exam Vitals reviewed. Exam conducted with a chaperone present (Shirron).  Constitutional:      General: She is not in acute distress.  Appearance: She is not ill-appearing, toxic-appearing or diaphoretic.  HENT:     Nose: Nose normal.     Mouth/Throat:     Mouth: Mucous membranes are moist.  Eyes:     General: No scleral icterus.    Conjunctiva/sclera: Conjunctivae normal.  Cardiovascular:     Rate and Rhythm: Normal rate and regular rhythm.     Heart sounds: No murmur heard. Pulmonary:     Effort: Pulmonary effort is normal.     Breath sounds: No stridor. No wheezing, rhonchi or rales.  Abdominal:     General: Abdomen is flat. Bowel sounds are decreased.     Palpations: There is no hepatomegaly, splenomegaly or mass.     Tenderness: There is abdominal tenderness in the right lower quadrant, periumbilical area  and left upper quadrant. There is no guarding or rebound.  Genitourinary:    Rectum: Guaiac result negative. No mass, tenderness, anal fissure, external hemorrhoid or internal hemorrhoid. Normal anal tone.  Musculoskeletal:        General: Normal range of motion.     Cervical back: Neck supple.     Right lower leg: No edema.     Left lower leg: No edema.  Lymphadenopathy:     Cervical: No cervical adenopathy.  Skin:    General: Skin is warm and dry.  Neurological:     General: No focal deficit present.     Mental Status: She is alert.  Psychiatric:        Mood and Affect: Mood normal.        Behavior: Behavior normal.     Lab Results  Component Value Date   WBC 7.7 10/08/2021   HGB 13.8 10/08/2021   HCT 40.5 10/08/2021   PLT 403.0 (H) 10/08/2021   GLUCOSE 90 10/08/2021   CHOL 152 02/07/2021   TRIG 83.0 02/07/2021   HDL 47.80 02/07/2021   LDLCALC 88 02/07/2021   ALT 17 10/08/2021   AST 13 10/08/2021   NA 137 10/08/2021   K 3.9 10/08/2021   CL 101 10/08/2021   CREATININE 0.85 10/08/2021   BUN 20 10/08/2021   CO2 30 10/08/2021   TSH 2.39 09/10/2020   INR 1.0 09/18/2006   HGBA1C 7.2 (H) 08/27/2021   MICROALBUR <0.7 08/21/2021    DG ABD ACUTE 2+V W 1V CHEST  Result Date: 10/08/2021 CLINICAL DATA:  Abdominal pain with nausea and vomiting. History of hypertension and diabetes. Former smoker. EXAM: DG ABDOMEN ACUTE WITH 1 VIEW CHEST COMPARISON:  Chest radiograph 05/07/2020 FINDINGS: There is no evidence of dilated bowel loops or free intraperitoneal air. No radiopaque calculi or other significant radiographic abnormality is seen. Heart size and mediastinal contours are within normal limits. No focal consolidative opacity. There is a focal retrocardiac well-circumscribed opacity on the left, unchanged from prior exam from April 22, but new compared to 02/05/2014 IMPRESSION: Negative abdominal radiographs. No acute cardiopulmonary disease. Focal nodular well-circumscribed  retrocardiac opacity is nonspecific and may be artifactual. If clinically indicated, this could be further assessed with a chest CT. Electronically Signed   By: Marin Roberts M.D.   On: 10/08/2021 09:42     Assessment & Plan:   [name] was seen today for abdominal pain.  Diagnoses and all orders for this visit:  Generalized abdominal tenderness without rebound tenderness- She has abdominal pain with some other concerning constitutional symptoms.  Her anion gap is normal at 10.  Her platelet count is elevated.  Other labs are normal.  I recommended  a CT scan with contrast to search for the cause of her abdominal pain. -     Basic metabolic panel; Future -     Lipase; Future -     Amylase; Future -     CBC with Differential/Platelet; Future -     Urinalysis, Routine w reflex microscopic; Future -     Hepatic function panel; Future -     DG ABD ACUTE 2+V W 1V CHEST; Future -     Hepatic function panel -     Urinalysis, Routine w reflex microscopic -     CBC with Differential/Platelet -     Amylase -     Lipase -     Basic metabolic panel -     CT Abdomen Pelvis W Contrast; Future  Thrombocytosis- I recommended a CT scan to screen for lymphadenopathy and splenomegaly. -     CT Abdomen Pelvis W Contrast; Future  Other orders -     Flu Vaccine QUAD 6+ mos PF IM (Fluarix Quad PF)   I am having [name] maintain her buPROPion, cetirizine, glucosamine-chondroitin, Vitamin B12, VITAMIN D PO, pantoprazole, Farxiga, citalopram, atorvastatin, Trulicity, ramipril, Dexcom G7 Receiver, and Dexcom G7 Sensor.  No orders of the defined types were placed in this encounter.  I spent 45 minutes in preparing to see the patient by review of recent labs, obtaining and reviewing separately obtained history, communicating with the patient, ordering labs and xrays, and documenting clinical information in the EHR including the differential Dx, treatment, and any further evaluation and management of  multiple complex medical issues.     Follow-up: Return in about 1 week (around 10/15/2021).  [name], MD

## 2021-10-08 NOTE — Patient Instructions (Signed)

## 2021-10-14 ENCOUNTER — Telehealth: Payer: Self-pay

## 2021-10-14 NOTE — Telephone Encounter (Signed)
Curt Bears is calling from [employer] checking on the prior auth that needed to be submitted for the Dexcom. Wondering if we can give them a call back.

## 2021-10-14 NOTE — Telephone Encounter (Addendum)
PA's were received today.   Receiver Key: [id number] Approved Effective from 10/14/2021 through 10/13/2022.  Sensor Key: [id number] Approved Effective from 10/14/2021 through 10/13/2022.

## 2021-10-15 DIAGNOSIS — F4323 Adjustment disorder with mixed anxiety and depressed mood: Secondary | ICD-10-CM | POA: Diagnosis not present

## 2021-10-17 ENCOUNTER — Other Ambulatory Visit: Payer: Self-pay | Admitting: Internal Medicine

## 2021-10-17 ENCOUNTER — Telehealth: Payer: Self-pay | Admitting: Internal Medicine

## 2021-10-17 NOTE — Telephone Encounter (Signed)
Pt has been informed and expressed understanding. She will give [place] an update in a few days.

## 2021-10-17 NOTE — Telephone Encounter (Signed)
Patient states that her stomach issue has not gotten any better.  Patient wants to know if she should stop the farxiga or the trulicity - her blood sugar is running low.  Please advise.

## 2021-10-18 ENCOUNTER — Encounter: Payer: Self-pay | Admitting: Neurology

## 2021-10-18 ENCOUNTER — Ambulatory Visit (INDEPENDENT_AMBULATORY_CARE_PROVIDER_SITE_OTHER): Payer: BC Managed Care – PPO | Admitting: Neurology

## 2021-10-18 ENCOUNTER — Other Ambulatory Visit (INDEPENDENT_AMBULATORY_CARE_PROVIDER_SITE_OTHER): Payer: BC Managed Care – PPO

## 2021-10-18 VITALS — BP 117/83 | HR 80 | Ht 66.0 in | Wt 179.0 lb

## 2021-10-18 DIAGNOSIS — E1142 Type 2 diabetes mellitus with diabetic polyneuropathy: Secondary | ICD-10-CM | POA: Diagnosis not present

## 2021-10-18 LAB — B12 AND FOLATE PANEL
Folate: 9.6 ng/mL (ref >5.9)
Vitamin B-12: 544 pg/mL (ref 211–911)

## 2021-10-18 LAB — C-REACTIVE PROTEIN: CRP: <1 mg/dL (ref 0.5–20.0)

## 2021-10-18 LAB — SEDIMENTATION RATE: Sed Rate: 29 mm/hr (ref 0–30)

## 2021-10-18 NOTE — Patient Instructions (Signed)
Check labs  You may try over-the-counter lidocaine ointment (Salonpas or Aspercream)

## 2021-10-18 NOTE — Progress Notes (Signed)
[facility] Clinic Note - Initial Visit   Date: 10/18/2021   [name] MRN: [id number] DOB: 11/08/1960   Dear Dr. Ronnald Ramp:   Thank you for your kind referral of [name] for consultation of neuropathy. Although her history is well known to you, please allow [place] to reiterate it for the purpose of our medical record. The patient was accompanied to the clinic by self.    [name] is a 61 y.o. right-handed female with diabetes mellitus, hyperlipidemia, depression,  and hypertension presenting for evaluation of neuropathy.   IMPRESSION/PLAN: Diabetic polyneuropathy affecting the lower extremities, manifesting with burning paresthesias and distal numbness.  History and exam suggests small > large fiber neuropathy.  NCS/EMG and skin biopsy was discussed, however, given classic history and exam, it was mutually decided to defer testing unless symptoms get worse.    - Check ESR, CRP, vitamin B12, folate, copper, SPEP with IFE  - Patient does not wish to take an pain medications at this time, I offered OTC lidocaine ointment that she may use as needed for severe pain  - Patient educated on daily foot inspection and fall prevention  - Counseled to continue to ooptimize diabetes management to minimize progression of neuropathy  Return to clinic as needed  ------------------------------------------------------------- History of present illness: Starting around 2019, she began having tingling involving the toes which had gradually extended into her lower legs.  She also has numbness in the feet. Occasionally, she also has burning that extends towards her knees.  Burning pain occurs 2-2 times per day, lasting 15-20 minutes.  She has noticed it worse when her blood sugar is at extremes and at night time.  She has not taken any medications for the pain.  She walks unassisted, no significant imbalance.    She works as an Optometrist.  She lives at home with  husband.  Nonsmoker.  Rare alcohol use.    Out-side paper records, electronic medical record, and images have been reviewed where available and summarized as:  Lab Results  Component Value Date   HGBA1C 7.2 (H) 08/27/2021   No results found for: "VITAMINB12" Lab Results  Component Value Date   TSH 2.39 09/10/2020   No results found for: "ESRSEDRATE", "POCTSEDRATE"  Past Medical History:  Diagnosis Date   Depression    Diabetes mellitus without complication ([place])    GERD (gastroesophageal reflux disease)    Hypertension    Sleep apnea     Past Surgical History:  Procedure Laterality Date   DIAGNOSTIC LAPAROSCOPY     DILATION AND CURETTAGE OF UTERUS N/A 05/09/2020   Procedure: CERVICAL DILATATION AND ENDOCERVICAL CURETTAGE;  Surgeon: Drema Dallas, DO;  Location: [facility];  Service: Gynecology;  Laterality: N/A;   LABIOPLASTY Left 08/10/2017   Procedure: LABIAPLASTY REVISION;  Surgeon: Thurnell Lose, MD;  Location: [facility];  Service: Gynecology;  Laterality: Left;  Labiaplasty revision     Medications:  Outpatient Encounter Medications as of 10/18/2021  Medication Sig Note   atorvastatin (LIPITOR) 10 MG tablet TAKE 1 TABLET BY MOUTH EVERY DAY    buPROPion (WELLBUTRIN XL) 300 MG 24 hr tablet Take 300 mg by mouth daily. 02/06/2014: .   citalopram (CELEXA) 40 MG tablet TAKE 1 TABLET BY MOUTH EVERY DAY    Continuous Blood Gluc Receiver (DEXCOM G7 RECEIVER) DEVI 1 Act by Does not apply route daily.    Continuous Blood Gluc Sensor (DEXCOM G7 SENSOR) MISC 1 Act by Does  not apply route daily.    Cyanocobalamin (VITAMIN B12) 1000 MCG TBCR Take by mouth.    FARXIGA 10 MG TABS tablet TAKE 1 TABLET BY MOUTH EVERY DAY BEFORE BREAKFAST    glucosamine-chondroitin 500-400 MG tablet Take by mouth 3 (three) times daily.    pantoprazole (PROTONIX) 40 MG tablet Take 1 tablet (40 mg total) by mouth daily.    ramipril (ALTACE) 5 MG capsule Take 1 capsule (5 mg  total) by mouth daily.    VITAMIN D PO Take 5,000 Units by mouth.    [DISCONTINUED] cetirizine (ZYRTEC) 10 MG tablet Take 10 mg by mouth daily. (Patient not taking: Reported on 10/18/2021)    No facility-administered encounter medications on file as of 10/18/2021.    Allergies:  Allergies  Allergen Reactions   Flagyl [Metronidazole] Diarrhea   Metformin And Related Shortness Of Breath    Family History: Family History  Problem Relation Age of Onset   Dementia Mother    Alzheimer's disease Mother    Hypertension Mother    Rheumatic fever Father    Bipolar disorder Sister    Diabetes Sister    High Cholesterol Sister    Bipolar disorder Brother    Depression Maternal Grandmother    Diabetes Maternal Grandfather    Pancreatic cancer Paternal Grandfather     Social History: Social History   Tobacco Use   Smoking status: Never   Smokeless tobacco: Never  Vaping Use   Vaping Use: Never used  Substance Use Topics   Alcohol use: Yes    Comment: rarely about 2 drinks per month   Drug use: No   Social History   Social History Narrative   Right handed    Lives in a one story home     Vital Signs:  BP 117/83   Pulse 80   Ht 5' 6"  (1.676 m)   Wt 179 lb (81.2 kg)   LMP 02/02/2013   SpO2 98%   BMI 28.89 kg/m   Neurological Exam: MENTAL STATUS including orientation to time, place, person, recent and remote memory, attention span and concentration, language, and fund of knowledge is normal.  Speech is not dysarthric.  CRANIAL NERVES: II:  No visual field defects.    III-IV-VI: Pupils equal round and reactive to light.  Normal conjugate, extra-ocular eye movements in all directions of gaze.  No nystagmus.  No ptosis.   V:  Normal facial sensation.    VII:  Normal facial symmetry and movements.   VIII:  Normal hearing and vestibular function.   IX-X:  Normal palatal movement.   XI:  Normal shoulder shrug and head rotation.   XII:  Normal tongue strength and range of  motion, no deviation or fasciculation.  MOTOR:  No atrophy, fasciculations or abnormal movements.  No pronator drift.   Upper Extremity:  Right  Left  Deltoid  5/5   5/5   Biceps  5/5   5/5   Triceps  5/5   5/5   Finger extensors  5/5   5/5   Finger flexors  5/5   5/5   Dorsal interossei  5/5   5/5   Abductor pollicis  5/5   5/5   Tone (Ashworth scale)  0  0   Lower Extremity:  Right  Left  Hip flexors  5/5   5/5   Knee flexors  5/5   5/5   Knee extensors  5/5   5/5   Dorsiflexors  5/5   5/5  Plantarflexors  5/5   5/5   Toe extensors  5/5   5/5   Toe flexors  5/5   5/5   Tone (Ashworth scale)  0  0   MSRs:                                           Right        Left brachioradialis 2+  2+  biceps 2+  2+  triceps 2+  2+  patellar 2+  2+  ankle jerk 2+  2+  Hoffman no  no  plantar response down  down   SENSORY:  Temperature and pin prick reduced below the ankles.  Vibration and light touch intact throughout, including distally.   Romberg's sign absent.   COORDINATION/GAIT: Normal finger-to- nose-finger.  Intact rapid alternating movements bilaterally.  Gait narrow based and stable. Tandem and stressed gait intact.    Thank you for allowing me to participate in patient's care.  If I can answer any additional questions, I would be pleased to do so.    Sincerely,    Icel Castles K. Posey Pronto, DO

## 2021-10-22 LAB — IMMUNOFIXATION ELECTROPHORESIS
IgG (Immunoglobin G), Serum: 1298 mg/dL (ref 600–1540)
IgM, Serum: 94 mg/dL (ref 50–300)
Immunoglobulin A: 425 mg/dL — ABNORMAL HIGH (ref 70–320)

## 2021-10-22 LAB — PROTEIN ELECTROPHORESIS, SERUM
Albumin ELP: 4.3 g/dL (ref 3.8–4.8)
Alpha 1: 0.3 g/dL (ref 0.2–0.3)
Alpha 2: 0.6 g/dL (ref 0.5–0.9)
Beta 2: 0.5 g/dL (ref 0.2–0.5)
Beta Globulin: 0.5 g/dL (ref 0.4–0.6)
Gamma Globulin: 1.3 g/dL (ref 0.8–1.7)
Total Protein: 7.4 g/dL (ref 6.1–8.1)

## 2021-10-22 LAB — COPPER, SERUM: Copper: 116 ug/dL (ref 70–175)

## 2021-10-31 ENCOUNTER — Ambulatory Visit
Admission: RE | Admit: 2021-10-31 | Discharge: 2021-10-31 | Disposition: A | Discharge status: Home / Self Care | Payer: BC Managed Care – PPO | Source: Ambulatory Visit | Attending: Internal Medicine | Admitting: Internal Medicine

## 2021-10-31 DIAGNOSIS — D75839 Thrombocytosis, unspecified: Secondary | ICD-10-CM

## 2021-10-31 DIAGNOSIS — J984 Other disorders of lung: Secondary | ICD-10-CM | POA: Diagnosis not present

## 2021-10-31 DIAGNOSIS — Z8603 Personal history of neoplasm of uncertain behavior: Secondary | ICD-10-CM | POA: Diagnosis not present

## 2021-10-31 DIAGNOSIS — R109 Unspecified abdominal pain: Secondary | ICD-10-CM | POA: Diagnosis not present

## 2021-10-31 DIAGNOSIS — R10817 Generalized abdominal tenderness: Secondary | ICD-10-CM

## 2021-10-31 DIAGNOSIS — R634 Abnormal weight loss: Secondary | ICD-10-CM | POA: Diagnosis not present

## 2021-10-31 MED ORDER — IOPAMIDOL (ISOVUE-300) INJECTION 61%
100.0000 mL | Freq: Once | INTRAVENOUS | Status: AC | PRN
  Administered 2021-10-31: 100 mL via INTRAVENOUS

## 2021-11-04 ENCOUNTER — Ambulatory Visit (INDEPENDENT_AMBULATORY_CARE_PROVIDER_SITE_OTHER): Payer: BC Managed Care – PPO | Admitting: Gastroenterology

## 2021-11-04 ENCOUNTER — Encounter: Payer: Self-pay | Admitting: Gastroenterology

## 2021-11-04 VITALS — BP 120/82 | HR 78 | Ht 66.0 in | Wt 179.0 lb

## 2021-11-04 DIAGNOSIS — R194 Change in bowel habit: Secondary | ICD-10-CM | POA: Diagnosis not present

## 2021-11-04 DIAGNOSIS — Z1211 Encounter for screening for malignant neoplasm of colon: Secondary | ICD-10-CM

## 2021-11-04 DIAGNOSIS — K219 Gastro-esophageal reflux disease without esophagitis: Secondary | ICD-10-CM

## 2021-11-04 DIAGNOSIS — R142 Eructation: Secondary | ICD-10-CM

## 2021-11-04 DIAGNOSIS — R110 Nausea: Secondary | ICD-10-CM | POA: Diagnosis not present

## 2021-11-04 MED ORDER — NA SULFATE-K SULFATE-MG SULF 17.5-3.13-1.6 GM/177ML PO SOLN: 1.0000 | Freq: Once | ORAL | 0 refills | Status: AC

## 2021-11-04 NOTE — Patient Instructions (Addendum)
Start Benefiber or Citrucel 2 teaspoons in 8 ounces of liquid daily and may increase to twice daily if tolerated.   You have been scheduled for a colonoscopy. Please follow written instructions given to you at your visit today.  Please pick up your prep supplies at the pharmacy within the next 1-3 days. If you use inhalers (even only as needed), please bring them with you on the day of your procedure.  _______________________________________________________  If you are age 53 or older, your body mass index should be between 23-30. Your Body mass index is 28.89 kg/m. If this is out of the aforementioned range listed, please consider follow up with your Primary Care Provider.  If you are age 56 or younger, your body mass index should be between 19-25. Your Body mass index is 28.89 kg/m. If this is out of the aformentioned range listed, please consider follow up with your Primary Care Provider.   ________________________________________________________  The [facility] providers would like to encourage you to use [facility] to communicate with providers for non-urgent requests or questions.  Due to long hold times on the telephone, sending your provider a message by [facility] may be a faster and more efficient way to get a response.  Please allow 48 business hours for a response.  Please remember that this is for non-urgent requests.  _______________________________________________________

## 2021-11-04 NOTE — Progress Notes (Addendum)
11/04/2021 [name] [id number] [birth year]   HISTORY OF PRESENT ILLNESS: This is a 61 year old female who is new to our office.  She presents here today with complaints of abdominal pain, GERD, nausea, belching that has really become an issue over the past 6 or 7 weeks.  She has been has been on pantoprazole 40 mg daily for probably about 10 years or so.  Has never had an EGD in the past.  She is diabetic with most recent hemoglobin A1c of 1.2.  She is on [place] and had been on Trulicity previously as well.  She says that she just feels nauseous all the time, but never vomits.  Often feels poorly in the morning.  Only eats very small amounts of food at a time.  Tells me she had a colonoscopy in 2013 with Dr. Michail Sermon.  She said that she had benign polyps removed.  We do not have those records.  Was referred here by her PCP, Dr. Ronnald Ramp, for colon cancer screening and GERD.   Past Medical History:  Diagnosis Date   Depression    Diabetes mellitus without complication ([place])    GERD (gastroesophageal reflux disease)    Hypertension    Sleep apnea    Past Surgical History:  Procedure Laterality Date   DIAGNOSTIC LAPAROSCOPY     DILATION AND CURETTAGE OF UTERUS N/A 05/09/2020   Procedure: CERVICAL DILATATION AND ENDOCERVICAL CURETTAGE;  Surgeon: Drema Dallas, DO;  Location: [facility];  Service: Gynecology;  Laterality: N/A;   LABIOPLASTY Left 08/10/2017   Procedure: LABIAPLASTY REVISION;  Surgeon: Thurnell Lose, MD;  Location: [facility];  Service: Gynecology;  Laterality: Left;  Labiaplasty revision    reports that she has never smoked. She has never used smokeless tobacco. She reports current alcohol use. She reports that she does not use drugs. family history includes Alzheimer's disease in her mother; Bipolar disorder in her brother and sister; Dementia in her mother; Depression in her maternal grandmother; Diabetes in her maternal  grandfather and sister; High Cholesterol in her sister; Hypertension in her mother; Pancreatic cancer in her paternal grandfather; Rheumatic fever in her father. Allergies  Allergen Reactions   Flagyl [Metronidazole] Diarrhea   Metformin And Related Shortness Of Breath      Outpatient Encounter Medications as of 11/04/2021  Medication Sig   atorvastatin (LIPITOR) 10 MG tablet TAKE 1 TABLET BY MOUTH EVERY DAY   buPROPion (WELLBUTRIN XL) 300 MG 24 hr tablet Take 300 mg by mouth daily.   citalopram (CELEXA) 40 MG tablet TAKE 1 TABLET BY MOUTH EVERY DAY   Continuous Blood Gluc Receiver (DEXCOM G7 RECEIVER) DEVI 1 Act by Does not apply route daily.   Continuous Blood Gluc Sensor (DEXCOM G7 SENSOR) MISC 1 Act by Does not apply route daily.   Cyanocobalamin (VITAMIN B12) 1000 MCG TBCR Take by mouth.   FARXIGA 10 MG TABS tablet TAKE 1 TABLET BY MOUTH EVERY DAY BEFORE BREAKFAST   glucosamine-chondroitin 500-400 MG tablet Take by mouth 3 (three) times daily.   pantoprazole (PROTONIX) 40 MG tablet Take 1 tablet (40 mg total) by mouth daily.   ramipril (ALTACE) 5 MG capsule Take 1 capsule (5 mg total) by mouth daily.   VITAMIN D PO Take 5,000 Units by mouth.   No facility-administered encounter medications on file as of 11/04/2021.    REVIEW OF SYSTEMS  : All other systems reviewed and negative except where noted in the History of Present Illness.  PHYSICAL EXAM: BP 120/82   Pulse 78   Ht '5\' 6"'$  (1.676 m)   Wt 179 lb (81.2 kg)   LMP 02/02/2013   SpO2 98%   BMI 28.89 kg/m  General: Well developed white female in no acute distress Head: Normocephalic and atraumatic Eyes:  Sclerae anicteric,conjunctive pink. Ears: Normal auditory acuity Lungs: Clear throughout to auscultation; no W/R/R. Heart: Regular rate and rhythm; no M/R/G. Abdomen: Soft, non-distended.  BS present.  Non-tender. Rectal:  Will be done at the time of colonoscopy. Musculoskeletal: Symmetrical with no gross deformities   Skin: No lesions on visible extremities Extremities: No edema  Neurological: Alert oriented x 4, grossly non-focal Psychological:  Alert and cooperative. Normal mood and affect  ASSESSMENT AND PLAN: *GERD, nausea, belching: This is all new over the past about 6 or 7 weeks.  She has never had an endoscopy performed.  Has been on pantoprazole 40 mg daily for about 10 years or so.  Is diabetic and on Farxiga, previously on Trulicity as well.  Last hemoglobin A1c 7.2.  We will start with EGD with Dr. [name].  Pending results question if she needs a gastric emptying scan. *CRC screening:  Last colonoscopy 2013 with Dr. Michail Sermon.  We will request those reports for our record.  Will schedule for colonoscopy with Dr. [name] as well.  She mentions some looser bowel movements over the past several weeks as well.  Suggested Benefiber beginning with 2 teaspoons mixed in 8 ounces of liquid daily increasing to twice daily if needed.  **The risks, benefits, and alternatives to EGD and colonoscopy were discussed with the patient and she consents to proceed.   **Addendum: Colonoscopy records received from [facility].  Colonoscopy performed 07/15/2011 by Dr. Michail Sermon showed two 1 to 2 mm polyps removed from the sigmoid colon and internal hemorrhoids.  Biopsy showed hyperplastic polyps with associated benign lymphoid aggregates and no dysplasia or malignancy identified.  10 year recall recommended.  Records being sent for scanning.  CC:  Janith Lima, MD

## 2021-11-05 ENCOUNTER — Telehealth: Payer: Self-pay | Admitting: Internal Medicine

## 2021-11-05 DIAGNOSIS — R194 Change in bowel habit: Secondary | ICD-10-CM | POA: Insufficient documentation

## 2021-11-05 DIAGNOSIS — R142 Eructation: Secondary | ICD-10-CM | POA: Insufficient documentation

## 2021-11-05 DIAGNOSIS — F4323 Adjustment disorder with mixed anxiety and depressed mood: Secondary | ICD-10-CM | POA: Diagnosis not present

## 2021-11-05 DIAGNOSIS — R110 Nausea: Secondary | ICD-10-CM | POA: Insufficient documentation

## 2021-11-05 NOTE — Progress Notes (Signed)
I agree with the assessment and plan as outlined by [name]. 

## 2021-11-05 NOTE — Telephone Encounter (Signed)
Patient has had no changes in her stomach issues.  Patient will have a colonoscopy and endoscopy next Monday - she was just letting you know.

## 2021-11-07 DIAGNOSIS — Z713 Dietary counseling and surveillance: Secondary | ICD-10-CM | POA: Diagnosis not present

## 2021-11-11 ENCOUNTER — Ambulatory Visit (AMBULATORY_SURGERY_CENTER): Payer: BC Managed Care – PPO | Admitting: Internal Medicine

## 2021-11-11 ENCOUNTER — Encounter: Payer: Self-pay | Admitting: Internal Medicine

## 2021-11-11 VITALS — BP 135/69 | HR 62 | Temp 98.4°F | Resp 12 | Ht 66.0 in | Wt 179.0 lb

## 2021-11-11 DIAGNOSIS — K3189 Other diseases of stomach and duodenum: Secondary | ICD-10-CM | POA: Diagnosis not present

## 2021-11-11 DIAGNOSIS — R109 Unspecified abdominal pain: Secondary | ICD-10-CM

## 2021-11-11 DIAGNOSIS — R142 Eructation: Secondary | ICD-10-CM

## 2021-11-11 DIAGNOSIS — Z1211 Encounter for screening for malignant neoplasm of colon: Secondary | ICD-10-CM | POA: Diagnosis not present

## 2021-11-11 DIAGNOSIS — D122 Benign neoplasm of ascending colon: Secondary | ICD-10-CM

## 2021-11-11 DIAGNOSIS — D123 Benign neoplasm of transverse colon: Secondary | ICD-10-CM

## 2021-11-11 DIAGNOSIS — R194 Change in bowel habit: Secondary | ICD-10-CM

## 2021-11-11 DIAGNOSIS — R112 Nausea with vomiting, unspecified: Secondary | ICD-10-CM

## 2021-11-11 DIAGNOSIS — K219 Gastro-esophageal reflux disease without esophagitis: Secondary | ICD-10-CM

## 2021-11-11 DIAGNOSIS — R110 Nausea: Secondary | ICD-10-CM

## 2021-11-11 DIAGNOSIS — K2950 Unspecified chronic gastritis without bleeding: Secondary | ICD-10-CM | POA: Diagnosis not present

## 2021-11-11 DIAGNOSIS — K317 Polyp of stomach and duodenum: Secondary | ICD-10-CM | POA: Diagnosis not present

## 2021-11-11 DIAGNOSIS — D132 Benign neoplasm of duodenum: Secondary | ICD-10-CM | POA: Diagnosis not present

## 2021-11-11 HISTORY — PX: COLONOSCOPY WITH ESOPHAGOGASTRODUODENOSCOPY (EGD): SHX5779

## 2021-11-11 MED ORDER — SODIUM CHLORIDE 0.9 % IV SOLN: 500.0000 mL | Freq: Once | INTRAVENOUS | Status: DC

## 2021-11-11 NOTE — Progress Notes (Signed)
Pt's states no medical or surgical changes since previsit or office visit. 

## 2021-11-11 NOTE — Op Note (Addendum)
[facility] Patient Name: [name] Procedure Date: 11/11/2021 2:00 PM MRN: [id number] Endoscopist: Sonny Masters "[name] ,  Age: 61 Referring MD:  Date of Birth: 30-Jan-1960 Gender: Female Account #: [id number] Procedure:                Colonoscopy Indications:              Screening for colorectal malignant neoplasm Medicines:                Monitored Anesthesia Care Procedure:                Pre-Anesthesia Assessment:                           - Prior to the procedure, a History and Physical                            was performed, and patient medications and                            allergies were reviewed. The patient's tolerance of                            previous anesthesia was also reviewed. The risks                            and benefits of the procedure and the sedation                            options and risks were discussed with the patient.                            All questions were answered, and informed consent                            was obtained. Prior Anticoagulants: The patient has                            taken no previous anticoagulant or antiplatelet                            agents. ASA Grade Assessment: II - A patient with                            mild systemic disease. After reviewing the risks                            and benefits, the patient was deemed in                            satisfactory condition to undergo the procedure.                           After obtaining informed consent, the colonoscope  was passed under direct vision. Throughout the                            procedure, the patient's blood pressure, pulse, and                            oxygen saturations were monitored continuously. The                            CF [id number] #[id number] was introduced through the anus                            and advanced to the the terminal ileum. The                            colonoscopy was  performed without difficulty. The                            patient tolerated the procedure well. The quality                            of the bowel preparation was good. The terminal                            ileum, ileocecal valve, appendiceal orifice, and                            rectum were photographed. Scope In: 2:29:32 PM Scope Out: 2:59:10 PM Scope Withdrawal Time: 0 hours 18 minutes 37 seconds  Total Procedure Duration: 0 hours 29 minutes 38 seconds  Findings:                 The terminal ileum appeared normal.                           Two sessile polyps were found in the transverse                            colon and ascending colon. The polyps were 3 to 4                            mm in size. These polyps were removed with a cold                            snare. Resection and retrieval were complete.                           Non-bleeding internal hemorrhoids were found during                            retroflexion. Complications:            No immediate complications. Estimated Blood Loss:     Estimated blood loss was minimal. Impression:               -  The examined portion of the ileum was normal.                           - Two 3 to 4 mm polyps in the transverse colon and                            in the ascending colon, removed with a cold snare.                            Resected and retrieved.                           - Non-bleeding internal hemorrhoids. Recommendation:           - Discharge patient to home (with escort).                           - Await pathology results.                           - Ordered gastric emptying study.                           - Can consider gastroparesis diet with small meals,                            low fiber, and low fat.                           - Return to GI clinic in 4 weeks.                           - The findings and recommendations were discussed                            with the patient. Dr Georgian Co "[name]" [name],  11/11/2021 3:09:50 PM

## 2021-11-11 NOTE — Op Note (Signed)
[name] Patient Name: [name] Procedure Date: 11/11/2021 2:12 PM MRN: [id number] Endoscopist: Sonny Masters "[name] ,  Age: 61 Referring MD:  Date of Birth: [birth year] Gender: Female Account #: [id number] Procedure:                Upper GI endoscopy Indications:              Heartburn, Nausea Medicines:                Monitored Anesthesia Care Procedure:                Pre-Anesthesia Assessment:                           - Prior to the procedure, a History and Physical                            was performed, and patient medications and                            allergies were reviewed. The patient's tolerance of                            previous anesthesia was also reviewed. The risks                            and benefits of the procedure and the sedation                            options and risks were discussed with the patient.                            All questions were answered, and informed consent                            was obtained. Prior Anticoagulants: The patient has                            taken no previous anticoagulant or antiplatelet                            agents. ASA Grade Assessment: II - A patient with                            mild systemic disease. After reviewing the risks                            and benefits, the patient was deemed in                            satisfactory condition to undergo the procedure.                           After obtaining informed consent, the endoscope was  passed under direct vision. Throughout the                            procedure, the patient's blood pressure, pulse, and                            oxygen saturations were monitored continuously. The                            GIF [id number] was introduced through the                            mouth, and advanced to the second part of duodenum.                            The upper GI endoscopy was  accomplished without                            difficulty. The patient tolerated the procedure                            well. Scope In: Scope Out: Findings:                 The examined esophagus was normal.                           Four 3 to 7 mm sessile polyps with no bleeding and                            no stigmata of recent bleeding were found in the                            gastric body. These polyps were removed with a cold                            snare. Resection and retrieval were complete.                           Localized mildly erythematous mucosa without                            bleeding was found in the gastric antrum. Biopsies                            were taken with a cold forceps for histology.                           Two 2 to 3 mm sessile polyps with no bleeding were                            found in the duodenal bulb. These polyps were  removed with a cold biopsy forceps. Resection and                            retrieval were complete. Complications:            No immediate complications. Estimated Blood Loss:     Estimated blood loss was minimal. Impression:               - Normal esophagus.                           - Four gastric polyps. Resected and retrieved.                           - Erythematous mucosa in the antrum. Biopsied.                           - Two duodenal polyps. Resected and retrieved. Recommendation:           - Await pathology results.                           - Perform a colonoscopy today. Dr Georgian Co "[name]" [name],  11/11/2021 3:07:40 PM

## 2021-11-11 NOTE — Progress Notes (Signed)
GASTROENTEROLOGY PROCEDURE H&P NOTE   Primary Care Physician: Janith Lima, MD    Reason for Procedure:   Colon cancer screening, GERD, nausea, belching  Plan:    EGD/colonoscopy  Patient is appropriate for endoscopic procedure(s) in the ambulatory ([place]) setting.  The nature of the procedure, as well as the risks, benefits, and alternatives were carefully and thoroughly reviewed with the patient. Ample time for discussion and questions allowed. The patient understood, was satisfied, and agreed to proceed.     HPI: [name] is a 61 y.o. female who presents for EGD/colonoscopy for evaluation of GERD, nausea, belching, and colon cancer screening .  Patient was most recently seen in the Gastroenterology Clinic on 11/04/21.  No interval change in medical history since that appointment. Please refer to that note for full details regarding GI history and clinical presentation.   Past Medical History:  Diagnosis Date   Depression    Diabetes mellitus without complication ([place])    GERD (gastroesophageal reflux disease)    Hypertension    Sleep apnea     Past Surgical History:  Procedure Laterality Date   DIAGNOSTIC LAPAROSCOPY     DILATION AND CURETTAGE OF UTERUS N/A 05/09/2020   Procedure: CERVICAL DILATATION AND ENDOCERVICAL CURETTAGE;  Surgeon: Drema Dallas, DO;  Location: [facility];  Service: Gynecology;  Laterality: N/A;   LABIOPLASTY Left 08/10/2017   Procedure: LABIAPLASTY REVISION;  Surgeon: Thurnell Lose, MD;  Location: [facility];  Service: Gynecology;  Laterality: Left;  Labiaplasty revision    Prior to Admission medications   Medication Sig Start Date End Date Taking? Authorizing Provider  atorvastatin (LIPITOR) 10 MG tablet TAKE 1 TABLET BY MOUTH EVERY DAY 07/05/21   Janith Lima, MD  buPROPion (WELLBUTRIN XL) 300 MG 24 hr tablet Take 300 mg by mouth daily. 12/27/13   [provider]  citalopram (CELEXA) 40 MG  tablet TAKE 1 TABLET BY MOUTH EVERY DAY 07/04/21   Janith Lima, MD  Continuous Blood Gluc Receiver ([place]) [place] 1 Act by Does not apply route daily. 09/21/21   Janith Lima, MD  Continuous Blood Gluc Sensor (DEXCOM G7 SENSOR) MISC 1 Act by Does not apply route daily. 09/21/21   Janith Lima, MD  Cyanocobalamin (VITAMIN B12) 1000 MCG TBCR Take by mouth.    [provider]  FARXIGA 10 MG TABS tablet TAKE 1 TABLET BY MOUTH EVERY DAY BEFORE BREAKFAST 06/04/21   Janith Lima, MD  glucosamine-chondroitin 500-400 MG tablet Take by mouth 3 (three) times daily.    [provider]  pantoprazole (PROTONIX) 40 MG tablet Take 1 tablet (40 mg total) by mouth daily. 04/15/21   Janith Lima, MD  ramipril (ALTACE) 5 MG capsule Take 1 capsule (5 mg total) by mouth daily. 08/28/21   Janith Lima, MD  VITAMIN D PO Take 5,000 Units by mouth.    [provider]    Current Outpatient Medications  Medication Sig Dispense Refill   atorvastatin (LIPITOR) 10 MG tablet TAKE 1 TABLET BY MOUTH EVERY DAY 90 tablet 1   buPROPion (WELLBUTRIN XL) 300 MG 24 hr tablet Take 300 mg by mouth daily.  1   citalopram (CELEXA) 40 MG tablet TAKE 1 TABLET BY MOUTH EVERY DAY 90 tablet 1   Continuous Blood Gluc Receiver (DEXCOM G7 RECEIVER) [place] 1 Act by Does not apply route daily. 9 each 1   Continuous Blood Gluc Sensor (DEXCOM G7 SENSOR)  MISC 1 Act by Does not apply route daily. 9 each 1   Cyanocobalamin (VITAMIN B12) 1000 MCG TBCR Take by mouth.     FARXIGA 10 MG TABS tablet TAKE 1 TABLET BY MOUTH EVERY DAY BEFORE BREAKFAST 90 tablet 1   glucosamine-chondroitin 500-400 MG tablet Take by mouth 3 (three) times daily.     pantoprazole (PROTONIX) 40 MG tablet Take 1 tablet (40 mg total) by mouth daily. 90 tablet 1   ramipril (ALTACE) 5 MG capsule Take 1 capsule (5 mg total) by mouth daily. 90 capsule 1   VITAMIN D PO Take 5,000 Units by mouth.     Current Facility-Administered Medications   Medication Dose Route Frequency Provider Last Rate Last Admin   0.9 %  sodium chloride infusion  500 mL Intravenous Once Sharyn Creamer, MD        Allergies as of 11/11/2021 - Review Complete 11/11/2021  Allergen Reaction Noted   Flagyl [metronidazole] Diarrhea 08/06/2017   Metformin and related Shortness Of Breath 06/11/2020    Family History  Problem Relation Age of Onset   Dementia Mother    Alzheimer's disease Mother    Hypertension Mother    Rheumatic fever Father    Bipolar disorder Sister    Diabetes Sister    High Cholesterol Sister    Bipolar disorder Brother    Depression Maternal Grandmother    Diabetes Maternal Grandfather    Pancreatic cancer Paternal Grandfather     Social History   Socioeconomic History   Marital status: Married    Spouse name: Girard Cooter "Ronalee Belts"   Number of children: 0   Years of education: Not on file   Highest education level: Not on file  Occupational History   Occupation: accountent  Tobacco Use   Smoking status: Never   Smokeless tobacco: Never  Vaping Use   Vaping Use: Never used  Substance and Sexual Activity   Alcohol use: Yes    Comment: rarely about 2 drinks per month   Drug use: No   Sexual activity: Not on file  Other Topics Concern   Not on file  Social History Narrative   Right handed    Lives in a one story home    Social Determinants of Health   Financial Resource Strain: Not on file  Food Insecurity: Not on file  Transportation Needs: Not on file  Physical Activity: Not on file  Stress: Not on file  Social Connections: Not on file  Intimate Partner Violence: Not on file    Physical Exam: Vital signs in last 24 hours: BP 112/68   Pulse 74   Temp 98.4 F (36.9 C) (Temporal)   Ht '5\' 6"'$  (1.676 m)   Wt 179 lb (81.2 kg)   LMP 02/02/2013   SpO2 98%   BMI 28.89 kg/m  GEN: NAD EYE: Sclerae anicteric ENT: MMM CV: Non-tachycardic Pulm: No increased WOB GI: Soft NEURO:  Alert & Oriented   [name], MD [facility]   11/11/2021 1:40 PM

## 2021-11-11 NOTE — Progress Notes (Signed)
Report to pacu rn. Vss. Care resumed by rn. 

## 2021-11-11 NOTE — Patient Instructions (Signed)
Discharge instructions given. Handouts on polyps and hemorrhoids. Biopsies taken. Resume previous medications. Office will call to schedule follow up appointment for 4 weeks. YOU HAD AN ENDOSCOPIC PROCEDURE TODAY AT [facility]:   Refer to the procedure report that was given to you for any specific questions about what was found during the examination.  If the procedure report does not answer your questions, please call your gastroenterologist to clarify.  If you requested that your care partner not be given the details of your procedure findings, then the procedure report has been included in a sealed envelope for you to review at your convenience later.  YOU SHOULD EXPECT: Some feelings of bloating in the abdomen. Passage of more gas than usual.  Walking can help get rid of the air that was put into your GI tract during the procedure and reduce the bloating. If you had a lower endoscopy (such as a colonoscopy or flexible sigmoidoscopy) you may notice spotting of blood in your stool or on the toilet paper. If you underwent a bowel prep for your procedure, you may not have a normal bowel movement for a few days.  Please Note:  You might notice some irritation and congestion in your nose or some drainage.  This is from the oxygen used during your procedure.  There is no need for concern and it should clear up in a day or so.  SYMPTOMS TO REPORT IMMEDIATELY:  Following lower endoscopy (colonoscopy or flexible sigmoidoscopy):  Excessive amounts of blood in the stool  Significant tenderness or worsening of abdominal pains  Swelling of the abdomen that is new, acute  Fever of 100F or higher  Following upper endoscopy (EGD)  Vomiting of blood or coffee ground material  New chest pain or pain under the shoulder blades  Painful or persistently difficult swallowing  New shortness of breath  Fever of 100F or higher  Black, tarry-looking stools  For urgent or emergent issues, a  gastroenterologist can be reached at any hour by calling [phone]. Do not use MyChart messaging for urgent concerns.    DIET:  We do recommend a small meal at first, but then you may proceed to your regular diet.  Drink plenty of fluids but you should avoid alcoholic beverages for 24 hours.  ACTIVITY:  You should plan to take it easy for the rest of today and you should NOT DRIVE or use heavy machinery until tomorrow (because of the sedation medicines used during the test).    FOLLOW UP: Our staff will call the number listed on your records the next business day following your procedure.  We will call around 7:15- 8:00 am to check on you and address any questions or concerns that you may have regarding the information given to you following your procedure. If we do not reach you, we will leave a message.     If any biopsies were taken you will be contacted by phone or by letter within the next 1-3 weeks.  Please call us at [phone] if you have not heard about the biopsies in 3 weeks.    SIGNATURES/CONFIDENTIALITY: You and/or your care partner have signed paperwork which will be entered into your electronic medical record.  These signatures attest to the fact that that the information above on your After Visit Summary has been reviewed and is understood.  Full responsibility of the confidentiality of this discharge information lies with you and/or your care-partner.

## 2021-11-12 ENCOUNTER — Telehealth: Payer: Self-pay

## 2021-11-12 NOTE — Telephone Encounter (Signed)
  Follow up Call-     11/11/2021    1:39 PM  Call back number  Post procedure Call Back phone  # [phone]  Permission to leave phone message Yes     Patient questions:  Do you have a fever, pain , or abdominal swelling? No. Pain Score  0 *  Have you tolerated food without any problems? Yes.    Have you been able to return to your normal activities? Yes.    Do you have any questions about your discharge instructions: Diet   No. Medications  No. Follow up visit  No.  Do you have questions or concerns about your Care? No.  Actions: * If pain score is 4 or above: No action needed, pain <4.

## 2021-11-14 ENCOUNTER — Other Ambulatory Visit: Payer: Self-pay | Admitting: Internal Medicine

## 2021-11-14 ENCOUNTER — Encounter: Payer: Self-pay | Admitting: Internal Medicine

## 2021-11-14 ENCOUNTER — Other Ambulatory Visit (HOSPITAL_COMMUNITY): Payer: Self-pay

## 2021-11-14 DIAGNOSIS — E118 Type 2 diabetes mellitus with unspecified complications: Secondary | ICD-10-CM

## 2021-11-14 DIAGNOSIS — E1142 Type 2 diabetes mellitus with diabetic polyneuropathy: Secondary | ICD-10-CM

## 2021-11-18 DIAGNOSIS — L820 Inflamed seborrheic keratosis: Secondary | ICD-10-CM | POA: Diagnosis not present

## 2021-11-18 DIAGNOSIS — L814 Other melanin hyperpigmentation: Secondary | ICD-10-CM | POA: Diagnosis not present

## 2021-11-18 DIAGNOSIS — L538 Other specified erythematous conditions: Secondary | ICD-10-CM | POA: Diagnosis not present

## 2021-11-18 DIAGNOSIS — R208 Other disturbances of skin sensation: Secondary | ICD-10-CM | POA: Diagnosis not present

## 2021-11-18 DIAGNOSIS — D1801 Hemangioma of skin and subcutaneous tissue: Secondary | ICD-10-CM | POA: Diagnosis not present

## 2021-11-18 DIAGNOSIS — L298 Other pruritus: Secondary | ICD-10-CM | POA: Diagnosis not present

## 2021-11-18 DIAGNOSIS — L821 Other seborrheic keratosis: Secondary | ICD-10-CM | POA: Diagnosis not present

## 2021-11-21 DIAGNOSIS — Z713 Dietary counseling and surveillance: Secondary | ICD-10-CM | POA: Diagnosis not present

## 2021-11-25 ENCOUNTER — Other Ambulatory Visit: Payer: Self-pay | Admitting: Internal Medicine

## 2021-11-25 ENCOUNTER — Encounter: Payer: Self-pay | Admitting: Internal Medicine

## 2021-11-25 ENCOUNTER — Encounter (INDEPENDENT_AMBULATORY_CARE_PROVIDER_SITE_OTHER): Payer: BC Managed Care – PPO | Admitting: Internal Medicine

## 2021-11-25 DIAGNOSIS — E119 Type 2 diabetes mellitus without complications: Secondary | ICD-10-CM | POA: Insufficient documentation

## 2021-11-25 DIAGNOSIS — E118 Type 2 diabetes mellitus with unspecified complications: Secondary | ICD-10-CM

## 2021-11-25 MED ORDER — INSULIN PEN NEEDLE 32G X 6 MM MISC
1.0000 | Freq: Every day | 0 refills | Status: DC
  Filled 2021-12-30: qty 100, 90d supply, fill #0

## 2021-11-25 MED ORDER — TRESIBA FLEXTOUCH 100 UNIT/ML ~~LOC~~ SOPN
10.0000 [IU] | PEN_INJECTOR | Freq: Every day | SUBCUTANEOUS | 1 refills | Status: DC
  Filled 2021-12-30: qty 3, 30d supply, fill #0
  Filled 2022-01-07: qty 9, 90d supply, fill #0
  Filled 2022-01-29: qty 3, 30d supply, fill #0
  Filled 2022-02-17: qty 9, 90d supply, fill #0

## 2021-11-26 ENCOUNTER — Ambulatory Visit: Payer: Self-pay

## 2021-11-26 DIAGNOSIS — E119 Type 2 diabetes mellitus without complications: Secondary | ICD-10-CM

## 2021-11-26 DIAGNOSIS — F4323 Adjustment disorder with mixed anxiety and depressed mood: Secondary | ICD-10-CM | POA: Diagnosis not present

## 2021-11-26 DIAGNOSIS — Z794 Long term (current) use of insulin: Secondary | ICD-10-CM

## 2021-11-26 NOTE — Telephone Encounter (Signed)
Please see the MyChart message reply(ies) for my assessment and plan.  The patient gave consent for this Medical Advice Message and is aware that it may result in a bill to their insurance company as well as the possibility that this may result in a co-payment or deductible. They are an established patient, but are not seeking medical advice exclusively about a problem treated during an in person or video visit in the last 7 days. I did not recommend an in person or video visit within 7 days of my reply.  I spent a total of 7 minutes cumulative time within 7 days through MyChart messaging Roneisha Stern, MD  

## 2021-11-28 ENCOUNTER — Telehealth: Payer: Self-pay | Admitting: Neurology

## 2021-11-28 NOTE — Telephone Encounter (Signed)
Returned call and scheduled patient for 12/27/21 at 8:30 AM.   She said her foot had returned to pretty much normal at this point. Before it was like she was standing on it but could not feel her foot she further explained.  Patient will call back with any other concerns.

## 2021-11-28 NOTE — Telephone Encounter (Signed)
Need patient to clarify what "very strange" means.  It's probably best to offer a follow-up visit to be evaluated.

## 2021-11-28 NOTE — Telephone Encounter (Signed)
Patient called and said her left foot has been feeling very strange since 2:00 PM today.  She was sitting for about 15 minutes before it happened. She has peripheral neuropathy and is concerned.

## 2021-12-03 DIAGNOSIS — Z713 Dietary counseling and surveillance: Secondary | ICD-10-CM | POA: Diagnosis not present

## 2021-12-06 DIAGNOSIS — Z713 Dietary counseling and surveillance: Secondary | ICD-10-CM | POA: Diagnosis not present

## 2021-12-16 ENCOUNTER — Encounter (HOSPITAL_COMMUNITY)
Admission: RE | Admit: 2021-12-16 | Discharge: 2021-12-16 | Disposition: A | Discharge status: Home / Self Care | Payer: BC Managed Care – PPO | Source: Ambulatory Visit | Attending: Internal Medicine | Admitting: Internal Medicine

## 2021-12-16 DIAGNOSIS — R109 Unspecified abdominal pain: Secondary | ICD-10-CM | POA: Diagnosis not present

## 2021-12-16 DIAGNOSIS — R112 Nausea with vomiting, unspecified: Secondary | ICD-10-CM | POA: Insufficient documentation

## 2021-12-16 DIAGNOSIS — R110 Nausea: Secondary | ICD-10-CM | POA: Insufficient documentation

## 2021-12-16 DIAGNOSIS — R6881 Early satiety: Secondary | ICD-10-CM | POA: Diagnosis not present

## 2021-12-16 MED ORDER — TECHNETIUM TC 99M SULFUR COLLOID
2.2000 | Freq: Once | INTRAVENOUS | Status: AC
  Administered 2021-12-16: 2.2 via INTRAVENOUS

## 2021-12-17 ENCOUNTER — Other Ambulatory Visit: Payer: Self-pay

## 2021-12-17 ENCOUNTER — Encounter: Payer: Self-pay | Admitting: Internal Medicine

## 2021-12-17 DIAGNOSIS — R140 Abdominal distension (gaseous): Secondary | ICD-10-CM

## 2021-12-17 DIAGNOSIS — F4323 Adjustment disorder with mixed anxiety and depressed mood: Secondary | ICD-10-CM | POA: Diagnosis not present

## 2021-12-17 DIAGNOSIS — R110 Nausea: Secondary | ICD-10-CM

## 2021-12-20 DIAGNOSIS — Z713 Dietary counseling and surveillance: Secondary | ICD-10-CM | POA: Diagnosis not present

## 2021-12-22 DIAGNOSIS — R140 Abdominal distension (gaseous): Secondary | ICD-10-CM | POA: Diagnosis not present

## 2021-12-22 DIAGNOSIS — R110 Nausea: Secondary | ICD-10-CM | POA: Diagnosis not present

## 2021-12-23 ENCOUNTER — Telehealth: Payer: Self-pay | Admitting: Internal Medicine

## 2021-12-23 NOTE — Telephone Encounter (Signed)
Patient called states she did the SIBO test and yesterday she noticed her blood sugar were extremely low seeking advise.

## 2021-12-24 NOTE — Telephone Encounter (Signed)
Patient had increases in her blood sugar. She attributes this to the diet she had to follow in order to do the SIBO breath test. She reports her glucose as stabilized as of today.

## 2021-12-26 NOTE — Progress Notes (Signed)
Follow-up Visit   Date: 12/27/2021    [name] MRN: [id number] DOB: 02-13-1960    [name] is a 61 y.o. right-handed Caucasian female with diabetes mellitus, hyperlipidemia, depression, and hypertension returning to the clinic for follow-up of left foot weakness.  The patient was accompanied to the clinic by self.  IMPRESSION/PLAN: Left foot drop, resolved.  Most likely due to transient compression of the peroneal nerve.  Lack of low back or radicular pain makes L5 radiculopathy unlikely.  Fortunately, symptoms quickly improved within 3 hours.  She did not have left face or arm involvement, so doubt TIA.  Recommend that she avoid crossing her legs and monitor  Diabetic polyneuropathy manifesting with burning paresthesias and distal numbness, stable.  Return to clinic in 6 months  --------------------------------------------- UPDATE 12/27/2021:  About a month ago, she had one spell of having weakness in her left foot where she was unable to raise her left foot at the ankle or extend her toes. Symptoms lasted 3 hours.  Prior to this, she was sitting eating lunch for about 15 minutes and noticed symptoms when she returned to work and was walking in the parking lot.  She recalls having to raise the left leg to clear the ground.  She had associated numbness of the foot.  There was no weakness/numbness involving the face or arm.  No associated back pain or shooting.   This has not recurred.  She does cross her legs, but does not remember if she was sitting in this position.  She also complains of burning and tingling in the forearm.    Medications:  Current Outpatient Medications on File Prior to Visit  Medication Sig Dispense Refill   atorvastatin (LIPITOR) 10 MG tablet TAKE 1 TABLET BY MOUTH EVERY DAY 90 tablet 1   buPROPion (WELLBUTRIN XL) 300 MG 24 hr tablet Take 300 mg by mouth daily.  1   citalopram (CELEXA) 40 MG tablet TAKE 1 TABLET BY MOUTH EVERY DAY 90 tablet 1    Continuous Blood Gluc Receiver (DEXCOM G7 RECEIVER) [place] 1 Act by Does not apply route daily. 9 each 1   Continuous Blood Gluc Sensor (DEXCOM G7 SENSOR) MISC 1 Act by Does not apply route daily. 9 each 1   Cyanocobalamin (VITAMIN B12) 1000 MCG TBCR Take by mouth.     FARXIGA 10 MG TABS tablet TAKE 1 TABLET BY MOUTH EVERY DAY BEFORE BREAKFAST 90 tablet 1   insulin degludec (TRESIBA FLEXTOUCH) 100 UNIT/ML FlexTouch Pen Inject 10 Units into the skin daily. (Patient taking differently: Inject 24 Units into the skin daily.) 9 mL 1   Insulin Pen Needle 32G X 6 MM MISC 1 Act by Does not apply route daily. 100 each 1   loratadine (CLARITIN) 10 MG tablet      pantoprazole (PROTONIX) 40 MG tablet Take 1 tablet (40 mg total) by mouth daily. 90 tablet 1   ramipril (ALTACE) 5 MG capsule Take 1 capsule (5 mg total) by mouth daily. 90 capsule 1   VITAMIN D PO Take 5,000 Units by mouth.     glucosamine-chondroitin 500-400 MG tablet Take by mouth 3 (three) times daily.     No current facility-administered medications on file prior to visit.    Allergies:  Allergies  Allergen Reactions   Flagyl [Metronidazole] Diarrhea   Metformin And Related Shortness Of Breath    Vital Signs:  BP 118/75   Pulse 84   Ht '5\' 6"'$  (1.676 m)  Wt 176 lb (79.8 kg)   LMP 02/02/2013   SpO2 98%   BMI 28.41 kg/m   Neurological Exam: MENTAL STATUS including orientation to time, place, person, recent and remote memory, attention span and concentration, language, and fund of knowledge is normal.  Speech is not dysarthric.  CRANIAL NERVES:  Pupils equal round and reactive to light.  Normal conjugate, extra-ocular eye movements in all directions of gaze.  No ptosis.  Face is symmetric. Palate elevates symmetrically.  Tongue is midline.  MOTOR:  Motor strength is 5/5 in all extremities, including left foot dorsiflexion and toe extension.  No atrophy, fasciculations or abnormal movements.  No pronator drift.  Tone is normal.     MSRs:  Reflexes are 2+/4 throughout, except absent below the ankles.  SENSORY:  Vibration absent at the great toe, temperature reduced over the dorsum of the feet.  COORDINATION/GAIT:  Normal finger-to- nose-finger.  Intact rapid alternating movements bilaterally.  Gait narrow based and stable.  Stressed and tandem gait intact  Data: n/a    Thank you for allowing me to participate in patient's care.  If I can answer any additional questions, I would be pleased to do so.    Sincerely,    Kyle Luppino K. Posey Pronto, DO

## 2021-12-27 ENCOUNTER — Encounter: Payer: Self-pay | Admitting: Neurology

## 2021-12-27 ENCOUNTER — Ambulatory Visit (INDEPENDENT_AMBULATORY_CARE_PROVIDER_SITE_OTHER): Payer: BC Managed Care – PPO | Admitting: Neurology

## 2021-12-27 VITALS — BP 118/75 | HR 84 | Ht 66.0 in | Wt 176.0 lb

## 2021-12-27 DIAGNOSIS — M21372 Foot drop, left foot: Secondary | ICD-10-CM

## 2021-12-27 DIAGNOSIS — Z713 Dietary counseling and surveillance: Secondary | ICD-10-CM | POA: Diagnosis not present

## 2021-12-27 NOTE — Patient Instructions (Addendum)
Avoid crossing your legs  Return to clinic in 6 months

## 2021-12-28 ENCOUNTER — Encounter: Payer: Self-pay | Admitting: Internal Medicine

## 2021-12-28 ENCOUNTER — Other Ambulatory Visit (HOSPITAL_COMMUNITY): Payer: Self-pay

## 2021-12-29 ENCOUNTER — Other Ambulatory Visit: Payer: Self-pay | Admitting: Internal Medicine

## 2021-12-30 ENCOUNTER — Other Ambulatory Visit: Payer: Self-pay | Admitting: Internal Medicine

## 2021-12-30 ENCOUNTER — Encounter: Payer: Self-pay | Admitting: Internal Medicine

## 2021-12-30 ENCOUNTER — Other Ambulatory Visit (HOSPITAL_COMMUNITY): Payer: Self-pay

## 2021-12-30 DIAGNOSIS — F325 Major depressive disorder, single episode, in full remission: Secondary | ICD-10-CM

## 2021-12-30 MED ORDER — BUPROPION HCL ER (XL) 300 MG PO TB24
300.0000 mg | ORAL_TABLET | Freq: Every day | ORAL | 1 refills | Status: DC
  Filled 2021-12-30: qty 90, 90d supply, fill #0
  Filled 2022-02-17 – 2022-04-02 (×2): qty 90, 90d supply, fill #1

## 2021-12-30 MED ORDER — DEXCOM G7 SENSOR MISC
3 refills | Status: DC
  Filled 2021-12-30: qty 3, 30d supply, fill #0
  Filled 2022-01-29: qty 3, 30d supply, fill #1
  Filled 2022-02-17 – 2022-02-21 (×3): qty 3, 30d supply, fill #2
  Filled 2022-03-21 – 2022-03-24 (×2): qty 1, 10d supply, fill #3
  Filled 2022-04-21: qty 2, 20d supply, fill #4

## 2021-12-30 MED ORDER — DEXCOM G6 SENSOR MISC
3 refills | Status: DC
  Filled 2021-12-30 – 2022-02-18 (×2): qty 3, 30d supply, fill #0

## 2021-12-30 MED ORDER — TRULICITY 3 MG/0.5ML ~~LOC~~ SOAJ
3.0000 mg | SUBCUTANEOUS | 2 refills | Status: DC
  Filled 2021-12-30: qty 2, 28d supply, fill #0

## 2021-12-30 MED ORDER — DEXCOM G7 RECEIVER DEVI
1 refills | Status: AC
  Filled 2021-12-30 – 2022-02-17 (×2): qty 1, 90d supply, fill #0
  Filled 2022-02-18: qty 1, 30d supply, fill #0
  Filled 2022-05-14: qty 1, 90d supply, fill #0

## 2021-12-30 MED FILL — Citalopram Hydrobromide Tab 40 MG (Base Equiv): ORAL | 90 days supply | Qty: 90 | Fill #0 | Status: CN

## 2021-12-30 MED FILL — Dapagliflozin Propanediol Tab 10 MG (Base Equivalent): ORAL | 90 days supply | Qty: 90 | Fill #0 | Status: CN

## 2021-12-30 NOTE — Progress Notes (Signed)
Received records from patient's SIBO breath test. Will have report scanned into the patient's records.  SIBO breath test 12/22/21: Patient was found to have an increase in hydrogen of 25 ppm (high, nml <20 ppm), increase in methane 3 ppm (nml<12 ppm), and increase in hydrogen and methane of 28 ppm (high, nml <15 ppm). Thus patient's test is diagnostic of small intestinal bacterial overgrowth (SIBO).  Beth, please call the patient and let her know that her SIBO breath test was positive. We will treat her with PO rifaximin 550 mg TID for 14 days. Patient has had some looser stools so please place the diagnosis as IBS-D.

## 2021-12-31 ENCOUNTER — Other Ambulatory Visit: Payer: Self-pay

## 2021-12-31 ENCOUNTER — Encounter (HOSPITAL_COMMUNITY): Payer: Self-pay

## 2021-12-31 ENCOUNTER — Other Ambulatory Visit (HOSPITAL_COMMUNITY): Payer: Self-pay

## 2021-12-31 DIAGNOSIS — K580 Irritable bowel syndrome with diarrhea: Secondary | ICD-10-CM

## 2021-12-31 MED ORDER — RIFAXIMIN 550 MG PO TABS
550.0000 mg | ORAL_TABLET | Freq: Three times a day (TID) | ORAL | 0 refills | Status: AC
  Filled 2021-12-31: qty 42, 14d supply, fill #0

## 2021-12-31 NOTE — Progress Notes (Signed)
Patient is advised of the results and the plan of care. Pharmacy confirmed. Prescription routed to [facility].

## 2022-01-01 ENCOUNTER — Other Ambulatory Visit (HOSPITAL_COMMUNITY): Payer: Self-pay

## 2022-01-02 ENCOUNTER — Other Ambulatory Visit: Payer: Self-pay

## 2022-01-02 ENCOUNTER — Other Ambulatory Visit (HOSPITAL_COMMUNITY): Payer: Self-pay

## 2022-01-02 MED ORDER — PANTOPRAZOLE SODIUM 20 MG PO TBEC
20.0000 mg | DELAYED_RELEASE_TABLET | Freq: Every day | ORAL | 6 refills | Status: DC
  Filled 2022-01-02: qty 30, 30d supply, fill #0

## 2022-01-07 ENCOUNTER — Other Ambulatory Visit (HOSPITAL_COMMUNITY): Payer: Self-pay

## 2022-01-07 DIAGNOSIS — F4323 Adjustment disorder with mixed anxiety and depressed mood: Secondary | ICD-10-CM | POA: Diagnosis not present

## 2022-01-07 MED FILL — Dapagliflozin Propanediol Tab 10 MG (Base Equivalent): ORAL | 90 days supply | Qty: 90 | Fill #0 | Status: CN

## 2022-01-09 DIAGNOSIS — Z713 Dietary counseling and surveillance: Secondary | ICD-10-CM | POA: Diagnosis not present

## 2022-01-10 ENCOUNTER — Other Ambulatory Visit (HOSPITAL_COMMUNITY): Payer: Self-pay

## 2022-01-10 DIAGNOSIS — M25532 Pain in left wrist: Secondary | ICD-10-CM | POA: Diagnosis not present

## 2022-01-10 DIAGNOSIS — M25531 Pain in right wrist: Secondary | ICD-10-CM | POA: Diagnosis not present

## 2022-01-18 ENCOUNTER — Other Ambulatory Visit: Payer: Self-pay | Admitting: Internal Medicine

## 2022-01-27 ENCOUNTER — Other Ambulatory Visit (HOSPITAL_COMMUNITY): Payer: Self-pay

## 2022-01-27 ENCOUNTER — Encounter: Payer: Self-pay | Admitting: Internal Medicine

## 2022-01-27 ENCOUNTER — Other Ambulatory Visit (INDEPENDENT_AMBULATORY_CARE_PROVIDER_SITE_OTHER): Payer: BC Managed Care – PPO

## 2022-01-27 ENCOUNTER — Ambulatory Visit (INDEPENDENT_AMBULATORY_CARE_PROVIDER_SITE_OTHER): Payer: BC Managed Care – PPO | Admitting: Internal Medicine

## 2022-01-27 VITALS — BP 117/72 | HR 74 | Ht 66.0 in | Wt 172.0 lb

## 2022-01-27 DIAGNOSIS — K580 Irritable bowel syndrome with diarrhea: Secondary | ICD-10-CM

## 2022-01-27 DIAGNOSIS — E118 Type 2 diabetes mellitus with unspecified complications: Secondary | ICD-10-CM

## 2022-01-27 DIAGNOSIS — R109 Unspecified abdominal pain: Secondary | ICD-10-CM | POA: Diagnosis not present

## 2022-01-27 DIAGNOSIS — R110 Nausea: Secondary | ICD-10-CM

## 2022-01-27 DIAGNOSIS — K219 Gastro-esophageal reflux disease without esophagitis: Secondary | ICD-10-CM

## 2022-01-27 DIAGNOSIS — Z794 Long term (current) use of insulin: Secondary | ICD-10-CM

## 2022-01-27 DIAGNOSIS — E119 Type 2 diabetes mellitus without complications: Secondary | ICD-10-CM

## 2022-01-27 LAB — TSH: TSH: 2.47 u[IU]/mL (ref 0.35–5.50)

## 2022-01-27 LAB — IBC + FERRITIN
Ferritin: 108.6 ng/mL (ref 10.0–291.0)
Iron: 139 ug/dL (ref 42–145)
Saturation Ratios: 43.9 % (ref 20.0–50.0)
TIBC: 316.4 ug/dL (ref 250.0–450.0)
Transferrin: 226 mg/dL (ref 212.0–360.0)

## 2022-01-27 LAB — VITAMIN D 25 HYDROXY (VIT D DEFICIENCY, FRACTURES): VITD: 58.98 ng/mL (ref 30.00–100.00)

## 2022-01-27 MED ORDER — PANTOPRAZOLE SODIUM 40 MG PO TBEC
40.0000 mg | DELAYED_RELEASE_TABLET | Freq: Two times a day (BID) | ORAL | 3 refills | Status: DC
  Filled 2022-01-27: qty 60, 30d supply, fill #0

## 2022-01-27 NOTE — Patient Instructions (Addendum)
If you are age 63 or younger, your body mass index should be between 19-25. Your Body mass index is 27.76 kg/m. If this is out of the aformentioned range listed, please consider follow up with your Primary Care Provider.  ________________________________________________________  The [facility] providers would like to encourage you to use [facility] to communicate with providers for non-urgent requests or questions.  Due to long hold times on the telephone, sending your provider a message by [facility] may be a faster and more efficient way to get a response.  Please allow 48 business hours for a response.  Please remember that this is for non-urgent requests.  _______________________________________________________  Your provider has requested that you go to the basement level for lab work before leaving today. Press "B" on the elevator. The lab is located at the first door on the left as you exit the elevator.  We have sent the following medications to your pharmacy for you to pick up at your convenience:  INCREASE: pantoprazole to '40mg'$  one tablet twice daily  You are scheduled to follow up on 04-01-22 at 8:50am.  We have sent a referral to endocrinology for better management of diabetes and blood sugars.  Someone from their office should contact you with an appointment.    Due to recent changes in healthcare laws, you may see the results of your imaging and laboratory studies on MyChart before your provider has had a chance to review them.  We understand that in some cases there may be results that are confusing or concerning to you. Not all laboratory results come back in the same time frame and the provider may be waiting for multiple results in order to interpret others.  Please give [place] 48 hours in order for your provider to thoroughly review all the results before contacting the office for clarification of your results.   Thank you for entrusting me with your care and choosing [facility].  Dr [name]

## 2022-01-27 NOTE — Progress Notes (Signed)
01/27/2022 [name] [id number] 1960-01-30   HISTORY OF PRESENT ILLNESS: This is a 62 year old female presents for follow up of  abdominal pain, GERD, and nausea.    Interval History: She is about 40% improved in terms of her abdominal pain and bloating after completing antibiotics for SIBO. She did all 14 days of the rifaximin therapy. She is eating carefully because she has diabetes, and she has been trying to follow the low FODMAP diet. Her blood sugars are still fluctuating between highs and lows. Endorses some occasional regurgitation after decreasing from pantoprazole 40 mg QD to 20 mg QD. Endorses pain in the epigastric area, which mostly hurts in the morning. The pain can occur before eating and after eating. Endorses 1-2 BMs per day every morning. Endorses nausea. Denies vomiting. 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 She has been off of Trulicity for the last 3 months. Denies any rashes.  Wt Readings from Last 3 Encounters:  01/27/22 172 lb (78 kg)  12/27/21 176 lb (79.8 kg)  11/11/21 179 lb (81.2 kg)   Past Medical History:  Diagnosis Date   Depression    Diabetes mellitus without complication ([place])    GERD (gastroesophageal reflux disease)    Hypertension    Sleep apnea    Past Surgical History:  Procedure Laterality Date   COLONOSCOPY  2013   hypo plastic polyps at [facility] in [place] ESOPHAGOGASTRODUODENOSCOPY (EGD)  11/11/2021   DIAGNOSTIC LAPAROSCOPY     DILATION AND CURETTAGE OF UTERUS N/A 05/09/2020   Procedure: CERVICAL DILATATION AND ENDOCERVICAL CURETTAGE;  Surgeon: Drema Dallas, DO;  Location: [place];  Service: Gynecology;  Laterality: N/A;   LABIOPLASTY Left 08/10/2017   Procedure: LABIAPLASTY REVISION;  Surgeon: Thurnell Lose, MD;  Location: [place];  Service: Gynecology;  Laterality: Left;  Labiaplasty revision   UPPER GASTROINTESTINAL ENDOSCOPY      reports  that she has quit smoking. Her smoking use included cigarettes. She has never used smokeless tobacco. She reports current alcohol use. She reports that she does not use drugs. family history includes Alzheimer's disease in her mother; Bipolar disorder in her brother and sister; Dementia in her mother; Depression in her maternal grandmother; Diabetes in her maternal grandfather and sister; High Cholesterol in her sister; Hypertension in her mother; Pancreatic cancer in her paternal grandfather; Rheumatic fever in her father. Allergies  Allergen Reactions   Flagyl [Metronidazole] Diarrhea   Metformin And Related Shortness Of Breath     Outpatient Encounter Medications as of 01/27/2022  Medication Sig   atorvastatin (LIPITOR) 10 MG tablet TAKE 1 TABLET BY MOUTH EVERY DAY   buPROPion (WELLBUTRIN XL) 300 MG 24 hr tablet Take 1 tablet (300 mg total) by mouth daily.   citalopram (CELEXA) 40 MG tablet TAKE 1 TABLET BY MOUTH EVERY DAY   Continuous Blood Gluc Receiver (DEXCOM G7 RECEIVER) DEVI Use as directed.   Continuous Blood Gluc Sensor ([place]) MISC Attach to skin as directed. Replace every 10 days.   Cyanocobalamin (VITAMIN B12) 1000 MCG TBCR Take by mouth.   dapagliflozin propanediol (FARXIGA) 10 MG TABS tablet Take 1 tablet (10 mg total) by mouth daily before breakfast.   insulin degludec (TRESIBA FLEXTOUCH) 100 UNIT/ML FlexTouch Pen Inject 10 Units into the skin daily. (Patient taking differently: Inject 24 Units into the skin daily.)   Insulin Pen Needle 32G X 6 MM MISC Use as directed  with Insulin   loratadine (CLARITIN) 10 MG tablet    pantoprazole (PROTONIX) 20 MG tablet Take 1 tablet (20 mg total) by mouth daily.   ramipril (ALTACE) 5 MG capsule Take 1 capsule (5 mg total) by mouth daily.   VITAMIN D PO Take 5,000 Units by mouth.   Continuous Blood Gluc Receiver (DEXCOM G7 RECEIVER) DEVI 1 Act by Does not apply route daily.   Continuous Blood Gluc Sensor (DEXCOM G6 SENSOR) MISC  Apply to skin as directed. Change every 10 days.   Continuous Blood Gluc Sensor (DEXCOM G7 SENSOR) MISC 1 Act by Does not apply route daily.   Dulaglutide (TRULICITY) 3 JO/8.4ZY SOPN Inject 3 mg into the skin once a week.   No facility-administered encounter medications on file as of 01/27/2022.   REVIEW OF SYSTEMS  : All other systems reviewed and negative except where noted in the History of Present Illness.  PHYSICAL EXAM: BP 117/72   Pulse 74   Ht '5\' 6"'$  (1.676 m)   Wt 172 lb (78 kg)   LMP 02/02/2013   BMI 27.76 kg/m  General: Well developed white female in no acute distress Head: Normocephalic and atraumatic Eyes:  Sclerae anicteric,conjunctive pink. Lungs: Clear throughout to auscultation; no W/R/R. Heart: Regular rate and rhythm; no M/R/G. Abdomen: Soft, non-distended.  BS present.  Non-tender. Musculoskeletal: Symmetrical with no gross deformities  Skin: No lesions on visible extremities Extremities: No edema  Neurological: Alert oriented x 4, grossly non-focal Psychological:  Alert and cooperative. Normal mood and affect  Labs 09/2021: CBC with mildly elevated platelets of 403. LFTs nml. Lipase nml. Amylase nml. ESR nml. CRP nml. Vit B12 and folate nml. Copper level nml.   Gastric emptying study 12/16/21: IMPRESSION: Normal gastric emptying study.  EGD 11/11/21: - Normal esophagus. - Four gastric polyps. Resected and retrieved. - Erythematous mucosa in the antrum. Biopsied. - Two duodenal polyps. Resected and retrieved. Path: 1. Surgical [P], duodenal polyp bx's BENIGN GASTRIC HETEROTOPIA 2. Surgical [P], gastric REACTIVE GASTROPATHY WITH MINIMAL CHRONIC GASTRITIS NEGATIVE FOR H. PYLORI, INTESTINAL METAPLASIA, DYSPLASIA AND CARCINOMA 3. Surgical [P], gastric polyps, polyp (4) BENIGN FUNDIC GLAND POLYP MINIMAL CHRONIC GASTRITIS WITH REACTIVE EPITHELIAL CHANGES NEGATIVE FOR H. PYLORI, INTESTINAL METAPLASIA, DYSPLASIA AND CARCINOMA  Colonoscopy 11/11/21: - The  examined portion of the ileum was normal. - Two 3 to 4 mm polyps in the transverse colon and in the ascending colon, removed with a cold snare. Resected and retrieved. - Non-bleeding internal hemorrhoids. Path: 4. Surgical [P], colon, ascending, transverse, polyp (2) TUBULAR ADENOMA NEGATIVE FOR HIGH-GRADE DYSPLASIA AND CARCINOMA  ASSESSMENT AND PLAN: Epigastric ab pain Nausea GERD History of gastritis History of SIBO History of colon polyps Patient has had improvement in her abdominal pain after being treated with a 14 day course of rifaximin for SIBO. Will give her a trial of high dose PPI BID to see if this helps with her ab discomfort. Will check some additional labs to see if there is another contributor to her symptoms. In addition, patient does mention that she has fluctuations in her blood sugars, which may be contributing to her ab pain and nausea. - Previously educated on low FODMAP diet - Check TSH, TTG IgA, IgA, vitamin D, ferritin/IBC - Increase pantoprazole 40 mg to BID for 2 months, then decrease to QD - Referral to endocrinology to try to improve blood sugar control - RTC 2 months   I spent 41 minutes of time, including in depth chart review, independent review of  results as outlined above, communicating results with the patient directly, face-to-face time with the patient, coordinating care, ordering studies and medications as appropriate, and documentation.

## 2022-01-28 ENCOUNTER — Ambulatory Visit: Payer: BC Managed Care – PPO | Admitting: Internal Medicine

## 2022-01-28 LAB — IGA: Immunoglobulin A: 384 mg/dL — ABNORMAL HIGH (ref 70–320)

## 2022-01-28 LAB — TISSUE TRANSGLUTAMINASE, IGA: (tTG) Ab, IgA: <1 U/mL

## 2022-01-29 ENCOUNTER — Other Ambulatory Visit: Payer: Self-pay

## 2022-01-29 ENCOUNTER — Other Ambulatory Visit (HOSPITAL_COMMUNITY): Payer: Self-pay

## 2022-01-30 ENCOUNTER — Other Ambulatory Visit (HOSPITAL_COMMUNITY): Payer: Self-pay

## 2022-02-04 DIAGNOSIS — F4323 Adjustment disorder with mixed anxiety and depressed mood: Secondary | ICD-10-CM | POA: Diagnosis not present

## 2022-02-07 ENCOUNTER — Other Ambulatory Visit: Payer: Self-pay | Admitting: Internal Medicine

## 2022-02-07 DIAGNOSIS — Z713 Dietary counseling and surveillance: Secondary | ICD-10-CM | POA: Diagnosis not present

## 2022-02-17 ENCOUNTER — Other Ambulatory Visit: Payer: Self-pay | Admitting: Internal Medicine

## 2022-02-17 ENCOUNTER — Other Ambulatory Visit (HOSPITAL_COMMUNITY): Payer: Self-pay

## 2022-02-17 DIAGNOSIS — E118 Type 2 diabetes mellitus with unspecified complications: Secondary | ICD-10-CM

## 2022-02-17 DIAGNOSIS — E119 Type 2 diabetes mellitus without complications: Secondary | ICD-10-CM

## 2022-02-17 MED ORDER — TECHLITE PEN NEEDLES 32G X 6 MM MISC
1.0000 | Freq: Every day | 0 refills | Status: DC
  Filled 2022-02-17: qty 100, 90d supply, fill #0

## 2022-02-17 MED FILL — Citalopram Hydrobromide Tab 40 MG (Base Equiv): ORAL | 90 days supply | Qty: 90 | Fill #0 | Status: CN

## 2022-02-17 MED FILL — Dapagliflozin Propanediol Tab 10 MG (Base Equivalent): ORAL | 90 days supply | Qty: 90 | Fill #0 | Status: AC

## 2022-02-18 ENCOUNTER — Other Ambulatory Visit (HOSPITAL_COMMUNITY): Payer: Self-pay

## 2022-02-18 ENCOUNTER — Other Ambulatory Visit: Payer: Self-pay

## 2022-02-19 ENCOUNTER — Other Ambulatory Visit (HOSPITAL_COMMUNITY): Payer: Self-pay

## 2022-02-20 ENCOUNTER — Other Ambulatory Visit (HOSPITAL_COMMUNITY): Payer: Self-pay

## 2022-02-21 ENCOUNTER — Other Ambulatory Visit (HOSPITAL_COMMUNITY): Payer: Self-pay

## 2022-02-21 ENCOUNTER — Other Ambulatory Visit: Payer: Self-pay

## 2022-02-21 ENCOUNTER — Telehealth: Payer: Self-pay

## 2022-02-21 NOTE — Telephone Encounter (Signed)
Key: [id number] Approved Effective from 02/21/2022 through 02/20/2023.

## 2022-02-23 ENCOUNTER — Other Ambulatory Visit: Payer: Self-pay | Admitting: Internal Medicine

## 2022-02-23 DIAGNOSIS — I10 Essential (primary) hypertension: Secondary | ICD-10-CM

## 2022-02-23 DIAGNOSIS — E118 Type 2 diabetes mellitus with unspecified complications: Secondary | ICD-10-CM

## 2022-02-25 DIAGNOSIS — F4323 Adjustment disorder with mixed anxiety and depressed mood: Secondary | ICD-10-CM | POA: Diagnosis not present

## 2022-02-27 DIAGNOSIS — Z713 Dietary counseling and surveillance: Secondary | ICD-10-CM | POA: Diagnosis not present

## 2022-03-17 ENCOUNTER — Other Ambulatory Visit (HOSPITAL_COMMUNITY): Payer: Self-pay

## 2022-03-17 ENCOUNTER — Other Ambulatory Visit: Payer: Self-pay

## 2022-03-17 MED FILL — Citalopram Hydrobromide Tab 40 MG (Base Equiv): ORAL | 90 days supply | Qty: 90 | Fill #0 | Status: AC

## 2022-03-18 DIAGNOSIS — F4323 Adjustment disorder with mixed anxiety and depressed mood: Secondary | ICD-10-CM | POA: Diagnosis not present

## 2022-03-19 DIAGNOSIS — Z1231 Encounter for screening mammogram for malignant neoplasm of breast: Secondary | ICD-10-CM | POA: Diagnosis not present

## 2022-03-19 LAB — HM MAMMOGRAPHY

## 2022-03-22 ENCOUNTER — Other Ambulatory Visit (HOSPITAL_COMMUNITY): Payer: Self-pay

## 2022-03-24 ENCOUNTER — Other Ambulatory Visit (HOSPITAL_COMMUNITY): Payer: Self-pay

## 2022-03-25 ENCOUNTER — Encounter: Payer: Self-pay | Admitting: "Endocrinology

## 2022-03-25 ENCOUNTER — Other Ambulatory Visit (HOSPITAL_COMMUNITY): Payer: Self-pay

## 2022-03-25 ENCOUNTER — Ambulatory Visit (INDEPENDENT_AMBULATORY_CARE_PROVIDER_SITE_OTHER): Payer: Self-pay | Admitting: "Endocrinology

## 2022-03-25 VITALS — BP 94/60 | HR 60 | Ht 66.0 in | Wt 172.0 lb

## 2022-03-25 DIAGNOSIS — I10 Essential (primary) hypertension: Secondary | ICD-10-CM

## 2022-03-25 DIAGNOSIS — E782 Mixed hyperlipidemia: Secondary | ICD-10-CM

## 2022-03-25 DIAGNOSIS — E1169 Type 2 diabetes mellitus with other specified complication: Secondary | ICD-10-CM

## 2022-03-25 DIAGNOSIS — Z794 Long term (current) use of insulin: Secondary | ICD-10-CM

## 2022-03-25 DIAGNOSIS — E118 Type 2 diabetes mellitus with unspecified complications: Secondary | ICD-10-CM

## 2022-03-25 LAB — POCT GLYCOSYLATED HEMOGLOBIN (HGB A1C): HbA1c, POC (controlled diabetic range): 6.2 % (ref 0.0–7.0)

## 2022-03-25 MED ORDER — RAMIPRIL 2.5 MG PO CAPS
2.5000 mg | ORAL_CAPSULE | Freq: Every day | ORAL | 1 refills | Status: DC
  Filled 2022-03-25: qty 90, 90d supply, fill #0
  Filled 2022-06-29: qty 90, 90d supply, fill #1

## 2022-03-25 NOTE — Progress Notes (Unsigned)
Endocrinology Consult Note       03/25/2022, 6:40 PM   Subjective:    Patient ID: [name], female    DOB: 01/08/1961.  [name] is being seen in consultation for management of currently uncontrolled symptomatic diabetes requested by  Janith Lima, MD.   Past Medical History:  Diagnosis Date   Depression    Diabetes mellitus without complication ([place])    GERD (gastroesophageal reflux disease)    Hypertension    Sleep apnea     Past Surgical History:  Procedure Laterality Date   COLONOSCOPY  2013   hypo plastic polyps at [facility] in [place] ESOPHAGOGASTRODUODENOSCOPY (EGD)  11/11/2021   DIAGNOSTIC LAPAROSCOPY     DILATION AND CURETTAGE OF UTERUS N/A 05/09/2020   Procedure: CERVICAL DILATATION AND ENDOCERVICAL CURETTAGE;  Surgeon: Drema Dallas, DO;  Location: [place];  Service: Gynecology;  Laterality: N/A;   LABIOPLASTY Left 08/10/2017   Procedure: LABIAPLASTY REVISION;  Surgeon: Thurnell Lose, MD;  Location: [place];  Service: Gynecology;  Laterality: Left;  Labiaplasty revision   UPPER GASTROINTESTINAL ENDOSCOPY      Social History   Socioeconomic History   Marital status: Married    Spouse name: Girard Cooter "Ronalee Belts"   Number of children: 0   Years of education: Not on file   Highest education level: Not on file  Occupational History   Occupation: accountent  Tobacco Use   Smoking status: Former    Years: 10.00    Types: Cigarettes   Smokeless tobacco: Never   Tobacco comments:    Not every day smoker in the past  Vaping Use   Vaping Use: Never used  Substance and Sexual Activity   Alcohol use: Yes    Comment: rarely about 2 drinks per month   Drug use: Never   Sexual activity: Not Currently    Birth control/protection: Post-menopausal  Other Topics Concern   Not on file  Social History Narrative   Right  handed    Lives in a one story home    Social Determinants of Health   Financial Resource Strain: Not on file  Food Insecurity: Not on file  Transportation Needs: Not on file  Physical Activity: Not on file  Stress: Not on file  Social Connections: Not on file    Family History  Problem Relation Age of Onset   Dementia Mother    Alzheimer's disease Mother    Hypertension Mother    Rheumatic fever Father    Bipolar disorder Sister    Diabetes Sister    High Cholesterol Sister    Bipolar disorder Brother    Depression Maternal Grandmother    Diabetes Maternal Grandfather    Pancreatic cancer Paternal Grandfather    Colon cancer Neg Hx    Colon polyps Neg Hx    Esophageal cancer Neg Hx    Rectal cancer Neg Hx    Stomach cancer Neg Hx     Outpatient Encounter Medications as of 03/25/2022  Medication Sig   co-enzyme Q-10 30 MG capsule Take 30 mg by mouth daily.  Lactobacillus (PROBIOTIC ACIDOPHILUS PO) Take 1 tablet by mouth daily.   OVER THE COUNTER MEDICATION GI Microb-X 1 tablet daily   OVER THE COUNTER MEDICATION Fodmate 1-2 capsules with each meal   atorvastatin (LIPITOR) 10 MG tablet TAKE 1 TABLET BY MOUTH EVERY DAY   buPROPion (WELLBUTRIN XL) 300 MG 24 hr tablet Take 1 tablet (300 mg total) by mouth daily.   citalopram (CELEXA) 40 MG tablet TAKE 1 TABLET BY MOUTH EVERY DAY   Continuous Blood Gluc Receiver (DEXCOM G7 RECEIVER) DEVI Use as directed.   Continuous Blood Gluc Sensor ([place]) MISC Attach to skin as directed. Replace every 10 days.   Cyanocobalamin (VITAMIN B12) 1000 MCG TBCR Take by mouth.   dapagliflozin propanediol (FARXIGA) 10 MG TABS tablet Take 1 tablet (10 mg total) by mouth daily before breakfast.   Insulin Pen Needle (TECHLITE PEN NEEDLES) 32G X 6 MM MISC Use as directed with Insulin   loratadine (CLARITIN) 10 MG tablet    pantoprazole (PROTONIX) 40 MG tablet TAKE 1 TABLET EVERY DAY (Patient taking differently: Take 40 mg by mouth 2 (two)  times daily.)   ramipril (ALTACE) 2.5 MG capsule Take 1 capsule (2.5 mg total) by mouth daily.   VITAMIN D PO Take 5,000 Units by mouth.   [DISCONTINUED] Continuous Blood Gluc Receiver (DEXCOM G7 RECEIVER) DEVI 1 Act by Does not apply route daily.   [DISCONTINUED] Continuous Blood Gluc Sensor (DEXCOM G6 SENSOR) MISC Apply to skin as directed. Change every 10 days.   [DISCONTINUED] Continuous Blood Gluc Sensor (DEXCOM G7 SENSOR) MISC 1 Act by Does not apply route daily.   [DISCONTINUED] Dulaglutide (TRULICITY) 3 0000000 SOPN Inject 3 mg into the skin once a week.   [DISCONTINUED] insulin degludec (TRESIBA FLEXTOUCH) 100 UNIT/ML FlexTouch Pen Inject 10 Units into the skin daily. (Patient taking differently: Inject 10-16 Units into the skin daily.)   [DISCONTINUED] ramipril (ALTACE) 5 MG capsule TAKE 1 CAPSULE BY MOUTH EVERY DAY   No facility-administered encounter medications on file as of 03/25/2022.    ALLERGIES: Allergies  Allergen Reactions   Flagyl [Metronidazole] Diarrhea   Metformin And Related Shortness Of Breath    VACCINATION STATUS: Immunization History  Administered Date(s) Administered   Influenza,inj,Quad PF,6+ Mos 10/08/2021   PFIZER Comirnaty(Gray Top)Covid-19 Tri-Sucrose Vaccine 05/28/2020   PFIZER(Purple Top)SARS-COV-2 Vaccination 03/30/2019, 04/20/2019, 11/23/2019   PNEUMOCOCCAL CONJUGATE-20 06/11/2020   Tdap 10/06/2008, 12/20/2018   Zoster Recombinat (Shingrix) 06/11/2020, 09/10/2020    HPI   Review of Systems  Objective:       03/25/2022    1:36 PM 01/27/2022    8:53 AM 12/27/2021    8:37 AM  Vitals with BMI  Height '5\' 6"'$  '5\' 6"'$  '5\' 6"'$   Weight 172 lbs 172 lbs 176 lbs  BMI 27.77 [id number] [id number]  Systolic 94 [id number]  Diastolic 60 72 75  Pulse 60 74 84    BP 94/60   Pulse 60   Ht '5\' 6"'$  (1.676 m)   Wt 172 lb (78 kg)   LMP 02/02/2013   BMI 27.76 kg/m   Wt Readings from Last 3 Encounters:  03/25/22 172 lb (78 kg)  01/27/22 172 lb (78 kg)  12/27/21 176 lb  (79.8 kg)     Physical Exam    CMP ( most recent) CMP     Component Value Date/Time   NA 137 10/08/2021 0928   K 3.9 10/08/2021 0928   CL 101 10/08/2021 0928   CO2 30 10/08/2021 [id number]  GLUCOSE 90 10/08/2021 0928   BUN 20 10/08/2021 0928   BUN 15 01/30/2020 0000   CREATININE 0.85 10/08/2021 0928   CALCIUM 9.7 10/08/2021 0928   PROT 7.4 10/18/2021 1036   ALBUMIN 4.4 10/08/2021 0928   AST 13 10/08/2021 0928   ALT 17 10/08/2021 0928   ALKPHOS 70 10/08/2021 0928   BILITOT 0.6 10/08/2021 0928   GFRNONAA >60 05/07/2020 1430   GFRAA >60 08/06/2017 1924     Diabetic Labs (most recent): Lab Results  Component Value Date   HGBA1C 6.2 03/25/2022   HGBA1C 7.2 (H) 08/27/2021   HGBA1C 5.5 08/21/2021   MICROALBUR <0.7 08/21/2021   MICROALBUR 0.9 09/10/2020     Lipid Panel ( most recent) Lipid Panel     Component Value Date/Time   CHOL 152 02/07/2021 1618   TRIG 83.0 02/07/2021 1618   HDL 47.80 02/07/2021 1618   CHOLHDL 3 02/07/2021 1618   VLDL 16.6 02/07/2021 1618   LDLCALC 88 02/07/2021 1618      Lab Results  Component Value Date   TSH 2.47 01/27/2022   TSH 2.39 09/10/2020   TSH 2.898 ***Test methodology is 3rd generation TSH*** 09/17/2006           Assessment & Plan:   1. Type 2 diabetes mellitus with other specified complication, with long-term current use of insulin (HCC) *** - HgB A1c - Comprehensive metabolic panel - Lipid panel - TSH - T4, free - VITAMIN D 25 Hydroxy (Vit-D Deficiency, Fractures) - Vitamin B12  2. Mixed hyperlipidemia ***  3. Primary hypertension *** - ramipril (ALTACE) 2.5 MG capsule; Take 1 capsule (2.5 mg total) by mouth daily.  Dispense: 90 capsule; Refill: 1  4. Type II diabetes mellitus with manifestations (HCC) *** - ramipril (ALTACE) 2.5 MG capsule; Take 1 capsule (2.5 mg total) by mouth daily.  Dispense: 90 capsule; Refill: 1   - [name] has currently uncontrolled symptomatic type 2 DM since  *** years  of age,  with most recent A1c of *** %. Recent labs reviewed. - I had a long discussion with her about the possible risk factors and  the pathology behind its diabetes and its complications. -her diabetes is complicated by *** and she remains at a high risk for more acute and chronic complications which include CAD, CVA, CKD, retinopathy, and neuropathy. These are all discussed in detail with her.  - I discussed all available options of managing her diabetes including de-escalation of medications. I have counseled her on Food as Medicine by adopting a Whole Food , Plant Predominant  ( WFPP) nutrition as recommended by [employer] of Lifestyle Medicine. Patient is encouraged to switch to  unprocessed or minimally processed  complex starch, adequate protein intake (mainly plant source), minimal liquid fat, plenty of fruits, and vegetables. -  she is advised to stick to a routine mealtimes to eat 3 complete meals a day and snack only when necessary ( to snack only to correct hypoglycemia BG <70 day time or <100 at night).   - she acknowledges that there is a room for improvement in her food and drink choices. - Further Specific Suggestion is made for her to avoid simple carbohydrates  from her diet including Cakes, Sweet Desserts, Ice Cream, Soda (diet and regular), Sweet Tea, Candies, Chips, Cookies, Store Bought Juices, Alcohol ,  Artificial Sweeteners,  Coffee Creamer, and "Sugar-free" Products. This will help patient to have more stable blood glucose profile and potentially avoid unintended weight gain.  The following Lifestyle Medicine recommendations according to [facility]) were discussed and offered to patient and she agrees to start the journey:  A.  Whole Foods, Plant-based plate comprising of fruits and vegetables, plant-based proteins, whole-grain carbohydrates was discussed in detail with the patient.   A list for source of those nutrients were also  provided to the patient.  Patient will use only water or unsweetened tea for hydration. B.  The need to stay away from risky substances including alcohol, smoking; obtaining 7 to 9 hours of restorative sleep, at least 150 minutes of moderate intensity exercise weekly, the importance of healthy social connections,  and stress reduction techniques were discussed. C.  A full color page of  Calorie density of various food groups per pound showing examples of each food groups was provided to the patient.  - she will be scheduled with Jearld Fenton, RDN, CDE for individualized diabetes education.  - I have approached her with the following individualized plan to manage  her diabetes and patient agrees:   - she will be initiated on basal insulin *** units daily at bedtime , and prandial insulin *** units 3 times a day with meals  for pre-meal BG readings of 90-'150mg'$ /dl, plus patient specific correction dose for unexpected hyperglycemia above '150mg'$ /dl, associated with strict monitoring of glucose 4 times a day-before meals and at bedtime. - she is warned not to take insulin without proper monitoring per orders. - Adjustment parameters are given to her for hypo and hyperglycemia in writing. - she is encouraged to call clinic for blood glucose levels less than 70 or above 300 mg /dl. - she is advised to continue ***, therapeutically suitable for patient . - her *** will be discontinued, risk outweighs benefit for this patient. - she is not a candidate for *** due to concurrent renal insufficiency.  - she will be considered for incretin therapy as appropriate next visit.  - Specific targets for  A1c;  LDL, HDL,  and Triglycerides were discussed with the patient.  2) Blood Pressure /Hypertension:  her blood pressure is *** controlled to target.   she is advised to continue her current medications including ***  mg p.o. daily with breakfast . 3) Lipids/Hyperlipidemia:   Review of her recent lipid panel  showed *** controlled  LDL at *** .  she  is advised to continue    *** mg daily at bedtime.  Side effects and precautions discussed with her.  4)  Weight/Diet:  Body mass index is 27.76 kg/m.  -  *** clearly complicating her diabetes care.   she is *** a candidate for weight loss. I discussed with her the fact that loss of 5 - 10% of her  current body weight will have the most impact on her diabetes management.  The above detailed  ACLM recommendations for nutrition, exercise, sleep, social life, avoidance of risky substances, the need for restorative sleep   information will also detailed on discharge instructions.  5) Chronic Care/Health Maintenance:  -she  *** on ACEI/ARB and Statin medications and  is encouraged to initiate and continue to follow up with Ophthalmology, Dentist,  Podiatrist at least yearly or according to recommendations, and advised to  *** stay away from smoking. I have recommended yearly flu vaccine and pneumonia vaccine at least every 5 years; moderate intensity exercise for up to 150 minutes weekly; and  sleep for 7- 9 hours a day.  - she is  advised to maintain  close follow up with Janith Lima, MD for primary care needs, as well as her other providers for optimal and coordinated care.   I spent 6*** minutes in the care of the patient today including review of labs from [facility], Lipids, Thyroid Function, Hematology (current and previous including abstractions from other facilities); face-to-face time discussing  her blood glucose readings/logs, discussing hypoglycemia and hyperglycemia episodes and symptoms, medications doses, her options of short and long term treatment based on the latest standards of care / guidelines;  discussion about incorporating lifestyle medicine;  and documenting the encounter. Risk reduction counseling performed per USPSTF guidelines to reduce *** obesity and cardiovascular risk factors.      Please refer to Patient Instructions for Blood Glucose  Monitoring and Insulin/Medications Dosing Guide"  in media tab for additional information. Please  also refer to " Patient Self Inventory" in the Media  tab for reviewed elements of pertinent patient history.  [name] participated in the discussions, expressed understanding, and voiced agreement with the above plans.  All questions were answered to her satisfaction. she is encouraged to contact clinic should she have any questions or concerns prior to her return visit.   Follow up plan: - Return in about 3 months (around 06/25/2022) for F/U with Pre-visit Labs, Meter/CGM/Logs, A1c here.  Glade Lloyd, MD [facility] [address] [place], [place] [address] Phone: [phone]  Fax: [phone]    03/25/2022, 6:40 PM  This note was partially dictated with voice recognition software. Similar sounding words can be transcribed inadequately or may not  be corrected upon review.

## 2022-03-25 NOTE — Patient Instructions (Signed)
                                     Advice for Weight Management  -For most of us the best way to lose weight is by diet management. Generally speaking, diet management means consuming less calories intentionally which over time brings about progressive weight loss.  This can be achieved more effectively by avoiding ultra processed carbohydrates, processed meats, unhealthy fats.    It is critically important to know your numbers: how much calorie you are consuming and how much calorie you need. More importantly, our carbohydrates sources should be unprocessed naturally occurring  complex starch food items.  It is always important to balance nutrition also by  appropriate intake of proteins (mainly plant-based), healthy fats/oils, plenty of fruits and vegetables.   -The American College of Lifestyle Medicine (ACL M) recommends nutrition derived mostly from Whole Food, Plant Predominant Sources example an apple instead of applesauce or apple pie. Eat Plenty of vegetables, Mushrooms, fruits, Legumes, Whole Grains, Nuts, seeds in lieu of processed meats, processed snacks/pastries red meat, poultry, eggs.  Use only water or unsweetened tea for hydration.  The College also recommends the need to stay away from risky substances including alcohol, smoking; obtaining 7-9 hours of restorative sleep, at least 150 minutes of moderate intensity exercise weekly, importance of healthy social connections, and being mindful of stress and seek help when it is overwhelming.    -Sticking to a routine mealtime to eat 3 meals a day and avoiding unnecessary snacks is shown to have a big role in weight control. Under normal circumstances, the only time we burn stored energy is when we are hungry, so allow  some hunger to take place- hunger means no food between appropriate meal times, only water.  It is not advisable to starve.   -It is better to avoid simple carbohydrates including:  Cakes, Sweet Desserts, Ice Cream, Soda (diet and regular), Sweet Tea, Candies, Chips, Cookies, Store Bought Juices, Alcohol in Excess of  1-2 drinks a day, Lemonade,  Artificial Sweeteners, Doughnuts, Coffee Creamers, "Sugar-free" Products, etc, etc.  This is not a complete list.....    -Consulting with certified diabetes educators is proven to provide you with the most accurate and current information on diet.  Also, you may be  interested in discussing diet options/exchanges , we can schedule a visit with [name], RDN, CDE for individualized nutrition education.  -Exercise: If you are able: 30 -60 minutes a day ,4 days a week, or 150 minutes of moderate intensity exercise weekly.    The longer the better if tolerated.  Combine stretch, strength, and aerobic activities.  If you were told in the past that you have high risk for cardiovascular diseases, or if you are currently symptomatic, you may seek evaluation by your heart doctor prior to initiating moderate to intense exercise programs.                                  Additional Care Considerations for Diabetes/Prediabetes   -Diabetes  is a chronic disease.  The most important care consideration is regular follow-up with your diabetes care provider with the goal being avoiding or delaying its complications and to take advantage of advances in medications and technology.  If appropriate actions are taken early enough, type 2 diabetes can even be   reversed.  Seek information from the right source.  - Whole Food, Plant Predominant Nutrition is highly recommended: Eat Plenty of vegetables, Mushrooms, fruits, Legumes, Whole Grains, Nuts, seeds in lieu of processed meats, processed snacks/pastries red meat, poultry, eggs as recommended by American College of  Lifestyle Medicine (ACLM).  -Type 2 diabetes is known to coexist with other important comorbidities such as high blood pressure and high cholesterol.  It is critical to control not only the  diabetes but also the high blood pressure and high cholesterol to minimize and delay the risk of complications including coronary artery disease, stroke, amputations, blindness, etc.  The good news is that this diet recommendation for type 2 diabetes is also very helpful for managing high cholesterol and high blood blood pressure.  - Studies showed that people with diabetes will benefit from a class of medications known as ACE inhibitors and statins.  Unless there are specific reasons not to be on these medications, the standard of care is to consider getting one from these groups of medications at an optimal doses.  These medications are generally considered safe and proven to help protect the heart and the kidneys.    - People with diabetes are encouraged to initiate and maintain regular follow-up with eye doctors, foot doctors, dentists , and if necessary heart and kidney doctors.     - It is highly recommended that people with diabetes quit smoking or stay away from smoking, and get yearly  flu vaccine and pneumonia vaccine at least every 5 years.  See above for additional recommendations on exercise, sleep, stress management , and healthy social connections.      

## 2022-03-29 ENCOUNTER — Other Ambulatory Visit (HOSPITAL_COMMUNITY): Payer: Self-pay

## 2022-04-01 ENCOUNTER — Ambulatory Visit (INDEPENDENT_AMBULATORY_CARE_PROVIDER_SITE_OTHER): Payer: BC Managed Care – PPO | Admitting: Internal Medicine

## 2022-04-01 ENCOUNTER — Encounter: Payer: Self-pay | Admitting: Internal Medicine

## 2022-04-01 ENCOUNTER — Other Ambulatory Visit (HOSPITAL_COMMUNITY): Payer: Self-pay

## 2022-04-01 VITALS — BP 104/68 | HR 72 | Ht 66.0 in | Wt 168.0 lb

## 2022-04-01 DIAGNOSIS — K219 Gastro-esophageal reflux disease without esophagitis: Secondary | ICD-10-CM | POA: Diagnosis not present

## 2022-04-01 DIAGNOSIS — R109 Unspecified abdominal pain: Secondary | ICD-10-CM | POA: Diagnosis not present

## 2022-04-01 DIAGNOSIS — R140 Abdominal distension (gaseous): Secondary | ICD-10-CM

## 2022-04-01 DIAGNOSIS — K580 Irritable bowel syndrome with diarrhea: Secondary | ICD-10-CM

## 2022-04-01 MED ORDER — RIFAXIMIN 550 MG PO TABS
550.0000 mg | ORAL_TABLET | Freq: Three times a day (TID) | ORAL | 0 refills | Status: AC
  Filled 2022-04-01 – 2022-04-02 (×2): qty 42, 14d supply, fill #0

## 2022-04-01 MED ORDER — PANTOPRAZOLE SODIUM 40 MG PO TBEC
40.0000 mg | DELAYED_RELEASE_TABLET | Freq: Every day | ORAL | 0 refills | Status: DC
  Filled 2022-04-01: qty 90, 90d supply, fill #0

## 2022-04-01 NOTE — Progress Notes (Signed)
04/01/2022 [name] [id number] 28-Apr-1960   HISTORY OF PRESENT ILLNESS: This is a 62 year old female presents for follow up of abdominal pain, GERD, and nausea.    Interval History: She does feel like her abdominal pain and bloating have improved slightly after she started taking probiotics and fiber. She does still have pain and bloating at times.  She does feel limited by only having a narrow list of foods that do not cause her to GI issues. The double dose of the pantoprazole really did not help with her symptoms. Usually laying down will help the pain. Usually the pain lasts for a few hours. Bowel habits are regular. She has 1-2 BMs per day. She saw an endocrinologist and was taken off of insulin.   Wt Readings from Last 3 Encounters:  04/01/22 168 lb (76.2 kg)  03/25/22 172 lb (78 kg)  01/27/22 172 lb (78 kg)    Outpatient Encounter Medications as of 04/01/2022  Medication Sig   atorvastatin (LIPITOR) 10 MG tablet TAKE 1 TABLET BY MOUTH EVERY DAY   buPROPion (WELLBUTRIN XL) 300 MG 24 hr tablet Take 1 tablet (300 mg total) by mouth daily.   citalopram (CELEXA) 40 MG tablet TAKE 1 TABLET BY MOUTH EVERY DAY   co-enzyme Q-10 30 MG capsule Take 30 mg by mouth daily.   Continuous Blood Gluc Receiver (DEXCOM G7 RECEIVER) DEVI Use as directed.   Continuous Blood Gluc Sensor ([place]) MISC Attach to skin as directed. Replace every 10 days.   Cyanocobalamin (VITAMIN B12) 1000 MCG TBCR Take by mouth.   dapagliflozin propanediol (FARXIGA) 10 MG TABS tablet Take 1 tablet (10 mg total) by mouth daily before breakfast.   Insulin Pen Needle (TECHLITE PEN NEEDLES) 32G X 6 MM MISC Use as directed with Insulin   Lactobacillus (PROBIOTIC ACIDOPHILUS PO) Take 1 tablet by mouth daily.   loratadine (CLARITIN) 10 MG tablet    OVER THE COUNTER MEDICATION GI Microb-X 1 tablet daily   OVER THE COUNTER MEDICATION Fodmate 1-2 capsules with each meal   pantoprazole (PROTONIX) 40 MG tablet  TAKE 1 TABLET EVERY DAY (Patient taking differently: Take 40 mg by mouth 2 (two) times daily.)   ramipril (ALTACE) 2.5 MG capsule Take 1 capsule (2.5 mg total) by mouth daily.   VITAMIN D PO Take 5,000 Units by mouth.   No facility-administered encounter medications on file as of 04/01/2022.   PHYSICAL EXAM: BP 104/68   Pulse 72   Ht '5\' 6"'$  (1.676 m)   Wt 168 lb (76.2 kg)   LMP 02/02/2013   SpO2 98%   BMI 27.12 kg/m  General: Well developed white female in no acute distress Head: Normocephalic and atraumatic Eyes:  Sclerae anicteric,conjunctive pink. Lungs: Clear throughout to auscultation; no W/R/R. Heart: Regular rate and rhythm; no M/R/G. Abdomen: Soft, non-distended.  BS present.  Non-tender. Musculoskeletal: Symmetrical with no gross deformities  Skin: No lesions on visible extremities Extremities: No edema  Neurological: Alert oriented x 4, grossly non-focal Psychological:  Alert and cooperative. Normal mood and affect  Labs 09/2021: CBC with mildly elevated platelets of 403. LFTs nml. Lipase nml. Amylase nml. ESR nml. CRP nml. Vit B12 and folate nml. Copper level nml.   Labs 01/2022: TTG IgA neg. TSH nml. Vit D nml. Iron/TIBC nml.   Gastric emptying study 12/16/21: IMPRESSION: Normal gastric emptying study.  EGD 11/11/21: - Normal esophagus. - Four gastric polyps. Resected and retrieved. - Erythematous mucosa in the antrum. Biopsied. -  Two duodenal polyps. Resected and retrieved. Path: 1. Surgical [P], duodenal polyp bx's BENIGN GASTRIC HETEROTOPIA 2. Surgical [P], gastric REACTIVE GASTROPATHY WITH MINIMAL CHRONIC GASTRITIS NEGATIVE FOR H. PYLORI, INTESTINAL METAPLASIA, DYSPLASIA AND CARCINOMA 3. Surgical [P], gastric polyps, polyp (4) BENIGN FUNDIC GLAND POLYP MINIMAL CHRONIC GASTRITIS WITH REACTIVE EPITHELIAL CHANGES NEGATIVE FOR H. PYLORI, INTESTINAL METAPLASIA, DYSPLASIA AND CARCINOMA  Colonoscopy 11/11/21: - The examined portion of the ileum was normal. -  Two 3 to 4 mm polyps in the transverse colon and in the ascending colon, removed with a cold snare. Resected and retrieved. - Non-bleeding internal hemorrhoids. Path: 4. Surgical [P], colon, ascending, transverse, polyp (2) TUBULAR ADENOMA NEGATIVE FOR HIGH-GRADE DYSPLASIA AND CARCINOMA  ASSESSMENT AND PLAN: Epigastric ab pain Nausea GERD History of gastritis History of SIBO History of colon polyps Patient has noted additional improvement in her abdominal pain and bloating due to the addition of fiber and probiotic therapy.  She has been diligent about trying to follow a low FODMAP diet.  She does feel like a previous course of rifaximin was the most beneficial for her symptoms.  Thus we will plan to give her another course of treatment for SIBO to see if this gives her further improvement in her abdominal pain and bloating.  Increasing her PPI therapy did not seem to be effective so we will drop her back down to once a day. - Previously educated on low FODMAP diet - Will try another course of rifaximin 550 mg TID for 14 days - Will decrease from pantoprazole 40 mg BID to QD - RTC 2-3 months   I spent 40 minutes of time, including in depth chart review, independent review of results as outlined above, communicating results with the patient directly, face-to-face time with the patient, coordinating care, ordering studies and medications as appropriate, and documentation.

## 2022-04-01 NOTE — Patient Instructions (Addendum)
We have sent the following medications to your pharmacy for you to pick up at your convenience:  Start taking Pantoprazole 40 mg 1 tablet daily  Rifaximin 550 mg take 1 tablet by mouth 3 times daily for 14 days   You are schedule for follow up visit on 07/02/22 at 8:50 am  If your blood pressure at your visit was 140/90 or greater, please contact your primary care physician to follow up on this.  If you are age 63 or older, your body mass index should be between 23-30. Your Body mass index is 27.12 kg/m. If this is out of the aforementioned range listed, please consider follow up with your Primary Care Provider.  If you are age 65 or younger, your body mass index should be between 19-25. Your Body mass index is 27.12 kg/m. If this is out of the aformentioned range listed, please consider follow up with your Primary Care Provider.   The [facility] providers would like to encourage you to use [facility] to communicate with providers for non-urgent requests or questions.  Due to long hold times on the telephone, sending your provider a message by [facility] may be a faster and more efficient way to get a response.  Please allow 48 business hours for a response.  Please remember that this is for non-urgent requests.   Thank you for entrusting me with your care and choosing [facility].  Dr [name]

## 2022-04-02 ENCOUNTER — Other Ambulatory Visit (HOSPITAL_COMMUNITY): Payer: Self-pay

## 2022-04-03 DIAGNOSIS — F4323 Adjustment disorder with mixed anxiety and depressed mood: Secondary | ICD-10-CM | POA: Diagnosis not present

## 2022-04-08 ENCOUNTER — Other Ambulatory Visit (HOSPITAL_COMMUNITY): Payer: Self-pay

## 2022-04-08 MED ORDER — CLINDAMYCIN HCL 300 MG PO CAPS
300.0000 mg | ORAL_CAPSULE | Freq: Three times a day (TID) | ORAL | 0 refills | Status: DC
  Filled 2022-04-08: qty 15, 5d supply, fill #0

## 2022-04-15 DIAGNOSIS — F4323 Adjustment disorder with mixed anxiety and depressed mood: Secondary | ICD-10-CM | POA: Diagnosis not present

## 2022-04-21 ENCOUNTER — Other Ambulatory Visit: Payer: Self-pay

## 2022-04-21 ENCOUNTER — Other Ambulatory Visit (HOSPITAL_COMMUNITY): Payer: Self-pay

## 2022-04-23 ENCOUNTER — Other Ambulatory Visit (HOSPITAL_COMMUNITY): Payer: Self-pay

## 2022-04-23 ENCOUNTER — Other Ambulatory Visit: Payer: Self-pay

## 2022-04-24 ENCOUNTER — Other Ambulatory Visit (HOSPITAL_COMMUNITY): Payer: Self-pay

## 2022-04-25 ENCOUNTER — Other Ambulatory Visit (HOSPITAL_COMMUNITY): Payer: Self-pay

## 2022-04-29 DIAGNOSIS — F4323 Adjustment disorder with mixed anxiety and depressed mood: Secondary | ICD-10-CM | POA: Diagnosis not present

## 2022-05-14 ENCOUNTER — Other Ambulatory Visit (HOSPITAL_COMMUNITY): Payer: Self-pay

## 2022-05-14 ENCOUNTER — Other Ambulatory Visit: Payer: Self-pay | Admitting: Internal Medicine

## 2022-05-14 ENCOUNTER — Other Ambulatory Visit: Payer: Self-pay

## 2022-05-14 DIAGNOSIS — F4323 Adjustment disorder with mixed anxiety and depressed mood: Secondary | ICD-10-CM | POA: Diagnosis not present

## 2022-05-14 MED ORDER — DEXCOM G7 SENSOR MISC
3 refills | Status: DC
  Filled 2022-05-14: qty 3, 30d supply, fill #0
  Filled 2022-06-17: qty 3, 30d supply, fill #1
  Filled 2022-08-01: qty 3, 30d supply, fill #2
  Filled 2022-08-30: qty 3, 30d supply, fill #3

## 2022-06-08 ENCOUNTER — Other Ambulatory Visit: Payer: Self-pay | Admitting: Internal Medicine

## 2022-06-08 MED ORDER — CITALOPRAM HYDROBROMIDE 40 MG PO TABS
40.0000 mg | ORAL_TABLET | Freq: Every day | ORAL | 0 refills | Status: DC
  Filled 2022-06-17: qty 90, 90d supply, fill #0

## 2022-06-09 ENCOUNTER — Other Ambulatory Visit (HOSPITAL_COMMUNITY): Payer: Self-pay

## 2022-06-09 IMAGING — CR DG CHEST 2V
2 series · 2 of 2 positions shown · non-contrast
Comparison: 02/05/2014 from [HOSPITAL]

CLINICAL DATA: Pre-op respiratory exam

EXAM:
CHEST - 2 VIEW

[w chest pa]
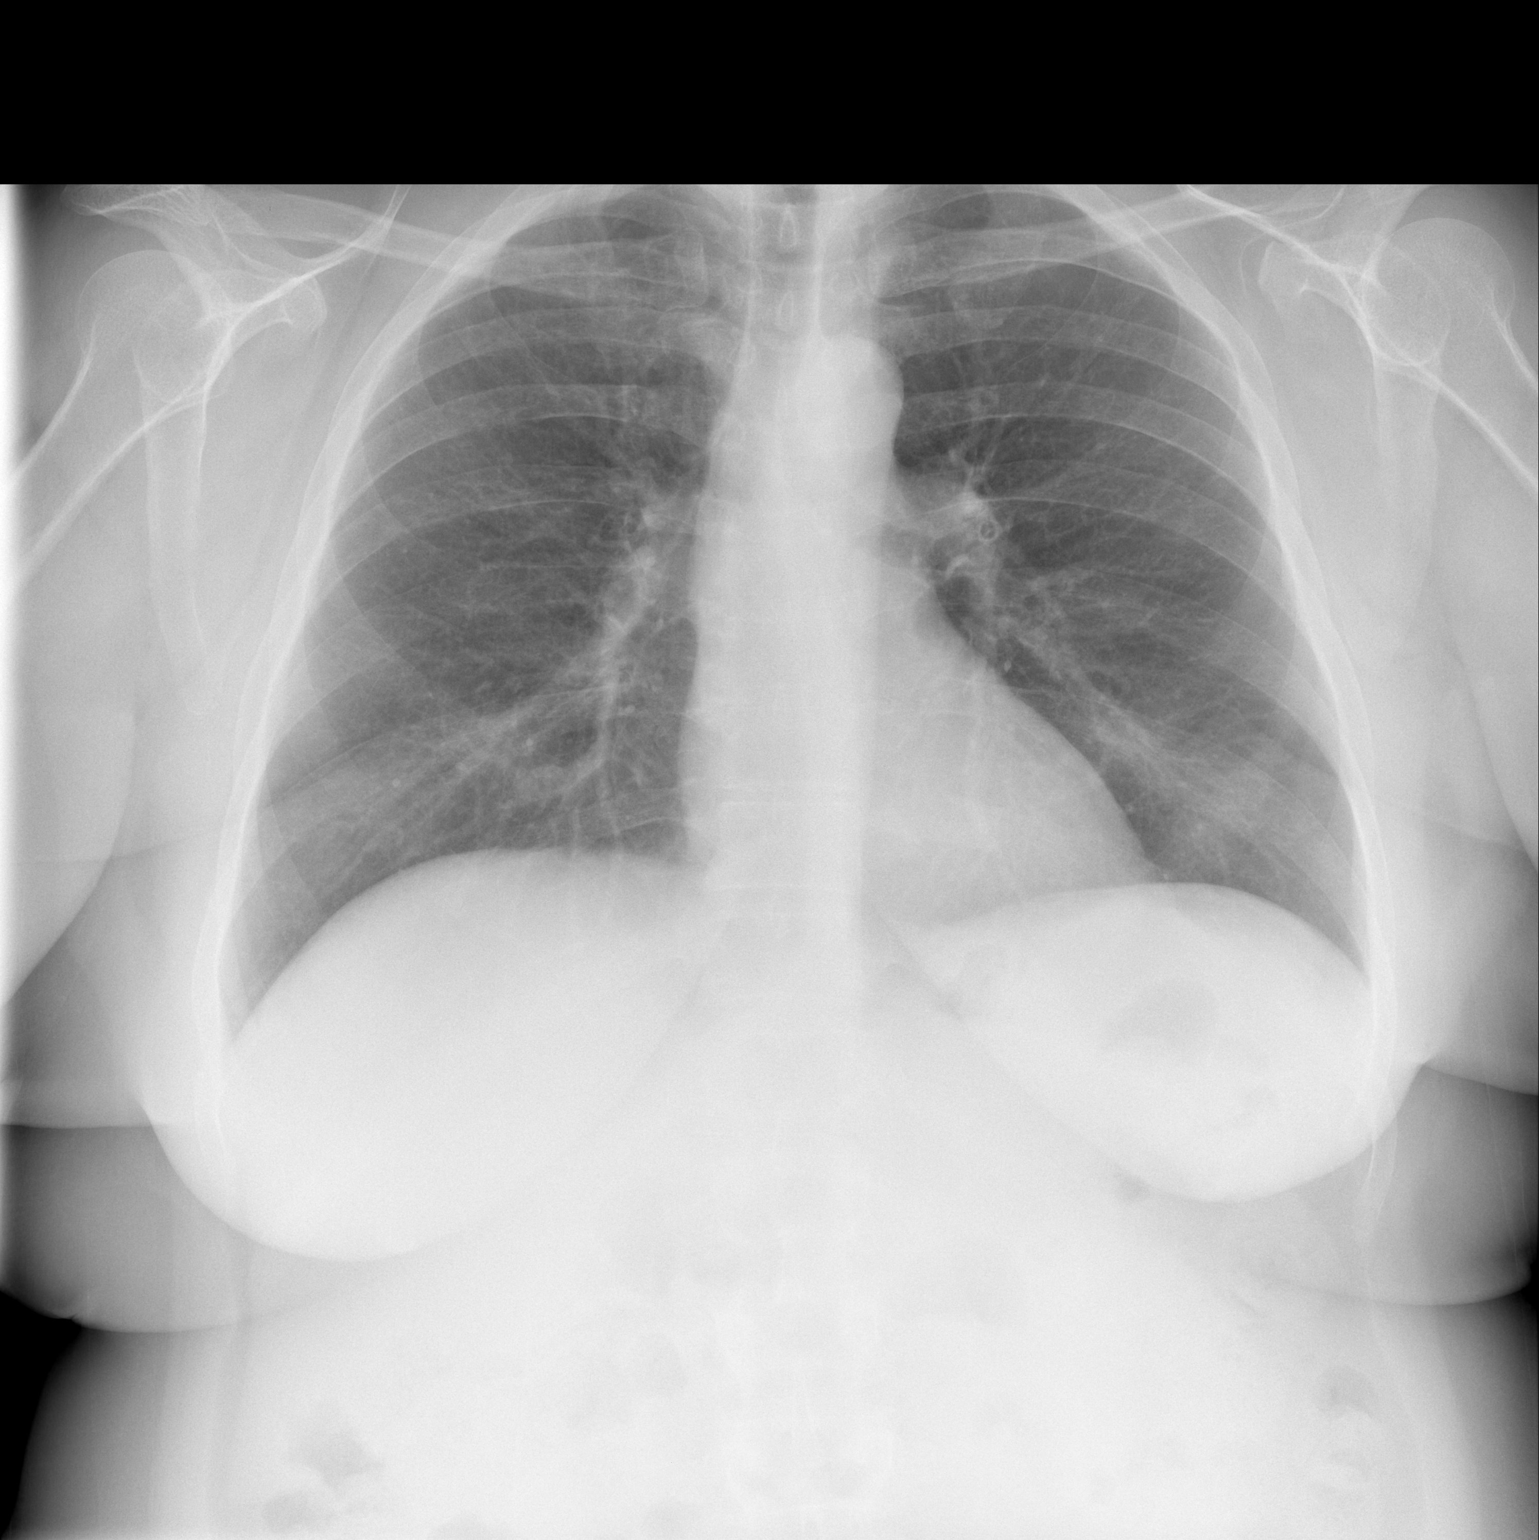

[w chest lat]
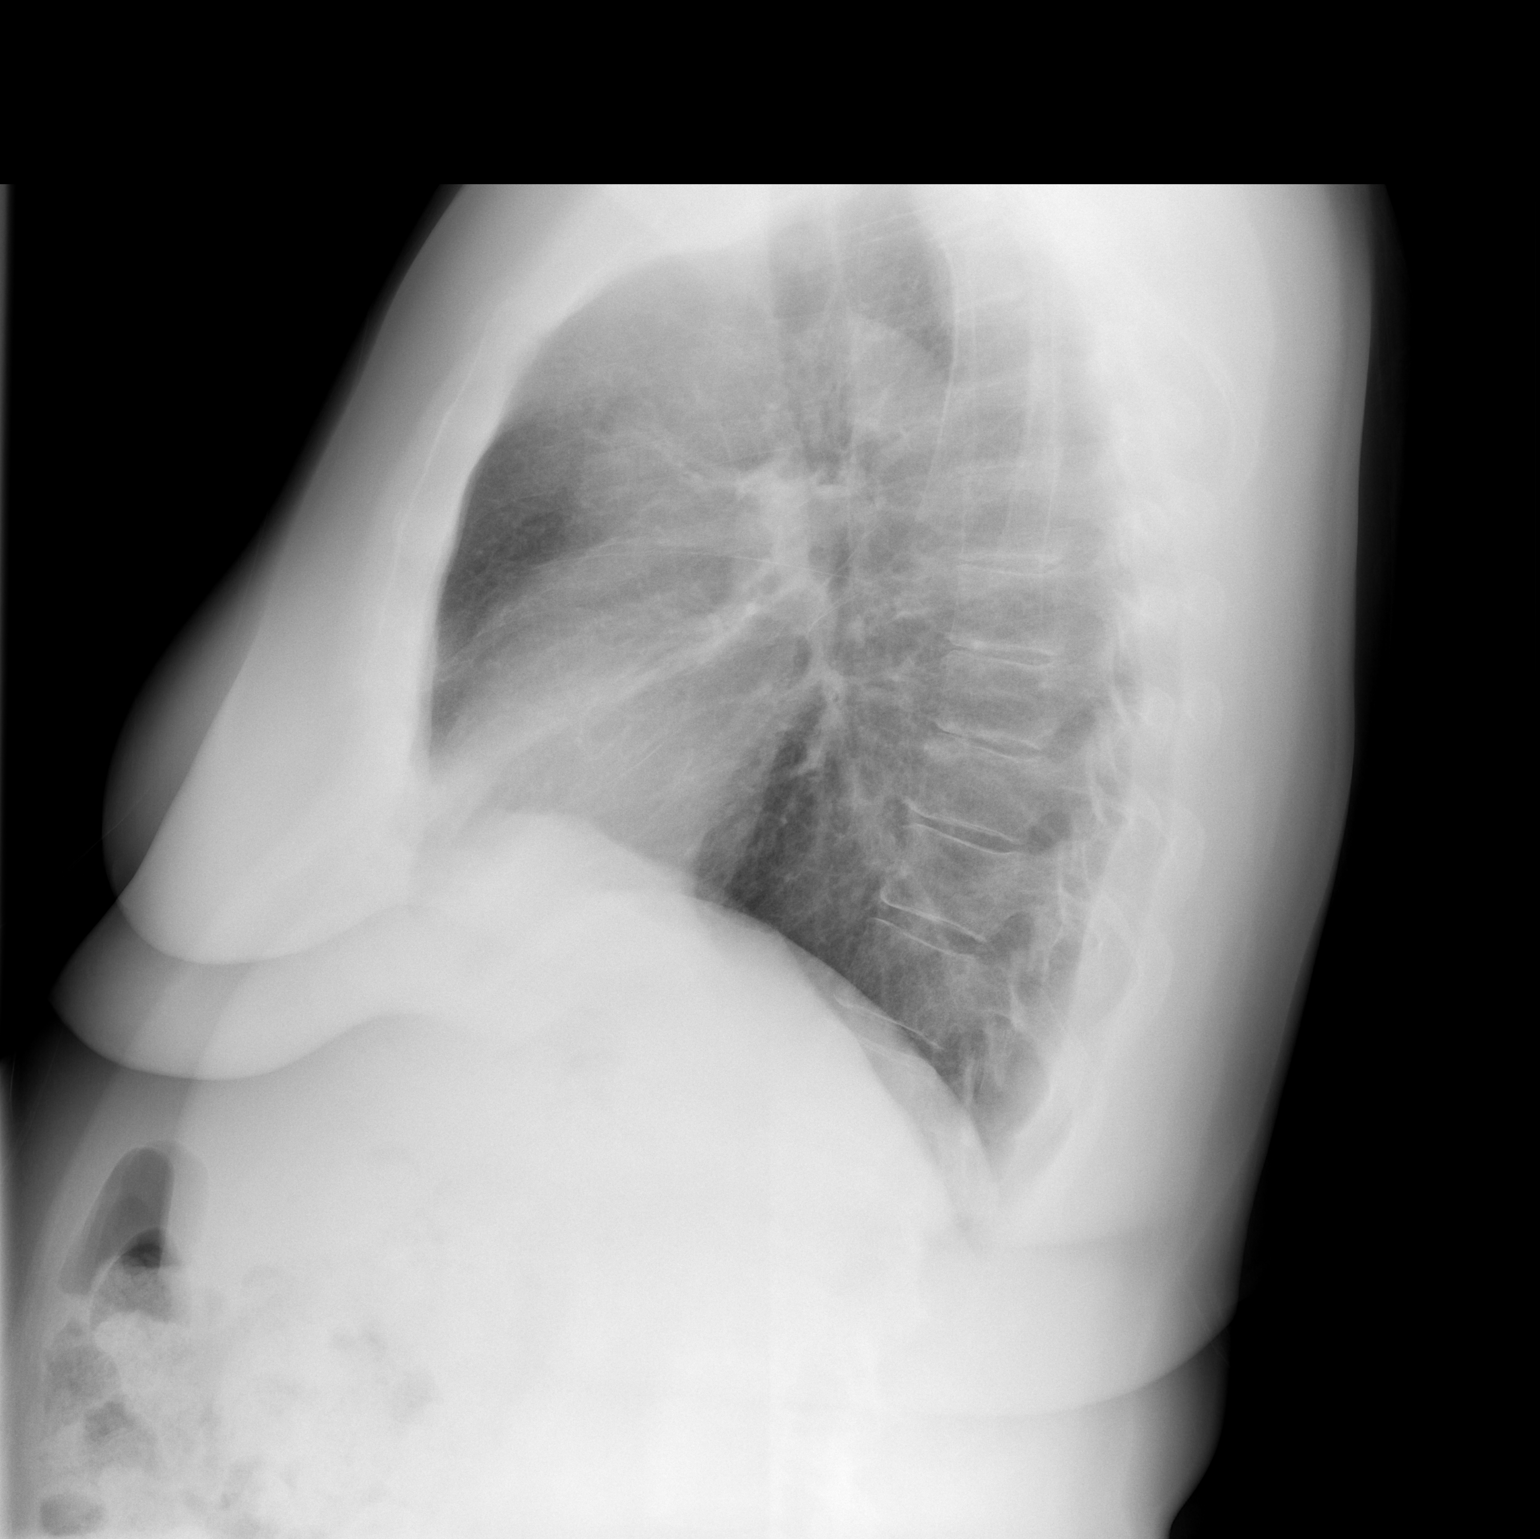

[2 of 2 positions shown; findings below may reference images not displayed]

FINDINGS: The heart size and mediastinal contours are within normal limits.
Both lungs are clear. The visualized skeletal structures are
unremarkable.
IMPRESSION: No active cardiopulmonary disease.

## 2022-06-10 DIAGNOSIS — H04123 Dry eye syndrome of bilateral lacrimal glands: Secondary | ICD-10-CM | POA: Diagnosis not present

## 2022-06-10 DIAGNOSIS — E119 Type 2 diabetes mellitus without complications: Secondary | ICD-10-CM | POA: Diagnosis not present

## 2022-06-10 DIAGNOSIS — H2513 Age-related nuclear cataract, bilateral: Secondary | ICD-10-CM | POA: Diagnosis not present

## 2022-06-10 DIAGNOSIS — H40013 Open angle with borderline findings, low risk, bilateral: Secondary | ICD-10-CM | POA: Diagnosis not present

## 2022-06-10 LAB — HM DIABETES EYE EXAM

## 2022-06-12 DIAGNOSIS — Z713 Dietary counseling and surveillance: Secondary | ICD-10-CM | POA: Diagnosis not present

## 2022-06-13 DIAGNOSIS — F4323 Adjustment disorder with mixed anxiety and depressed mood: Secondary | ICD-10-CM | POA: Diagnosis not present

## 2022-06-17 ENCOUNTER — Other Ambulatory Visit: Payer: Self-pay

## 2022-06-19 ENCOUNTER — Encounter: Payer: Self-pay | Admitting: Emergency Medicine

## 2022-06-19 ENCOUNTER — Other Ambulatory Visit (HOSPITAL_COMMUNITY): Payer: Self-pay

## 2022-06-19 ENCOUNTER — Ambulatory Visit (INDEPENDENT_AMBULATORY_CARE_PROVIDER_SITE_OTHER): Payer: BC Managed Care – PPO | Admitting: Emergency Medicine

## 2022-06-19 VITALS — BP 118/72 | HR 67 | Temp 98.0°F | Ht 66.0 in | Wt 164.5 lb

## 2022-06-19 DIAGNOSIS — F4321 Adjustment disorder with depressed mood: Secondary | ICD-10-CM | POA: Diagnosis not present

## 2022-06-19 DIAGNOSIS — F418 Other specified anxiety disorders: Secondary | ICD-10-CM | POA: Diagnosis not present

## 2022-06-19 MED ORDER — ALPRAZOLAM 0.5 MG PO TABS
0.5000 mg | ORAL_TABLET | Freq: Two times a day (BID) | ORAL | 1 refills | Status: DC | PRN
  Filled 2022-06-19: qty 30, 15d supply, fill #0
  Filled 2022-09-25: qty 30, 15d supply, fill #1

## 2022-06-19 NOTE — Assessment & Plan Note (Signed)
Brought on by grieving process Active and affecting quality of life Recent loss of her husband Will benefit from alprazolam 0.5 mg as needed Advised to follow-up with PCP in the next several weeks

## 2022-06-19 NOTE — Patient Instructions (Signed)
Managing Anxiety, Adult After being diagnosed with anxiety, you may be relieved to know why you have felt or behaved a certain way. You may also feel overwhelmed about the treatment ahead and what it will mean for your life. With care and support, you can manage your anxiety. How to manage lifestyle changes Understanding the difference between stress and anxiety Although stress can play a role in anxiety, it is not the same as anxiety. Stress is your body's reaction to life changes and events, both good and bad. Stress is often caused by something external, such as a deadline, test, or competition. It normally goes away after the event has ended and will last just a few hours. But, stress can be ongoing and can lead to more than just stress. Anxiety is caused by something internal, such as imagining a terrible outcome or worrying that something will go wrong that will greatly upset you. Anxiety often does not go away even after the event is over, and it can become a long-term (chronic) worry. Lowering stress and anxiety Talk with your health care provider or a counselor to learn more about lowering anxiety and stress. They may suggest tension-reduction techniques, such as: Music. Spend time creating or listening to music that you enjoy and that inspires you. Mindfulness-based meditation. Practice being aware of your normal breaths while not trying to control your breathing. It can be done while sitting or walking. Centering prayer. Focus on a word, phrase, or sacred image that means something to you and brings you peace. Deep breathing. Expand your stomach and inhale slowly through your nose. Hold your breath for 3-5 seconds. Then breathe out slowly, letting your stomach muscles relax. Self-talk. Learn to notice and spot thought patterns that lead to anxiety reactions. Change those patterns to thoughts that feel peaceful. Muscle relaxation. Take time to tense muscles and then relax them. Choose a  tension-reduction technique that fits your lifestyle and personality. These techniques take time and practice. Set aside 5-15 minutes a day to do them. Specialized therapists can offer counseling and training in these techniques. The training to help with anxiety may be covered by some insurance plans. Other things you can do to manage stress and anxiety include: Keeping a stress diary. This can help you learn what triggers your reaction and then learn ways to manage your response. Thinking about how you react to certain situations. You may not be able to control everything, but you can control your response. Making time for activities that help you relax and not feeling guilty about spending your time in this way. Doing visual imagery. This involves imagining or creating mental pictures to help you relax. Practicing yoga. Through yoga poses, you can lower tension and relax.  Medicines Medicines for anxiety include: Antidepressant medicines. These are usually prescribed for long-term daily control. Anti-anxiety medicines. These may be added in severe cases, especially when panic attacks occur. When used together, medicines, psychotherapy, and tension-reduction techniques may be the most effective treatment. Relationships Relationships can play a big part in helping you recover. Spend more time connecting with trusted friends and family members. Think about going to couples counseling if you have a partner, taking family education classes, or going to family therapy. Therapy can help you and others better understand your anxiety. How to recognize changes in your anxiety Everyone responds differently to treatment for anxiety. Recovery from anxiety happens when symptoms lessen and stop interfering with your daily life at home or work. This may mean that you  will start to: Have better concentration and focus. Worry will interfere less in your daily thinking. Sleep better. Be less irritable. Have more  energy. Have improved memory. Try to recognize when your condition is getting worse. Contact your provider if your symptoms interfere with home or work and you feel like your condition is not improving. Follow these instructions at home: Activity Exercise. Adults should: Exercise for at least 150 minutes each week. The exercise should increase your heart rate and make you sweat (moderate-intensity exercise). Do strengthening exercises at least twice a week. Get the right amount and quality of sleep. Most adults need 7-9 hours of sleep each night. Lifestyle  Eat a healthy diet that includes plenty of vegetables, fruits, whole grains, low-fat dairy products, and lean protein. Do not eat a lot of foods that are high in fats, added sugars, or salt (sodium). Make choices that simplify your life. Do not use any products that contain nicotine or tobacco. These products include cigarettes, chewing tobacco, and vaping devices, such as e-cigarettes. If you need help quitting, ask your provider. Avoid caffeine, alcohol, and certain over-the-counter cold medicines. These may make you feel worse. Ask your pharmacist which medicines to avoid. General instructions Take over-the-counter and prescription medicines only as told by your provider. Keep all follow-up visits. This is to make sure you are managing your anxiety well or if you need more support. Where to find support You can get help and support from: Self-help groups. Online and [employer]. A trusted spiritual leader. Couples counseling. Family education classes. Family therapy. Where to find more information You may find that joining a support group helps you deal with your anxiety. The following sources can help you find counselors or support groups near you: Mental Health [place]: mentalhealthamerica.net Anxiety and Depression Association of [place] (ADAA): adaa.org The [employer] on Mental Illness (NAMI): nami.org Contact  a health care provider if: You have a hard time staying focused or finishing tasks. You spend many hours a day feeling worried about everyday life. You are very tired because you cannot stop worrying. You start to have headaches or often feel tense. You have chronic nausea or diarrhea. Get help right away if: Your heart feels like it is racing. You have shortness of breath. You have thoughts of hurting yourself or others. Get help right away if you feel like you may hurt yourself or others, or have thoughts about taking your own life. Go to your nearest emergency room or: Call 911. Call the National Suicide Prevention Lifeline at 862-051-9879 or 988. This is open 24 hours a day. Text the Crisis Text Line at 832-604-2586. This information is not intended to replace advice given to you by your health care provider. Make sure you discuss any questions you have with your health care provider. Document Revised: 10/15/2021 Document Reviewed: 04/29/2020 Elsevier Patient Education  2024 [employer].

## 2022-06-19 NOTE — Progress Notes (Signed)
[name] 62 y.o.   Chief Complaint  Patient presents with   Anxiety    Patient states that her anxiety is new, after death in family, trouble sleeping     HISTORY OF PRESENT ILLNESS: Acute problem visit today.  Patient of Dr. Sanda Linger. This is a 62 y.o. female grieving recent loss of her husband in March 13th Complaining of intermittent anxiety episodes.  Trouble sleeping. No other complaints or medical concerns today.  Anxiety Symptoms include nervous/anxious behavior. Patient reports no chest pain, palpitations or shortness of breath.       Prior to Admission medications   Medication Sig Start Date End Date Taking? Authorizing Provider  atorvastatin (LIPITOR) 10 MG tablet TAKE 1 TABLET BY MOUTH EVERY DAY 01/18/22  Yes [name], MD  buPROPion (WELLBUTRIN XL) 300 MG 24 hr tablet Take 1 tablet (300 mg total) by mouth daily. 12/30/21  Yes [name], MD  citalopram (CELEXA) 40 MG tablet Take 1 tablet (40 mg total) by mouth daily. 06/08/22  Yes [name], MD  clindamycin (CLEOCIN) 300 MG capsule Take 1 capsule (300 mg total) by mouth every 8 (eight) hours. 04/01/22  Yes   co-enzyme Q-10 30 MG capsule Take 30 mg by mouth daily.   Yes [provider]  Continuous Glucose Receiver (DEXCOM G7 RECEIVER) DEVI Use as directed. 09/21/21  Yes [name], MD  Continuous Glucose Sensor (DEXCOM G7 SENSOR) MISC Attach to skin as directed. Replace every 10 days. 05/14/22  Yes [name], MD  Cyanocobalamin (VITAMIN B12) 1000 MCG TBCR Take by mouth.   Yes [provider]  dapagliflozin propanediol (FARXIGA) 10 MG TABS tablet Take 1 tablet (10 mg total) by mouth daily before breakfast. 11/14/21  Yes [name], MD  Insulin Pen Needle (TECHLITE PEN NEEDLES) 32G X 6 MM MISC Use as directed with Insulin 02/17/22  Yes [name], MD  Lactobacillus (PROBIOTIC ACIDOPHILUS PO) Take 1 tablet by mouth daily.   Yes [provider]   loratadine (CLARITIN) 10 MG tablet  10/02/21  Yes [provider]  OVER THE COUNTER MEDICATION GI Microb-X 1 tablet daily   Yes [provider]  OVER THE COUNTER MEDICATION Fodmate 1-2 capsules with each meal   Yes [provider]  pantoprazole (PROTONIX) 40 MG tablet Take 1 tablet (40 mg total) by mouth daily. 04/01/22  Yes Imogene Burn, MD  ramipril (ALTACE) 2.5 MG capsule Take 1 capsule (2.5 mg total) by mouth daily. 03/25/22  Yes Roma Kayser, MD  VITAMIN D PO Take 5,000 Units by mouth.   Yes [provider]    Allergies  Allergen Reactions   Flagyl [Metronidazole] Diarrhea   Metformin And Related Shortness Of Breath    Patient Active Problem List   Diagnosis Date Noted   Insulin-requiring or dependent type II diabetes mellitus (HCC) 11/25/2021   Nausea 11/05/2021   Change in bowel habits 11/05/2021   Belching 11/05/2021   Generalized abdominal tenderness without rebound tenderness 10/08/2021   Thrombocytosis 10/08/2021   Screening for colon cancer 09/26/2021   Age-related osteoporosis without current pathological fracture 08/21/2021   Atrophy of vagina 08/21/2021   Diabetic polyneuropathy associated with type 2 diabetes mellitus (HCC) 06/11/2020   Type II diabetes mellitus with manifestations (HCC) 06/11/2020   Primary hypertension 06/11/2020   Hyperlipidemia with target LDL less than 100 06/11/2020   Papanicolaou smear of cervix with atypical squamous cells cannot exclude high grade squamous  intraepithelial lesion (ASC-H) 05/09/2020   Climacteric 05/09/2020   Gastroesophageal reflux disease 05/09/2020   Major depression, single episode, in complete remission (HCC) 05/09/2020   Obesity 05/09/2020   Obstructive sleep apnea syndrome 05/09/2020   Vitamin D deficiency 05/09/2020   Allergic rhinitis 05/09/2020   Carpal tunnel syndrome 10/22/2017    Past Medical History:  Diagnosis Date   Depression    Diabetes mellitus without  complication (HCC)    GERD (gastroesophageal reflux disease)    Hypertension    Sleep apnea     Past Surgical History:  Procedure Laterality Date   COLONOSCOPY  2013   hypo plastic polyps at [facility] in [place]   COLONOSCOPY WITH ESOPHAGOGASTRODUODENOSCOPY (EGD)  11/11/2021   DIAGNOSTIC LAPAROSCOPY     DILATION AND CURETTAGE OF UTERUS N/A 05/09/2020   Procedure: CERVICAL DILATATION AND ENDOCERVICAL CURETTAGE;  Surgeon: Steva Ready, DO;  Location: [facility];  Service: Gynecology;  Laterality: N/A;   LABIOPLASTY Left 08/10/2017   Procedure: LABIAPLASTY REVISION;  Surgeon: Geryl Rankins, MD;  Location: [facility];  Service: Gynecology;  Laterality: Left;  Labiaplasty revision   UPPER GASTROINTESTINAL ENDOSCOPY      Social History   Socioeconomic History   Marital status: Married    Spouse name: Justin Mend "[name]"   Number of children: 0   Years of education: Not on file   Highest education level: Not on file  Occupational History   Occupation: accountent  Tobacco Use   Smoking status: Former    Years: 10    Types: Cigarettes   Smokeless tobacco: Never   Tobacco comments:    Not every day smoker in the past  Vaping Use   Vaping Use: Never used  Substance and Sexual Activity   Alcohol use: Yes    Comment: rarely about 2 drinks per month   Drug use: Never   Sexual activity: Not Currently    Birth control/protection: Post-menopausal  Other Topics Concern   Not on file  Social History Narrative   Right handed    Lives in a one story home    Social Determinants of Health   Financial Resource Strain: Not on file  Food Insecurity: Not on file  Transportation Needs: Not on file  Physical Activity: Not on file  Stress: Not on file  Social Connections: Not on file  Intimate Partner Violence: Not on file    Family History  Problem Relation Age of Onset   Dementia Mother    Alzheimer's disease Mother    Hypertension Mother     Rheumatic fever Father    Bipolar disorder Sister    Diabetes Sister    High Cholesterol Sister    Bipolar disorder Brother    Depression Maternal Grandmother    Diabetes Maternal Grandfather    Pancreatic cancer Paternal Grandfather    Colon cancer Neg Hx    Colon polyps Neg Hx    Esophageal cancer Neg Hx    Rectal cancer Neg Hx    Stomach cancer Neg Hx      Review of Systems  Constitutional: Negative.  Negative for chills and fever.  HENT: Negative.  Negative for congestion and sore throat.   Respiratory: Negative.  Negative for cough and shortness of breath.   Cardiovascular: Negative.  Negative for chest pain and palpitations.  Genitourinary: Negative.  Negative for dysuria and hematuria.  Skin: Negative.  Negative for rash.  Psychiatric/Behavioral:  The patient is nervous/anxious.   All other systems reviewed and  are negative.   Vitals:   06/19/22 0923  BP: 118/72  Pulse: 67  Temp: 98 F (36.7 C)  SpO2: 97%    Physical Exam Vitals reviewed.  Constitutional:      Appearance: Normal appearance.  HENT:     Head: Normocephalic.  Eyes:     Extraocular Movements: Extraocular movements intact.  Cardiovascular:     Rate and Rhythm: Normal rate.  Pulmonary:     Effort: Pulmonary effort is normal.  Skin:    General: Skin is warm and dry.  Neurological:     Mental Status: She is alert and oriented to person, place, and time.  Psychiatric:        Mood and Affect: Mood normal.        Behavior: Behavior normal.      ASSESSMENT & PLAN: Problem List Items Addressed This Visit       Other   Situational anxiety - Primary    Brought on by grieving process Active and affecting quality of life Recent loss of her husband Will benefit from alprazolam 0.5 mg as needed Advised to follow-up with PCP in the next several weeks      Relevant Medications   ALPRAZolam (XANAX) 0.5 MG tablet   Other Visit Diagnoses     Grieving          Patient Instructions   Managing Anxiety, Adult After being diagnosed with anxiety, you may be relieved to know why you have felt or behaved a certain way. You may also feel overwhelmed about the treatment ahead and what it will mean for your life. With care and support, you can manage your anxiety. How to manage lifestyle changes Understanding the difference between stress and anxiety Although stress can play a role in anxiety, it is not the same as anxiety. Stress is your body's reaction to life changes and events, both good and bad. Stress is often caused by something external, such as a deadline, test, or competition. It normally goes away after the event has ended and will last just a few hours. But, stress can be ongoing and can lead to more than just stress. Anxiety is caused by something internal, such as imagining a terrible outcome or worrying that something will go wrong that will greatly upset you. Anxiety often does not go away even after the event is over, and it can become a long-term (chronic) worry. Lowering stress and anxiety Talk with your health care provider or a counselor to learn more about lowering anxiety and stress. They may suggest tension-reduction techniques, such as: Music. Spend time creating or listening to music that you enjoy and that inspires you. Mindfulness-based meditation. Practice being aware of your normal breaths while not trying to control your breathing. It can be done while sitting or walking. Centering prayer. Focus on a word, phrase, or sacred image that means something to you and brings you peace. Deep breathing. Expand your stomach and inhale slowly through your nose. Hold your breath for 3-5 seconds. Then breathe out slowly, letting your stomach muscles relax. Self-talk. Learn to notice and spot thought patterns that lead to anxiety reactions. Change those patterns to thoughts that feel peaceful. Muscle relaxation. Take time to tense muscles and then relax them. Choose a  tension-reduction technique that fits your lifestyle and personality. These techniques take time and practice. Set aside 5-15 minutes a day to do them. Specialized therapists can offer counseling and training in these techniques. The training to help with anxiety may  be covered by some insurance plans. Other things you can do to manage stress and anxiety include: Keeping a stress diary. This can help you learn what triggers your reaction and then learn ways to manage your response. Thinking about how you react to certain situations. You may not be able to control everything, but you can control your response. Making time for activities that help you relax and not feeling guilty about spending your time in this way. Doing visual imagery. This involves imagining or creating mental pictures to help you relax. Practicing yoga. Through yoga poses, you can lower tension and relax.  Medicines Medicines for anxiety include: Antidepressant medicines. These are usually prescribed for long-term daily control. Anti-anxiety medicines. These may be added in severe cases, especially when panic attacks occur. When used together, medicines, psychotherapy, and tension-reduction techniques may be the most effective treatment. Relationships Relationships can play a big part in helping you recover. Spend more time connecting with trusted friends and family members. Think about going to couples counseling if you have a partner, taking family education classes, or going to family therapy. Therapy can help you and others better understand your anxiety. How to recognize changes in your anxiety Everyone responds differently to treatment for anxiety. Recovery from anxiety happens when symptoms lessen and stop interfering with your daily life at home or work. This may mean that you will start to: Have better concentration and focus. Worry will interfere less in your daily thinking. Sleep better. Be less irritable. Have more  energy. Have improved memory. Try to recognize when your condition is getting worse. Contact your provider if your symptoms interfere with home or work and you feel like your condition is not improving. Follow these instructions at home: Activity Exercise. Adults should: Exercise for at least 150 minutes each week. The exercise should increase your heart rate and make you sweat (moderate-intensity exercise). Do strengthening exercises at least twice a week. Get the right amount and quality of sleep. Most adults need 7-9 hours of sleep each night. Lifestyle  Eat a healthy diet that includes plenty of vegetables, fruits, whole grains, low-fat dairy products, and lean protein. Do not eat a lot of foods that are high in fats, added sugars, or salt (sodium). Make choices that simplify your life. Do not use any products that contain nicotine or tobacco. These products include cigarettes, chewing tobacco, and vaping devices, such as e-cigarettes. If you need help quitting, ask your provider. Avoid caffeine, alcohol, and certain over-the-counter cold medicines. These may make you feel worse. Ask your pharmacist which medicines to avoid. General instructions Take over-the-counter and prescription medicines only as told by your provider. Keep all follow-up visits. This is to make sure you are managing your anxiety well or if you need more support. Where to find support You can get help and support from: Self-help groups. Online and [employer]. A trusted spiritual leader. Couples counseling. Family education classes. Family therapy. Where to find more information You may find that joining a support group helps you deal with your anxiety. The following sources can help you find counselors or support groups near you: Mental Health [place]: mentalhealthamerica.net Anxiety and Depression Association of [place] (ADAA): adaa.org The [employer] on Mental Illness (NAMI): nami.org Contact  a health care provider if: You have a hard time staying focused or finishing tasks. You spend many hours a day feeling worried about everyday life. You are very tired because you cannot stop worrying. You start to have headaches or often feel  tense. You have chronic nausea or diarrhea. Get help right away if: Your heart feels like it is racing. You have shortness of breath. You have thoughts of hurting yourself or others. Get help right away if you feel like you may hurt yourself or others, or have thoughts about taking your own life. Go to your nearest emergency room or: Call 911. Call the National Suicide Prevention Lifeline at 786-339-1076 or 988. This is open 24 hours a day. Text the Crisis Text Line at (707)642-5675. This information is not intended to replace advice given to you by your health care provider. Make sure you discuss any questions you have with your health care provider. Document Revised: 10/15/2021 Document Reviewed: 04/29/2020 Elsevier Patient Education  2024 [employer]    Edwina Barth, MD [facility]

## 2022-06-25 DIAGNOSIS — E1169 Type 2 diabetes mellitus with other specified complication: Secondary | ICD-10-CM | POA: Diagnosis not present

## 2022-06-25 DIAGNOSIS — Z794 Long term (current) use of insulin: Secondary | ICD-10-CM | POA: Diagnosis not present

## 2022-06-26 ENCOUNTER — Ambulatory Visit (INDEPENDENT_AMBULATORY_CARE_PROVIDER_SITE_OTHER): Payer: BC Managed Care – PPO | Admitting: "Endocrinology

## 2022-06-26 ENCOUNTER — Encounter: Payer: Self-pay | Admitting: "Endocrinology

## 2022-06-26 VITALS — BP 106/64 | HR 72 | Ht 66.0 in | Wt 168.6 lb

## 2022-06-26 DIAGNOSIS — Z7984 Long term (current) use of oral hypoglycemic drugs: Secondary | ICD-10-CM | POA: Insufficient documentation

## 2022-06-26 DIAGNOSIS — E782 Mixed hyperlipidemia: Secondary | ICD-10-CM

## 2022-06-26 DIAGNOSIS — E1169 Type 2 diabetes mellitus with other specified complication: Secondary | ICD-10-CM | POA: Diagnosis not present

## 2022-06-26 DIAGNOSIS — I10 Essential (primary) hypertension: Secondary | ICD-10-CM

## 2022-06-26 DIAGNOSIS — Z794 Long term (current) use of insulin: Secondary | ICD-10-CM | POA: Diagnosis not present

## 2022-06-26 LAB — COMPREHENSIVE METABOLIC PANEL WITH GFR
ALT: 11 IU/L (ref 0–32)
AST: 12 IU/L (ref 0–40)
Albumin/Globulin Ratio: 1.9 (ref 1.2–2.2)
Albumin: 4.2 g/dL (ref 3.9–4.9)
Alkaline Phosphatase: 85 IU/L (ref 44–121)
BUN/Creatinine Ratio: 30 — ABNORMAL HIGH (ref 12–28)
BUN: 21 mg/dL (ref 8–27)
Bilirubin Total: <0.2 mg/dL (ref 0.0–1.2)
CO2: 26 mmol/L (ref 20–29)
Calcium: 9.3 mg/dL (ref 8.7–10.3)
Chloride: 101 mmol/L (ref 96–106)
Creatinine, Ser: 0.7 mg/dL (ref 0.57–1.00)
Globulin, Total: 2.2 g/dL (ref 1.5–4.5)
Glucose: 136 mg/dL — ABNORMAL HIGH (ref 70–99)
Potassium: 4.8 mmol/L (ref 3.5–5.2)
Sodium: 139 mmol/L (ref 134–144)
Total Protein: 6.4 g/dL (ref 6.0–8.5)
eGFR: 98 mL/min/1.73 (ref >59)

## 2022-06-26 LAB — LIPID PANEL
Chol/HDL Ratio: 3 ratio (ref 0.0–4.4)
Cholesterol, Total: 139 mg/dL (ref 100–199)
HDL: 47 mg/dL (ref >39)
LDL Chol Calc (NIH): 82 mg/dL (ref 0–99)
Triglycerides: 44 mg/dL (ref 0–149)
VLDL Cholesterol Cal: 10 mg/dL (ref 5–40)

## 2022-06-26 LAB — POCT GLYCOSYLATED HEMOGLOBIN (HGB A1C): HbA1c, POC (controlled diabetic range): 6.4 % (ref 0.0–7.0)

## 2022-06-26 LAB — VITAMIN D 25 HYDROXY (VIT D DEFICIENCY, FRACTURES): Vit D, 25-Hydroxy: 64.9 ng/mL (ref 30.0–100.0)

## 2022-06-26 LAB — T4, FREE: Free T4: 1.02 ng/dL (ref 0.82–1.77)

## 2022-06-26 LAB — TSH: TSH: 2.38 u[IU]/mL (ref 0.450–4.500)

## 2022-06-26 LAB — VITAMIN B12: Vitamin B-12: 577 pg/mL (ref 232–1245)

## 2022-06-26 NOTE — Patient Instructions (Signed)

## 2022-06-26 NOTE — Progress Notes (Signed)
Endocrinology Consult Note       06/26/2022, 5:46 PM   Subjective:    Patient ID: [name], female    DOB: 1960-11-01.  [name] is being seen in consultation for management of currently uncontrolled symptomatic diabetes requested by  Gina Grandchild, MD.   Past Medical History:  Diagnosis Date   Depression    Diabetes mellitus without complication (HCC)    GERD (gastroesophageal reflux disease)    Hypertension    Sleep apnea     Past Surgical History:  Procedure Laterality Date   COLONOSCOPY  2013   hypo plastic polyps at [facility] in [place]   COLONOSCOPY WITH ESOPHAGOGASTRODUODENOSCOPY (EGD)  11/11/2021   DIAGNOSTIC LAPAROSCOPY     DILATION AND CURETTAGE OF UTERUS N/A 05/09/2020   Procedure: CERVICAL DILATATION AND ENDOCERVICAL CURETTAGE;  Surgeon: Steva Ready, DO;  Location: [facility];  Service: Gynecology;  Laterality: N/A;   LABIOPLASTY Left 08/10/2017   Procedure: LABIAPLASTY REVISION;  Surgeon: Geryl Rankins, MD;  Location:  SURGERY CENTER;  Service: Gynecology;  Laterality: Left;  Labiaplasty revision   UPPER GASTROINTESTINAL ENDOSCOPY      Social History   Socioeconomic History   Marital status: Married    Spouse name: Justin Mend "[name]"   Number of children: 0   Years of education: Not on file   Highest education level: Not on file  Occupational History   Occupation: accountent  Tobacco Use   Smoking status: Former    Years: 10    Types: Cigarettes   Smokeless tobacco: Never   Tobacco comments:    Not every day smoker in the past  Vaping Use   Vaping Use: Never used  Substance and Sexual Activity   Alcohol use: Yes    Comment: rarely about 2 drinks per month   Drug use: Never   Sexual activity: Not Currently    Birth control/protection: Post-menopausal  Other Topics Concern   Not on file  Social History Narrative   Right  handed    Lives in a one story home    Social Determinants of Health   Financial Resource Strain: Not on file  Food Insecurity: Not on file  Transportation Needs: Not on file  Physical Activity: Not on file  Stress: Not on file  Social Connections: Not on file    Family History  Problem Relation Age of Onset   Dementia Mother    Alzheimer's disease Mother    Hypertension Mother    Rheumatic fever Father    Bipolar disorder Sister    Diabetes Sister    High Cholesterol Sister    Bipolar disorder Brother    Depression Maternal Grandmother    Diabetes Maternal Grandfather    Pancreatic cancer Paternal Grandfather    Colon cancer Neg Hx    Colon polyps Neg Hx    Esophageal cancer Neg Hx    Rectal cancer Neg Hx    Stomach cancer Neg Hx     Outpatient Encounter Medications as of 06/26/2022  Medication Sig   ALPRAZolam (XANAX) 0.5 MG tablet Take 1 tablet by mouth 2 times daily  as needed for anxiety.   atorvastatin (LIPITOR) 10 MG tablet TAKE 1 TABLET BY MOUTH EVERY DAY   buPROPion (WELLBUTRIN XL) 300 MG 24 hr tablet Take 1 tablet (300 mg total) by mouth daily.   citalopram (CELEXA) 40 MG tablet Take 1 tablet (40 mg total) by mouth daily.   clindamycin (CLEOCIN) 300 MG capsule Take 1 capsule (300 mg total) by mouth every 8 (eight) hours.   co-enzyme Q-10 30 MG capsule Take 30 mg by mouth daily.   Continuous Glucose Receiver (DEXCOM G7 RECEIVER) DEVI Use as directed.   Continuous Glucose Sensor (DEXCOM G7 SENSOR) MISC Attach to skin as directed. Replace every 10 days.   Cyanocobalamin (VITAMIN B12) 1000 MCG TBCR Take by mouth.   dapagliflozin propanediol (FARXIGA) 10 MG TABS tablet Take 1 tablet (10 mg total) by mouth daily before breakfast.   Insulin Pen Needle (TECHLITE PEN NEEDLES) 32G X 6 MM MISC Use as directed with Insulin   Lactobacillus (PROBIOTIC ACIDOPHILUS PO) Take 1 tablet by mouth daily.   loratadine (CLARITIN) 10 MG tablet    OVER THE COUNTER MEDICATION GI Microb-X  1 tablet daily   OVER THE COUNTER MEDICATION Fodmate 1-2 capsules with each meal   pantoprazole (PROTONIX) 40 MG tablet Take 1 tablet (40 mg total) by mouth daily.   ramipril (ALTACE) 2.5 MG capsule Take 1 capsule (2.5 mg total) by mouth daily.   VITAMIN D PO Take 5,000 Units by mouth.   No facility-administered encounter medications on file as of 06/26/2022.    ALLERGIES: Allergies  Allergen Reactions   Flagyl [Metronidazole] Diarrhea   Metformin And Related Shortness Of Breath    VACCINATION STATUS: Immunization History  Administered Date(s) Administered   Influenza,inj,Quad PF,6+ Mos 10/08/2021   PFIZER Comirnaty(Gray Top)Covid-19 Tri-Sucrose Vaccine 05/28/2020   PFIZER(Purple Top)SARS-COV-2 Vaccination 03/30/2019, 04/20/2019, 11/23/2019   PNEUMOCOCCAL CONJUGATE-20 06/11/2020   Tdap 10/06/2008, 12/20/2018   Zoster Recombinat (Shingrix) 06/11/2020, 09/10/2020    Diabetes She presents for her follow-up diabetic visit. She has type 2 diabetes mellitus. Onset time: She was diagnosed at approximate age of 62 years. Her disease course has been stable. There are no hypoglycemic associated symptoms. Pertinent negatives for hypoglycemia include no confusion, headaches, pallor or seizures. There are no diabetic associated symptoms. Pertinent negatives for diabetes include no chest pain, no polydipsia, no polyphagia and no polyuria. There are no hypoglycemic complications. Symptoms are stable. There are no diabetic complications. Risk factors for coronary artery disease include dyslipidemia, diabetes mellitus, post-menopausal and tobacco exposure. Current diabetic treatments: She is currently on Tresiba 10 units daily, Farxiga 10 g p.o. daily. Her weight is stable. She is following a generally unhealthy diet. When asked about meal planning, she reported none. She has not had a previous visit with a dietitian. She participates in exercise intermittently. Her home blood glucose trend is fluctuating  minimally. (She drinks her Dexcom CGM device showing 99% time in range.  No hypoglycemia.  Her point-of-care A1c is 6.4% improving from 7.2% in August 2023.  Her average blood glucose is 128 over the last 14 days. ) An ACE inhibitor/angiotensin II receptor blocker is being taken.  Hyperlipidemia This is a chronic problem. The current episode started more than 1 year ago. The problem is controlled. Exacerbating diseases include diabetes. Pertinent negatives include no chest pain, myalgias or shortness of breath. Current antihyperlipidemic treatment includes statins. Risk factors for coronary artery disease include diabetes mellitus, dyslipidemia, post-menopausal and family history.     Review of Systems  Constitutional:  Negative for chills, fever and unexpected weight change.  HENT:  Negative for trouble swallowing and voice change.   Eyes:  Negative for visual disturbance.  Respiratory:  Negative for cough, shortness of breath and wheezing.   Cardiovascular:  Negative for chest pain, palpitations and leg swelling.  Gastrointestinal:  Negative for diarrhea, nausea and vomiting.  Endocrine: Negative for cold intolerance, heat intolerance, polydipsia, polyphagia and polyuria.  Musculoskeletal:  Negative for arthralgias and myalgias.  Skin:  Negative for color change, pallor, rash and wound.  Neurological:  Negative for seizures and headaches.  Psychiatric/Behavioral:  Negative for confusion and suicidal ideas.     Objective:       06/26/2022    1:52 PM 06/19/2022    9:23 AM 04/01/2022    8:49 AM  Vitals with BMI  Height 5\' 6"  5\' 6"  5\' 6"   Weight 168 lbs 10 oz 164 lbs 8 oz 168 lbs  BMI 27.23 26.56 27.13  Systolic 106 118 161  Diastolic 64 72 68  Pulse 72 67 72    BP 106/64   Pulse 72   Ht 5\' 6"  (1.676 m)   Wt 168 lb 9.6 oz (76.5 kg)   LMP 02/02/2013   BMI 27.21 kg/m   Wt Readings from Last 3 Encounters:  06/26/22 168 lb 9.6 oz (76.5 kg)  06/19/22 164 lb 8 oz (74.6 kg)   04/01/22 168 lb (76.2 kg)       CMP ( most recent) CMP     Component Value Date/Time   NA 139 06/25/2022 0850   K 4.8 06/25/2022 0850   CL 101 06/25/2022 0850   CO2 26 06/25/2022 0850   GLUCOSE 136 (H) 06/25/2022 0850   GLUCOSE 90 10/08/2021 0928   BUN 21 06/25/2022 0850   CREATININE 0.70 06/25/2022 0850   CALCIUM 9.3 06/25/2022 0850   PROT 6.4 06/25/2022 0850   ALBUMIN 4.2 06/25/2022 0850   AST 12 06/25/2022 0850   ALT 11 06/25/2022 0850   ALKPHOS 85 06/25/2022 0850   BILITOT <0.2 06/25/2022 0850   GFRNONAA >60 05/07/2020 1430   GFRAA >60 08/06/2017 1924     Diabetic Labs (most recent): Lab Results  Component Value Date   HGBA1C 6.4 06/26/2022   HGBA1C 6.2 03/25/2022   HGBA1C 7.2 (H) 08/27/2021   MICROALBUR <0.7 08/21/2021   MICROALBUR 0.9 09/10/2020     Lipid Panel ( most recent) Lipid Panel     Component Value Date/Time   CHOL 139 06/25/2022 0850   TRIG 44 06/25/2022 0850   HDL 47 06/25/2022 0850   CHOLHDL 3.0 06/25/2022 0850   CHOLHDL 3 02/07/2021 1618   VLDL 16.6 02/07/2021 1618   LDLCALC 82 06/25/2022 0850   LABVLDL 10 06/25/2022 0850          Assessment & Plan:   1. Type 2 diabetes mellitus with other specified complication, with long-term current use of insulin (HCC) - [name] has currently uncontrolled symptomatic type 2 DM since  62 years of age.  She drinks her Dexcom CGM device showing 99% time in range.  No hypoglycemia.  Her point-of-care A1c is 6.4% improving from 7.2% in August 2023.   Recent labs reviewed. - I had a long discussion with her about the possible risk factors and  the pathology behind its diabetes and its complications. -her diabetes is complicated by comorbid hyperlipidemia and she remains at a high risk for more acute and chronic complications which include CAD, CVA, CKD,  retinopathy, and neuropathy. These are all discussed in detail with her.  - I discussed all available options of managing her diabetes  including de-escalation of medications. I have counseled her on Food as Medicine by adopting a Whole Food , Plant Predominant  ( WFPP) nutrition as recommended by [employer] of Lifestyle Medicine. Patient is encouraged to switch to  unprocessed or minimally processed  complex starch, adequate protein intake (mainly plant source), minimal liquid fat, plenty of fruits, and vegetables. -  she is advised to stick to a routine mealtimes to eat 3 complete meals a day and snack only when necessary ( to snack only to correct hypoglycemia BG <70 day time or <100 at night).   - she acknowledges that there is a room for improvement in her food and drink choices. - Suggestion is made for her to avoid simple carbohydrates  from her diet including Cakes, Sweet Desserts, Ice Cream, Soda (diet and regular), Sweet Tea, Candies, Chips, Cookies, Store Bought Juices, Alcohol , Artificial Sweeteners,  Coffee Creamer, and "Sugar-free" Products, Lemonade. This will help patient to have more stable blood glucose profile and potentially avoid unintended weight gain.  The following Lifestyle Medicine recommendations according to American College of Lifestyle Medicine  [facility]) were discussed and and offered to patient and she  agrees to start the journey:  A. Whole Foods, Plant-Based Nutrition comprising of fruits and vegetables, plant-based proteins, whole-grain carbohydrates was discussed in detail with the patient.   A list for source of those nutrients were also provided to the patient.  Patient will use only water or unsweetened tea for hydration. B.  The need to stay away from risky substances including alcohol, smoking; obtaining 7 to 9 hours of restorative sleep, at least 150 minutes of moderate intensity exercise weekly, the importance of healthy social connections,  and stress management techniques were discussed. C.  A full color page of  Calorie density of various food groups per pound showing examples of each food  groups was provided to the patient.    - I have approached her with the following individualized plan to manage  her diabetes and patient agrees:   - she presents with controlled glycemic profile, will not need additional intervention with medications.  She will continue to benefit from [place].  She is advised to continue Farxiga 10 mg p.o. daily at breakfast. This patient has a chance to reverse her diabetes.  She is encouraged to continue monitoring blood glucose using her Dexcom.  - she is encouraged to call clinic for blood glucose levels less than 70 or above 200 mg /dl. She will be considered for low-dose GLP-1 receptor agonists on subsequent visits if necessary.  - Specific targets for  A1c;  LDL, HDL,  and Triglycerides were discussed with the patient.  2) Blood Pressure /Hypertension: Her blood pressure is controlled to target.  She also had a chance to come off of medications, currently only ramipril 2.5 mg p.o. daily at breakfast.    3) Lipids/Hyperlipidemia:   Review of her recent lipid panel showed  controlled  LDL improving at 82.  She is advised to continue Lipitor 10 mg p.o. nightly.  .  Side effects and precautions discussed with her.  4)  Weight/Diet:  Body mass index is 27.21 kg/m.  -    she is  a candidate for weight loss. I discussed with her the fact that loss of 5 - 10% of her  current body weight will have the most  impact on her diabetes management.  The above detailed  ACLM recommendations for nutrition, exercise, sleep, social life, avoidance of risky substances, the need for restorative sleep   information will also detailed on discharge instructions.  5) Chronic Care/Health Maintenance:  -she  is on ACEI/ARB and Statin medications and  is encouraged to initiate and continue to follow up with Ophthalmology, Dentist,  Podiatrist at least yearly or according to recommendations, and advised to   stay away from smoking. I have recommended yearly flu vaccine and  pneumonia vaccine at least every 5 years; moderate intensity exercise for up to 150 minutes weekly; and  sleep for 7- 9 hours a day.  She gives prior history of osteoporosis, not currently on treatment.  We will attempt to obtain a copy of her recent DEXA scan results.  - she is  advised to maintain close follow up with Gina Grandchild, MD for primary care needs, as well as her other providers for optimal and coordinated care.   I spent  26  minutes in the care of the patient today including review of labs from CMP, Lipids, Thyroid Function, Hematology (current and previous including abstractions from other facilities); face-to-face time discussing  her blood glucose readings/logs, discussing hypoglycemia and hyperglycemia episodes and symptoms, medications doses, her options of short and long term treatment based on the latest standards of care / guidelines;  discussion about incorporating lifestyle medicine;  and documenting the encounter. Risk reduction counseling performed per USPSTF guidelines to reduce  cardiovascular risk factors.     Please refer to Patient Instructions for Blood Glucose Monitoring and Insulin/Medications Dosing Guide"  in media tab for additional information. Please  also refer to " Patient Self Inventory" in the Media  tab for reviewed elements of pertinent patient history.  [name] participated in the discussions, expressed understanding, and voiced agreement with the above plans.  All questions were answered to her satisfaction. she is encouraged to contact clinic should she have any questions or concerns prior to her return visit.   Follow up plan: - Return in about 6 months (around 12/26/2022) for Bring Meter/CGM Device/Logs- A1c in Office.  Marquis Lunch, MD [facility] [address] [place], [place] [address] Phone: [phone]  Fax: [phone]    06/26/2022, 5:46 PM  This note was partially dictated  with voice recognition software. Similar sounding words can be transcribed inadequately or may not  be corrected upon review.

## 2022-06-30 ENCOUNTER — Other Ambulatory Visit (HOSPITAL_COMMUNITY): Payer: Self-pay

## 2022-06-30 ENCOUNTER — Encounter: Payer: Self-pay | Admitting: Neurology

## 2022-06-30 ENCOUNTER — Ambulatory Visit (INDEPENDENT_AMBULATORY_CARE_PROVIDER_SITE_OTHER): Payer: BC Managed Care – PPO | Admitting: Neurology

## 2022-06-30 ENCOUNTER — Other Ambulatory Visit: Payer: Self-pay

## 2022-06-30 VITALS — BP 121/69 | HR 68 | Ht 66.0 in | Wt 168.0 lb

## 2022-06-30 DIAGNOSIS — F4321 Adjustment disorder with depressed mood: Secondary | ICD-10-CM

## 2022-06-30 DIAGNOSIS — E1142 Type 2 diabetes mellitus with diabetic polyneuropathy: Secondary | ICD-10-CM

## 2022-06-30 DIAGNOSIS — R202 Paresthesia of skin: Secondary | ICD-10-CM | POA: Diagnosis not present

## 2022-06-30 NOTE — Progress Notes (Signed)
Follow-up Visit   Date: 06/30/2022    [name] MRN: [id number] DOB: 06-24-1960    [name] is a 62 y.o. right-handed Caucasian female with diabetes mellitus, hyperlipidemia, depression, and hypertension returning to the clinic for follow-up of left foot weakness.  The patient was accompanied to the clinic by self.  IMPRESSION/PLAN: Left foot drop, resolved.  Symptoms most likely due to transient compression of peroneal nerve. Fortunately, symptoms quickly improved within 3 hours.  She did not have left face or arm involvement, so doubt TIA.   Diabetic polyneuropathy manifesting with burning paresthesias and distal numbness, stable.   Generalized paresthesias of the arms and legs are most likely stress manifestation.  Much of today's visit was spent with grief counseling. She is managing all her husband's affairs and trying to navigate all the things to needs to do.   Return to clinic in 6 months  --------------------------------------------- UPDATE 12/27/2021:  About a month ago, she had one spell of having weakness in her left foot where she was unable to raise her left foot at the ankle or extend her toes. Symptoms lasted 3 hours.  Prior to this, she was sitting eating lunch for about 15 minutes and noticed symptoms when she returned to work and was walking in the parking lot.  She recalls having to raise the left leg to clear the ground.  She had associated numbness of the foot.  There was no weakness/numbness involving the face or arm.  No associated back pain or shooting.   This has not recurred.  She does cross her legs, but does not remember if she was sitting in this position.  She also complains of burning and tingling in the forearm.    UPDATE 06/30/2022:  She is here for 21-month follow-up.  She has been having a rough few months after her husband passed away suddenly in 04-28-2022.  She reports that he has a lot of things and hoarded for more than 10 years with two  home, vehicles, and other items that she is trying to get valued to sell.  She is very tearful at today's visit and expresses being overwhelmed.    She reports having new tingling over the arms and legs.  No weakness or imbalance.   Medications:  Current Outpatient Medications on File Prior to Visit  Medication Sig Dispense Refill   ALPRAZolam (XANAX) 0.5 MG tablet Take 1 tablet by mouth 2 times daily as needed for anxiety. 30 tablet 1   atorvastatin (LIPITOR) 10 MG tablet TAKE 1 TABLET BY MOUTH EVERY DAY 90 tablet 1   buPROPion (WELLBUTRIN XL) 300 MG 24 hr tablet Take 1 tablet (300 mg total) by mouth daily. 90 tablet 1   citalopram (CELEXA) 40 MG tablet Take 1 tablet (40 mg total) by mouth daily. 90 tablet 0   co-enzyme Q-10 30 MG capsule Take 30 mg by mouth daily.     Continuous Glucose Receiver (DEXCOM G7 RECEIVER) DEVI Use as directed. 9 each 1   Continuous Glucose Sensor (DEXCOM G7 SENSOR) MISC Attach to skin as directed. Replace every 10 days. 3 each 3   Cyanocobalamin (VITAMIN B12) 1000 MCG TBCR Take by mouth.     dapagliflozin propanediol (FARXIGA) 10 MG TABS tablet Take 1 tablet (10 mg total) by mouth daily before breakfast. 90 tablet 0   Insulin Pen Needle (TECHLITE PEN NEEDLES) 32G X 6 MM MISC Use as directed with Insulin 100 each 0   Lactobacillus (PROBIOTIC ACIDOPHILUS  PO) Take 1 tablet by mouth daily.     loratadine (CLARITIN) 10 MG tablet      OVER THE COUNTER MEDICATION GI Microb-X 1 tablet daily     OVER THE COUNTER MEDICATION Fodmate 1-2 capsules with each meal     pantoprazole (PROTONIX) 40 MG tablet Take 1 tablet (40 mg total) by mouth daily. 90 tablet 0   ramipril (ALTACE) 2.5 MG capsule Take 1 capsule (2.5 mg total) by mouth daily. 90 capsule 1   VITAMIN D PO Take 5,000 Units by mouth.     No current facility-administered medications on file prior to visit.    Allergies:  Allergies  Allergen Reactions   Flagyl [Metronidazole] Diarrhea   Metformin And Related  Shortness Of Breath    Vital Signs:  BP 121/69   Pulse 68   Ht 5\' 6"  (1.676 m)   Wt 168 lb (76.2 kg)   LMP 02/02/2013   SpO2 98%   BMI 27.12 kg/m   Neurological Exam: MENTAL STATUS including orientation to time, place, person, recent and remote memory, attention span and concentration, language, and fund of knowledge is normal.  Speech is not dysarthric.  CRANIAL NERVES:  Pupils equal round and reactive to light.  Normal conjugate, extra-ocular eye movements in all directions of gaze.  No ptosis.  Face is symmetric.   MOTOR:  Motor strength is 5/5 in all extremities, including left foot dorsiflexion and toe extension.  No atrophy, fasciculations or abnormal movements.  MSRs:  Reflexes are 2+/4 throughout, except absent below the ankles.  SENSORY:  Vibration absent at the great toe, temperature reduced over the dorsum of the feet.  COORDINATION/GAIT:  Normal finger-to- nose-finger.  Intact rapid alternating movements bilaterally.  Gait narrow based and stable.    Data:  Lab Results  Component Value Date   HGBA1C 6.4 06/26/2022   Total time spent reviewing records, interview, history/exam, documentation, and coordination of care on day of encounter:  30 min   Thank you for allowing me to participate in patient's care.  If I can answer any additional questions, I would be pleased to do so.    Sincerely,    Holger Sokolowski K. Allena Katz, DO

## 2022-07-01 DIAGNOSIS — F4323 Adjustment disorder with mixed anxiety and depressed mood: Secondary | ICD-10-CM | POA: Diagnosis not present

## 2022-07-02 ENCOUNTER — Ambulatory Visit (INDEPENDENT_AMBULATORY_CARE_PROVIDER_SITE_OTHER): Payer: BC Managed Care – PPO | Admitting: Internal Medicine

## 2022-07-02 ENCOUNTER — Encounter: Payer: Self-pay | Admitting: Internal Medicine

## 2022-07-02 ENCOUNTER — Other Ambulatory Visit (HOSPITAL_COMMUNITY): Payer: Self-pay

## 2022-07-02 VITALS — BP 112/68 | HR 72 | Ht 66.0 in | Wt 163.0 lb

## 2022-07-02 DIAGNOSIS — R1013 Epigastric pain: Secondary | ICD-10-CM | POA: Diagnosis not present

## 2022-07-02 DIAGNOSIS — R140 Abdominal distension (gaseous): Secondary | ICD-10-CM | POA: Diagnosis not present

## 2022-07-02 DIAGNOSIS — K219 Gastro-esophageal reflux disease without esophagitis: Secondary | ICD-10-CM | POA: Diagnosis not present

## 2022-07-02 MED ORDER — DICYCLOMINE HCL 10 MG PO CAPS
10.0000 mg | ORAL_CAPSULE | Freq: Three times a day (TID) | ORAL | 1 refills | Status: DC | PRN
  Filled 2022-07-02: qty 90, 30d supply, fill #0

## 2022-07-02 MED ORDER — PANTOPRAZOLE SODIUM 20 MG PO TBEC
20.0000 mg | DELAYED_RELEASE_TABLET | Freq: Every day | ORAL | 1 refills | Status: DC
  Filled 2022-07-02: qty 90, 90d supply, fill #0
  Filled 2022-09-25: qty 90, 90d supply, fill #1

## 2022-07-02 NOTE — Patient Instructions (Addendum)
We have sent the following medications to your pharmacy for you to pick up at your convenience:Bentyl, Pantoprazole   Follow up in 6 months  If your blood pressure at your visit was 140/90 or greater, please contact your primary care physician to follow up on this.    If you are age 63 or older, your body mass index should be between 23-30. Your Body mass index is 26.31 kg/m. If this is out of the aforementioned range listed, please consider follow up with your Primary Care Provider.  If you are age 30 or younger, your body mass index should be between 19-25. Your Body mass index is 26.31 kg/m. If this is out of the aformentioned range listed, please consider follow up with your Primary Care Provider.    The [place] [facility] providers would like to encourage you to use [facility] to communicate with providers for non-urgent requests or questions.  Due to long hold times on the telephone, sending your provider a message by [facility] may be a faster and more efficient way to get a response.  Please allow 48 business hours for a response.  Please remember that this is for non-urgent requests.   Thank you for entrusting me with your care and for choosing [facility], Dr. Eulah Pont

## 2022-07-02 NOTE — Progress Notes (Signed)
07/02/2022 [name] [id number] 1960/04/18   HISTORY OF PRESENT ILLNESS: This is a 62 year old female presents for follow up of abdominal pain, GERD, and nausea.    Interval History: Her husband died in 31-May-2022 while he was working. His death was unexpected. It was suspected that he had a heart attack. Patient has been looking after her husband's two children. She does feel like she is currently under a lot of stress. She got her rifaximin within a few days of her last appt but did not end up taking it because she had a lot of other responsibilities going on. She is still having bloating and epigastric abdominal discomfort. The symptoms can be sporadic at times. She is still able to do her day-to-day activities despite her GI symptoms. Denies any chest burning or regurgitation  Wt Readings from Last 3 Encounters:  07/02/22 163 lb (73.9 kg)  06/30/22 168 lb (76.2 kg)  06/26/22 168 lb 9.6 oz (76.5 kg)    Outpatient Encounter Medications as of 07/02/2022  Medication Sig   ALPRAZolam (XANAX) 0.5 MG tablet Take 1 tablet by mouth 2 times daily as needed for anxiety.   atorvastatin (LIPITOR) 10 MG tablet TAKE 1 TABLET BY MOUTH EVERY DAY   buPROPion (WELLBUTRIN XL) 300 MG 24 hr tablet Take 1 tablet (300 mg total) by mouth daily.   citalopram (CELEXA) 40 MG tablet Take 1 tablet (40 mg total) by mouth daily.   co-enzyme Q-10 30 MG capsule Take 30 mg by mouth daily.   Continuous Glucose Receiver (DEXCOM G7 RECEIVER) DEVI Use as directed.   Continuous Glucose Sensor (DEXCOM G7 SENSOR) MISC Attach to skin as directed. Replace every 10 days.   Cyanocobalamin (VITAMIN B12) 1000 MCG TBCR Take by mouth.   dapagliflozin propanediol (FARXIGA) 10 MG TABS tablet Take 1 tablet (10 mg total) by mouth daily before breakfast.   Insulin Pen Needle (TECHLITE PEN NEEDLES) 32G X 6 MM MISC Use as directed with Insulin   Lactobacillus (PROBIOTIC ACIDOPHILUS PO) Take 1 tablet by mouth daily.   loratadine  (CLARITIN) 10 MG tablet    OVER THE COUNTER MEDICATION GI Microb-X 1 tablet daily   OVER THE COUNTER MEDICATION Fodmate 1-2 capsules with each meal   pantoprazole (PROTONIX) 40 MG tablet Take 1 tablet (40 mg total) by mouth daily.   ramipril (ALTACE) 2.5 MG capsule Take 1 capsule (2.5 mg total) by mouth daily.   VITAMIN D PO Take 5,000 Units by mouth.   [DISCONTINUED] clindamycin (CLEOCIN) 300 MG capsule Take 1 capsule (300 mg total) by mouth every 8 (eight) hours. (Patient not taking: Reported on 06/30/2022)   No facility-administered encounter medications on file as of 07/02/2022.   PHYSICAL EXAM: BP 112/68   Pulse 72   Ht 5\' 6"  (1.676 m)   Wt 163 lb (73.9 kg)   LMP 02/02/2013   BMI 26.31 kg/m  General: Well developed white female in no acute distress Head: Normocephalic and atraumatic Eyes:  Sclerae anicteric,conjunctive pink. Lungs: Clear throughout to auscultation; no W/R/R. Heart: Regular rate and rhythm; no M/R/G. Abdomen: Soft, non-distended.  BS present.  Non-tender. Musculoskeletal: Symmetrical with no gross deformities  Skin: No lesions on visible extremities Extremities: No edema  Neurological: Alert oriented x 4, grossly non-focal Psychological:  Alert and cooperative. Normal mood and affect  Labs 09/2021: CBC with mildly elevated platelets of 403. LFTs nml. Lipase nml. Amylase nml. ESR nml. CRP nml. Vit B12 and folate nml. Copper level nml.  Labs 01/2022: TTG IgA neg. TSH nml. Vit D nml. Iron/TIBC nml.   Labs 06/2022: CMP with mildly elevated glucose of 136. TSH nml. Vit D nml. Vit B12 nml. HbA1C 6.4%  Gastric emptying study 12/16/21: IMPRESSION: Normal gastric emptying study.  EGD 11/11/21: - Normal esophagus. - Four gastric polyps. Resected and retrieved. - Erythematous mucosa in the antrum. Biopsied. - Two duodenal polyps. Resected and retrieved. Path: 1. Surgical [P], duodenal polyp bx's BENIGN GASTRIC HETEROTOPIA 2. Surgical [P], gastric REACTIVE  GASTROPATHY WITH MINIMAL CHRONIC GASTRITIS NEGATIVE FOR H. PYLORI, INTESTINAL METAPLASIA, DYSPLASIA AND CARCINOMA 3. Surgical [P], gastric polyps, polyp (4) BENIGN FUNDIC GLAND POLYP MINIMAL CHRONIC GASTRITIS WITH REACTIVE EPITHELIAL CHANGES NEGATIVE FOR H. PYLORI, INTESTINAL METAPLASIA, DYSPLASIA AND CARCINOMA  Colonoscopy 11/11/21: - The examined portion of the ileum was normal. - Two 3 to 4 mm polyps in the transverse colon and in the ascending colon, removed with a cold snare. Resected and retrieved. - Non-bleeding internal hemorrhoids. Path: 4. Surgical [P], colon, ascending, transverse, polyp (2) TUBULAR ADENOMA NEGATIVE FOR HIGH-GRADE DYSPLASIA AND CARCINOMA  ASSESSMENT AND PLAN: Epigastric ab pain Nausea GERD History of gastritis History of SIBO History of colon polyps Patient has continued to have some of her abdominal discomfort but this has not been as bothersome as it has been in the past. She was given rifaximin in the past, which was very effective. I gave her a second course of rifaximin but after her husband passed away, she was distracted by other responsibilities. Will have her try Bentyl to see if this helps with her abdominal discomfort and patient can decide when she would like to use the rifaximin again in the future. Since patient's reflux is still under good control, will decrease her pantoprazole dosage.  - Previously educated on low FODMAP diet - Previously gave second course of rifaximin 550 mg TID for 14 days, which patient has not taken yet but will take in the future - Decrease from pantoprazole 40 mg QD to 20 mg QD - Can trial Bentyl 10 mg TID PRN for abdominal discomfort - Next colonoscopy is due in 10/2028 for polyp surveillance - RTC 6 months   I spent 34 minutes of time, including in depth chart review, independent review of results as outlined above, communicating results with the patient directly, face-to-face time with the patient, coordinating  care, ordering studies and medications as appropriate, and documentation.

## 2022-07-14 DIAGNOSIS — F4323 Adjustment disorder with mixed anxiety and depressed mood: Secondary | ICD-10-CM | POA: Diagnosis not present

## 2022-07-22 DIAGNOSIS — F4323 Adjustment disorder with mixed anxiety and depressed mood: Secondary | ICD-10-CM | POA: Diagnosis not present

## 2022-07-29 DIAGNOSIS — F4323 Adjustment disorder with mixed anxiety and depressed mood: Secondary | ICD-10-CM | POA: Diagnosis not present

## 2022-08-01 ENCOUNTER — Other Ambulatory Visit: Payer: Self-pay | Admitting: Internal Medicine

## 2022-08-01 ENCOUNTER — Other Ambulatory Visit (HOSPITAL_COMMUNITY): Payer: Self-pay

## 2022-08-01 DIAGNOSIS — F325 Major depressive disorder, single episode, in full remission: Secondary | ICD-10-CM

## 2022-08-02 ENCOUNTER — Other Ambulatory Visit (HOSPITAL_COMMUNITY): Payer: Self-pay

## 2022-08-05 ENCOUNTER — Other Ambulatory Visit (HOSPITAL_COMMUNITY): Payer: Self-pay

## 2022-08-05 DIAGNOSIS — Z713 Dietary counseling and surveillance: Secondary | ICD-10-CM | POA: Diagnosis not present

## 2022-08-06 ENCOUNTER — Telehealth: Payer: Self-pay | Admitting: Internal Medicine

## 2022-08-06 ENCOUNTER — Ambulatory Visit (INDEPENDENT_AMBULATORY_CARE_PROVIDER_SITE_OTHER): Payer: BC Managed Care – PPO | Admitting: Internal Medicine

## 2022-08-06 ENCOUNTER — Encounter: Payer: Self-pay | Admitting: Internal Medicine

## 2022-08-06 ENCOUNTER — Other Ambulatory Visit (HOSPITAL_COMMUNITY): Payer: Self-pay

## 2022-08-06 VITALS — BP 136/84 | HR 72 | Temp 98.1°F | Resp 16 | Ht 66.0 in | Wt 166.0 lb

## 2022-08-06 DIAGNOSIS — E785 Hyperlipidemia, unspecified: Secondary | ICD-10-CM

## 2022-08-06 DIAGNOSIS — E118 Type 2 diabetes mellitus with unspecified complications: Secondary | ICD-10-CM

## 2022-08-06 DIAGNOSIS — D75839 Thrombocytosis, unspecified: Secondary | ICD-10-CM | POA: Diagnosis not present

## 2022-08-06 DIAGNOSIS — I10 Essential (primary) hypertension: Secondary | ICD-10-CM | POA: Diagnosis not present

## 2022-08-06 DIAGNOSIS — F325 Major depressive disorder, single episode, in full remission: Secondary | ICD-10-CM

## 2022-08-06 DIAGNOSIS — R9431 Abnormal electrocardiogram [ECG] [EKG]: Secondary | ICD-10-CM

## 2022-08-06 DIAGNOSIS — Z0001 Encounter for general adult medical examination with abnormal findings: Secondary | ICD-10-CM | POA: Diagnosis not present

## 2022-08-06 DIAGNOSIS — F4323 Adjustment disorder with mixed anxiety and depressed mood: Secondary | ICD-10-CM | POA: Diagnosis not present

## 2022-08-06 LAB — CBC WITH DIFFERENTIAL/PLATELET
Basophils Absolute: 0 10*3/uL (ref 0.0–0.1)
Basophils Relative: 0.4 % (ref 0.0–3.0)
Eosinophils Absolute: 0.3 10*3/uL (ref 0.0–0.7)
Eosinophils Relative: 3.5 % (ref 0.0–5.0)
HCT: 41.2 % (ref 36.0–46.0)
Hemoglobin: 13.9 g/dL (ref 12.0–15.0)
Lymphocytes Relative: 35.1 % (ref 12.0–46.0)
Lymphs Abs: 2.6 10*3/uL (ref 0.7–4.0)
MCHC: 33.7 g/dL (ref 30.0–36.0)
MCV: 97 fl (ref 78.0–100.0)
Monocytes Absolute: 0.8 10*3/uL (ref 0.1–1.0)
Monocytes Relative: 10.5 % (ref 3.0–12.0)
Neutro Abs: 3.8 10*3/uL (ref 1.4–7.7)
Neutrophils Relative %: 50.5 % (ref 43.0–77.0)
Platelets: 370 10*3/uL (ref 150.0–400.0)
RBC: 4.25 Mil/uL (ref 3.87–5.11)
RDW: 13.1 % (ref 11.5–15.5)
WBC: 7.5 10*3/uL (ref 4.0–10.5)

## 2022-08-06 LAB — MICROALBUMIN / CREATININE URINE RATIO
Creatinine,U: 17.8 mg/dL
Microalb Creat Ratio: 3.9 mg/g (ref 0.0–30.0)
Microalb, Ur: <0.7 mg/dL (ref 0.0–1.9)

## 2022-08-06 MED ORDER — CITALOPRAM HYDROBROMIDE 40 MG PO TABS
40.0000 mg | ORAL_TABLET | Freq: Every day | ORAL | 1 refills | Status: DC
Start: 2022-08-06 — End: 2023-01-01
  Filled 2022-08-06 – 2022-08-07 (×2): qty 90, 90d supply, fill #0
  Filled 2022-12-05: qty 30, 30d supply, fill #1

## 2022-08-06 MED ORDER — ATORVASTATIN CALCIUM 10 MG PO TABS
10.0000 mg | ORAL_TABLET | Freq: Every day | ORAL | 1 refills | Status: DC
Start: 2022-08-06 — End: 2023-04-07
  Filled 2022-08-06: qty 90, 90d supply, fill #0
  Filled 2022-11-28: qty 90, 90d supply, fill #1

## 2022-08-06 NOTE — Progress Notes (Signed)
Subjective:  Patient ID: [name], female    DOB: 1960/09/01  Age: 62 y.o. MRN: [id number]  CC: Annual Exam, Hypertension, Diabetes, and Hyperlipidemia   HPI [name] presents for a CPX and F/up ----   Discussed the use of AI scribe software for clinical note transcription with the patient, who gave verbal consent to proceed.  History of Present Illness   The patient, recently widowed, presents with a chief complaint of a sensation described as a 'rush of adrenaline' that has been ongoing for several months and has recently worsened. This sensation, which is most noticeable upon waking, is described as a downward rush on both sides of the body. The patient denies any associated symptoms during physical activity, including fatigue, chest pain, shortness of breath, cold clammy sweats, or edema.  The patient has a history of diabetes, with a recent HbA1c of 6.2, and reports no significant blood sugar swings. She also denies symptoms of hypertension such as blurred vision but do note occasional minor headaches.   The patient's current medication regimen includes Xanax, Lipitor, Wellbutrin, and Citalopram. She expresses caution with Xanax use to avoid dependency.  The patient also reports a history of gastrointestinal issues, including small intestine bacterial overgrowth, which have improved following treatment. She continues to experience some stomach issues but note a significant improvement from previous symptoms.       Outpatient Medications Prior to Visit  Medication Sig Dispense Refill   ALPRAZolam (XANAX) 0.5 MG tablet Take 1 tablet by mouth 2 times daily as needed for anxiety. 30 tablet 1   buPROPion (WELLBUTRIN XL) 300 MG 24 hr tablet Take 1 tablet (300 mg total) by mouth daily. 90 tablet 1   co-enzyme Q-10 30 MG capsule Take 30 mg by mouth daily.     Continuous Glucose Receiver (DEXCOM G7 RECEIVER) DEVI Use as directed. 9 each 1   Continuous Glucose Sensor (DEXCOM  G7 SENSOR) MISC Attach to skin as directed. Replace every 10 days. 3 each 3   Cyanocobalamin (VITAMIN B12) 1000 MCG TBCR Take by mouth.     dapagliflozin propanediol (FARXIGA) 10 MG TABS tablet Take 1 tablet (10 mg total) by mouth daily before breakfast. 90 tablet 0   dicyclomine (BENTYL) 10 MG capsule Take 1 capsule (10 mg) by mouth 3 times daily as needed for spasms. 90 capsule 1   Lactobacillus (PROBIOTIC ACIDOPHILUS PO) Take 1 tablet by mouth daily.     loratadine (CLARITIN) 10 MG tablet      OVER THE COUNTER MEDICATION GI Microb-X 1 tablet daily     OVER THE COUNTER MEDICATION Fodmate 1-2 capsules with each meal     pantoprazole (PROTONIX) 20 MG tablet Take 1 tablet (20 mg) by mouth daily. 90 tablet 1   ramipril (ALTACE) 2.5 MG capsule Take 1 capsule (2.5 mg total) by mouth daily. 90 capsule 1   VITAMIN D PO Take 5,000 Units by mouth.     atorvastatin (LIPITOR) 10 MG tablet TAKE 1 TABLET BY MOUTH EVERY DAY 90 tablet 1   citalopram (CELEXA) 40 MG tablet Take 1 tablet (40 mg total) by mouth daily. 90 tablet 0   Insulin Pen Needle (TECHLITE PEN NEEDLES) 32G X 6 MM MISC Use as directed with Insulin 100 each 0   No facility-administered medications prior to visit.    ROS Review of Systems  Constitutional: Negative.  Negative for diaphoresis and fatigue.  HENT: Negative.    Eyes: Negative.   Respiratory: Negative.  Negative for cough, chest tightness, shortness of breath and wheezing.   Cardiovascular:  Negative for chest pain, palpitations and leg swelling.  Gastrointestinal:  Negative for abdominal pain, constipation, diarrhea and vomiting.  Endocrine: Negative.   Genitourinary: Negative.  Negative for difficulty urinating and dysuria.  Musculoskeletal: Negative.  Negative for arthralgias and myalgias.  Skin: Negative.   Neurological: Negative.  Negative for dizziness, weakness, light-headedness and headaches.  Hematological:  Negative for adenopathy. Does not bruise/bleed easily.   Psychiatric/Behavioral:  Positive for dysphoric mood. Negative for behavioral problems, confusion, self-injury and suicidal ideas. The patient is nervous/anxious.     Objective:  BP 136/84 (BP Location: Left Arm, Patient Position: Sitting, Cuff Size: Large)   Pulse 72   Temp 98.1 F (36.7 C) (Oral)   Resp 16   Ht 5\' 6"  (1.676 m)   Wt 166 lb (75.3 kg)   LMP 02/02/2013   SpO2 96%   BMI 26.79 kg/m   BP Readings from Last 3 Encounters:  08/13/22 136/84  07/02/22 112/68  06/30/22 121/69    Wt Readings from Last 3 Encounters:  08/06/22 166 lb (75.3 kg)  07/02/22 163 lb (73.9 kg)  06/30/22 168 lb (76.2 kg)    Physical Exam Vitals reviewed.  Constitutional:      Appearance: Normal appearance.  HENT:     Mouth/Throat:     Mouth: Mucous membranes are moist.  Eyes:     General: No scleral icterus.    Conjunctiva/sclera: Conjunctivae normal.  Cardiovascular:     Rate and Rhythm: Normal rate and regular rhythm.     Heart sounds: No murmur heard.    Comments: EKG- NSR, 68 bpm Septal infarct pattern is old No LVH or acute ST/T wave changes Pulmonary:     Effort: Pulmonary effort is normal.     Breath sounds: No stridor. No wheezing, rhonchi or rales.  Abdominal:     General: Abdomen is flat.     Palpations: There is no mass.     Tenderness: There is no abdominal tenderness. There is no guarding or rebound.     Hernia: No hernia is present.  Musculoskeletal:        General: Normal range of motion.     Cervical back: Neck supple.     Right lower leg: No edema.     Left lower leg: No edema.  Lymphadenopathy:     Cervical: No cervical adenopathy.  Skin:    General: Skin is warm and dry.  Neurological:     General: No focal deficit present.     Mental Status: She is alert. Mental status is at baseline.  Psychiatric:        Mood and Affect: Mood normal.        Behavior: Behavior normal.        Thought Content: Thought content normal.        Judgment: Judgment normal.      Lab Results  Component Value Date   WBC 7.5 08/06/2022   HGB 13.9 08/06/2022   HCT 41.2 08/06/2022   PLT 370.0 08/06/2022   GLUCOSE 136 (H) 06/25/2022   CHOL 139 06/25/2022   TRIG 44 06/25/2022   HDL 47 06/25/2022   LDLCALC 82 06/25/2022   ALT 11 06/25/2022   AST 12 06/25/2022   NA 139 06/25/2022   K 4.8 06/25/2022   CL 101 06/25/2022   CREATININE 0.70 06/25/2022   BUN 21 06/25/2022   CO2 26 06/25/2022   TSH 2.380 06/25/2022  INR 1.0 09/18/2006   HGBA1C 6.4 06/26/2022   MICROALBUR <0.7 08/06/2022    NM Gastric Emptying  Result Date: 12/16/2021 CLINICAL DATA:  Nausea vomiting abdominal pain.  Early satiety. EXAM: NUCLEAR MEDICINE GASTRIC EMPTYING SCAN TECHNIQUE: After oral ingestion of radiolabeled meal, sequential abdominal images were obtained for 120 minutes. Residual percentage of activity remaining within the stomach was calculated at 60 and 120 minutes. RADIOPHARMACEUTICALS:  2.2 mCi Tc-56m sulfur colloid in standardized meal COMPARISON:  CT abdomen pelvis October 31, 2021 FINDINGS: Expected location of the stomach in the left upper quadrant. Ingested meal empties the stomach gradually over the course of the study with 57% retention at 60 min and 7% retention at 120 min (normal retention less than 30% at a 120 min). IMPRESSION: Normal gastric emptying study. Electronically Signed   By: Maudry Mayhew M.D.   On: 12/16/2021 11:28    Assessment & Plan:   Primary hypertension- EKG is negative for LVH.  Her blood pressure is adequately well-controlled. -     EKG 12-Lead -     CBC with Differential/Platelet; Future -     Urinalysis, Routine w reflex microscopic; Future -     Microalbumin / creatinine urine ratio; Future  Thrombocytosis- Her platelets are normal now. -     CBC with Differential/Platelet; Future  Type II diabetes mellitus with manifestations (HCC) -     Urinalysis, Routine w reflex microscopic; Future -     Microalbumin / creatinine urine ratio;  Future -     HM Diabetes Foot Exam  Abnormal electrocardiogram (ECG) (EKG) -     CT CARDIAC SCORING ([facility] LOCATIONS ONLY); Future  Hyperlipidemia with target LDL less than 100- LDL goal achieved. Doing well on the statin  -     Atorvastatin Calcium; Take 1 tablet (10 mg total) by mouth daily.  Dispense: 90 tablet; Refill: 1  Major depression, single episode, in complete remission (HCC) -     Citalopram Hydrobromide; Take 1 tablet (40 mg total) by mouth daily.  Dispense: 90 tablet; Refill: 1  Encounter for general adult medical examination with abnormal findings- Exam completed, labs reviewed, vaccines are up-to-date, cancer screenings are up-to-date, patient education was given.     Follow-up: Return in about 6 months (around 02/06/2023).  Sanda Linger, MD

## 2022-08-06 NOTE — Patient Instructions (Signed)

## 2022-08-07 ENCOUNTER — Other Ambulatory Visit: Payer: Self-pay

## 2022-08-07 ENCOUNTER — Other Ambulatory Visit (HOSPITAL_COMMUNITY): Payer: Self-pay

## 2022-08-07 LAB — URINALYSIS, ROUTINE W REFLEX MICROSCOPIC
Bilirubin Urine: NEGATIVE
Hgb urine dipstick: NEGATIVE
Ketones, ur: NEGATIVE
Leukocytes,Ua: NEGATIVE
Nitrite: NEGATIVE
RBC / HPF: NONE SEEN (ref 0–?)
Specific Gravity, Urine: <=1.005 — AB (ref 1.000–1.030)
Total Protein, Urine: NEGATIVE
Urine Glucose: >=1000 — AB
Urobilinogen, UA: 0.2 (ref 0.0–1.0)
pH: 6 (ref 5.0–8.0)

## 2022-08-07 NOTE — Telephone Encounter (Signed)
Patient called and needs this sent today because she is going out of town tomorrow.

## 2022-08-07 NOTE — Telephone Encounter (Signed)
Pt informed citalopram was sent yday 7/17

## 2022-08-13 DIAGNOSIS — Z0001 Encounter for general adult medical examination with abnormal findings: Secondary | ICD-10-CM | POA: Insufficient documentation

## 2022-08-13 DIAGNOSIS — Z23 Encounter for immunization: Secondary | ICD-10-CM | POA: Insufficient documentation

## 2022-08-20 DIAGNOSIS — F4323 Adjustment disorder with mixed anxiety and depressed mood: Secondary | ICD-10-CM | POA: Diagnosis not present

## 2022-08-21 ENCOUNTER — Other Ambulatory Visit: Payer: Self-pay | Admitting: Internal Medicine

## 2022-08-21 DIAGNOSIS — I10 Essential (primary) hypertension: Secondary | ICD-10-CM

## 2022-08-21 DIAGNOSIS — E118 Type 2 diabetes mellitus with unspecified complications: Secondary | ICD-10-CM

## 2022-08-27 ENCOUNTER — Ambulatory Visit
Admission: RE | Admit: 2022-08-27 | Discharge: 2022-08-27 | Disposition: A | Discharge status: Home / Self Care | Payer: No Typology Code available for payment source | Source: Ambulatory Visit | Attending: Internal Medicine | Admitting: Internal Medicine

## 2022-08-27 DIAGNOSIS — R9431 Abnormal electrocardiogram [ECG] [EKG]: Secondary | ICD-10-CM

## 2022-08-29 DIAGNOSIS — Z01419 Encounter for gynecological examination (general) (routine) without abnormal findings: Secondary | ICD-10-CM | POA: Diagnosis not present

## 2022-08-29 DIAGNOSIS — Z124 Encounter for screening for malignant neoplasm of cervix: Secondary | ICD-10-CM | POA: Diagnosis not present

## 2022-08-30 ENCOUNTER — Other Ambulatory Visit (HOSPITAL_COMMUNITY): Payer: Self-pay

## 2022-08-30 ENCOUNTER — Other Ambulatory Visit: Payer: Self-pay | Admitting: Internal Medicine

## 2022-08-30 DIAGNOSIS — E1142 Type 2 diabetes mellitus with diabetic polyneuropathy: Secondary | ICD-10-CM

## 2022-08-30 DIAGNOSIS — E118 Type 2 diabetes mellitus with unspecified complications: Secondary | ICD-10-CM

## 2022-08-30 DIAGNOSIS — F325 Major depressive disorder, single episode, in full remission: Secondary | ICD-10-CM

## 2022-08-30 MED ORDER — DAPAGLIFLOZIN PROPANEDIOL 10 MG PO TABS
10.0000 mg | ORAL_TABLET | Freq: Every day | ORAL | 1 refills | Status: DC
Start: 2022-08-30 — End: 2023-06-04
  Filled 2022-08-30: qty 90, 90d supply, fill #0
  Filled 2022-11-28: qty 90, 90d supply, fill #1

## 2022-08-30 MED ORDER — BUPROPION HCL ER (XL) 300 MG PO TB24
300.0000 mg | ORAL_TABLET | Freq: Every day | ORAL | 1 refills | Status: DC
Start: 2022-08-30 — End: 2023-01-01
  Filled 2022-08-30: qty 90, 90d supply, fill #0
  Filled 2022-11-28: qty 90, 90d supply, fill #1

## 2022-09-01 ENCOUNTER — Other Ambulatory Visit: Payer: Self-pay

## 2022-09-02 DIAGNOSIS — Z713 Dietary counseling and surveillance: Secondary | ICD-10-CM | POA: Diagnosis not present

## 2022-09-04 ENCOUNTER — Other Ambulatory Visit (HOSPITAL_COMMUNITY): Payer: Self-pay

## 2022-09-04 DIAGNOSIS — Z713 Dietary counseling and surveillance: Secondary | ICD-10-CM | POA: Diagnosis not present

## 2022-09-10 DIAGNOSIS — F4323 Adjustment disorder with mixed anxiety and depressed mood: Secondary | ICD-10-CM | POA: Diagnosis not present

## 2022-09-23 ENCOUNTER — Other Ambulatory Visit: Payer: Self-pay | Admitting: "Endocrinology

## 2022-09-23 DIAGNOSIS — I10 Essential (primary) hypertension: Secondary | ICD-10-CM

## 2022-09-23 DIAGNOSIS — E118 Type 2 diabetes mellitus with unspecified complications: Secondary | ICD-10-CM

## 2022-09-24 ENCOUNTER — Other Ambulatory Visit (HOSPITAL_COMMUNITY): Payer: Self-pay

## 2022-09-24 MED ORDER — RAMIPRIL 2.5 MG PO CAPS
2.5000 mg | ORAL_CAPSULE | Freq: Every day | ORAL | 0 refills | Status: DC
  Filled 2022-09-24: qty 90, 90d supply, fill #0

## 2022-09-25 ENCOUNTER — Other Ambulatory Visit: Payer: Self-pay

## 2022-09-25 ENCOUNTER — Other Ambulatory Visit (HOSPITAL_COMMUNITY): Payer: Self-pay

## 2022-09-25 ENCOUNTER — Other Ambulatory Visit: Payer: Self-pay | Admitting: Internal Medicine

## 2022-09-25 MED ORDER — DEXCOM G7 SENSOR MISC
3 refills | Status: AC
  Filled 2022-09-25: qty 3, 30d supply, fill #0
  Filled 2022-11-28: qty 3, 30d supply, fill #1
  Filled 2023-03-24 (×2): qty 3, 30d supply, fill #2
  Filled 2023-09-09: qty 1, 10d supply, fill #2

## 2022-09-26 ENCOUNTER — Other Ambulatory Visit (HOSPITAL_COMMUNITY): Payer: Self-pay

## 2022-09-30 DIAGNOSIS — F4323 Adjustment disorder with mixed anxiety and depressed mood: Secondary | ICD-10-CM | POA: Diagnosis not present

## 2022-10-06 ENCOUNTER — Other Ambulatory Visit (HOSPITAL_COMMUNITY): Payer: Self-pay

## 2022-10-06 DIAGNOSIS — L03114 Cellulitis of left upper limb: Secondary | ICD-10-CM | POA: Insufficient documentation

## 2022-10-06 DIAGNOSIS — R110 Nausea: Secondary | ICD-10-CM | POA: Diagnosis not present

## 2022-10-06 DIAGNOSIS — M25532 Pain in left wrist: Secondary | ICD-10-CM | POA: Diagnosis not present

## 2022-10-06 DIAGNOSIS — M654 Radial styloid tenosynovitis [de Quervain]: Secondary | ICD-10-CM | POA: Insufficient documentation

## 2022-10-06 MED ORDER — ONDANSETRON 4 MG PO TBDP
ORAL_TABLET | ORAL | 0 refills | Status: DC
  Filled 2022-10-06: qty 9, 3d supply, fill #0

## 2022-10-06 MED ORDER — DOXYCYCLINE HYCLATE 100 MG PO TABS
ORAL_TABLET | ORAL | 0 refills | Status: DC
  Filled 2022-10-06: qty 14, 7d supply, fill #0

## 2022-10-08 DIAGNOSIS — L03114 Cellulitis of left upper limb: Secondary | ICD-10-CM | POA: Diagnosis not present

## 2022-10-13 ENCOUNTER — Other Ambulatory Visit (HOSPITAL_COMMUNITY): Payer: Self-pay

## 2022-10-13 DIAGNOSIS — L03114 Cellulitis of left upper limb: Secondary | ICD-10-CM | POA: Diagnosis not present

## 2022-10-13 DIAGNOSIS — M25532 Pain in left wrist: Secondary | ICD-10-CM | POA: Diagnosis not present

## 2022-10-13 MED ORDER — DOXYCYCLINE HYCLATE 100 MG PO CAPS
100.0000 mg | ORAL_CAPSULE | Freq: Two times a day (BID) | ORAL | 0 refills | Status: DC
  Filled 2022-10-13: qty 14, 7d supply, fill #0

## 2022-10-15 DIAGNOSIS — F4323 Adjustment disorder with mixed anxiety and depressed mood: Secondary | ICD-10-CM | POA: Diagnosis not present

## 2022-10-20 ENCOUNTER — Other Ambulatory Visit (HOSPITAL_COMMUNITY): Payer: Self-pay

## 2022-10-20 DIAGNOSIS — S60452A Superficial foreign body of right middle finger, initial encounter: Secondary | ICD-10-CM | POA: Diagnosis not present

## 2022-10-20 DIAGNOSIS — L03114 Cellulitis of left upper limb: Secondary | ICD-10-CM | POA: Diagnosis not present

## 2022-10-20 DIAGNOSIS — S60459A Superficial foreign body of unspecified finger, initial encounter: Secondary | ICD-10-CM | POA: Insufficient documentation

## 2022-10-22 ENCOUNTER — Encounter: Payer: Self-pay | Admitting: Neurology

## 2022-11-09 ENCOUNTER — Other Ambulatory Visit (HOSPITAL_COMMUNITY): Payer: Self-pay

## 2022-11-09 DIAGNOSIS — J069 Acute upper respiratory infection, unspecified: Secondary | ICD-10-CM | POA: Diagnosis not present

## 2022-11-09 DIAGNOSIS — J00 Acute nasopharyngitis [common cold]: Secondary | ICD-10-CM | POA: Diagnosis not present

## 2022-11-09 DIAGNOSIS — Z6826 Body mass index (BMI) 26.0-26.9, adult: Secondary | ICD-10-CM | POA: Diagnosis not present

## 2022-11-09 DIAGNOSIS — Z20822 Contact with and (suspected) exposure to covid-19: Secondary | ICD-10-CM | POA: Diagnosis not present

## 2022-11-10 ENCOUNTER — Other Ambulatory Visit (HOSPITAL_COMMUNITY): Payer: Self-pay

## 2022-11-10 ENCOUNTER — Telehealth: Payer: BC Managed Care – PPO | Admitting: Physician Assistant

## 2022-11-10 DIAGNOSIS — U071 COVID-19: Secondary | ICD-10-CM

## 2022-11-10 MED ORDER — NIRMATRELVIR/RITONAVIR (PAXLOVID)TABLET
3.0000 | ORAL_TABLET | Freq: Two times a day (BID) | ORAL | 0 refills | Status: AC
Start: 2022-11-10 — End: 2022-11-15
  Filled 2022-11-10: qty 30, 5d supply, fill #0

## 2022-11-10 MED ORDER — IPRATROPIUM BROMIDE 0.06 % NA SOLN
2.0000 | Freq: Three times a day (TID) | NASAL | 0 refills | Status: DC
  Filled 2022-11-10: qty 15, 25d supply, fill #0

## 2022-11-10 NOTE — Patient Instructions (Signed)
[name], thank you for joining [name], PA-C for today's virtual visit.  While this provider is not your primary care provider (PCP), if your PCP is located in our provider database this encounter information will be shared with them immediately following your visit.   A [facility] MyChart account gives you access to today's visit and all your visits, tests, and labs performed at [facility] " click here if you don't have a  MyChart account or go to [email or web address]  Consent: (Patient) [name] provided verbal consent for this virtual visit at the beginning of the encounter.  Current Medications:  Current Outpatient Medications:    nirmatrelvir/ritonavir (PAXLOVID) 20 x 150 MG & 10 x 100MG  TABS, Take 3 tablets by mouth 2 (two) times daily for 5 days. (Take nirmatrelvir 150 mg two tablets twice daily for 5 days and ritonavir 100 mg one tablet twice daily for 5 days) Patient GFR is 98, Disp: 30 tablet, Rfl: 0   ALPRAZolam (XANAX) 0.5 MG tablet, Take 1 tablet by mouth 2 times daily as needed for anxiety., Disp: 30 tablet, Rfl: 1   atorvastatin (LIPITOR) 10 MG tablet, Take 1 tablet (10 mg total) by mouth daily., Disp: 90 tablet, Rfl: 1   buPROPion (WELLBUTRIN XL) 300 MG 24 hr tablet, Take 1 tablet (300 mg total) by mouth daily., Disp: 90 tablet, Rfl: 1   citalopram (CELEXA) 40 MG tablet, Take 1 tablet (40 mg total) by mouth daily., Disp: 90 tablet, Rfl: 1   co-enzyme Q-10 30 MG capsule, Take 30 mg by mouth daily., Disp: , Rfl:    Continuous Glucose Receiver (DEXCOM G7 RECEIVER) DEVI, Use as directed., Disp: 9 each, Rfl: 1   Continuous Glucose Sensor (DEXCOM G7 SENSOR) MISC, Attach to skin as directed. Replace every 10 days., Disp: 3 each, Rfl: 3   Cyanocobalamin (VITAMIN B12) 1000 MCG TBCR, Take by mouth., Disp: , Rfl:    dapagliflozin propanediol (FARXIGA) 10 MG TABS tablet, Take 1 tablet (10 mg total) by mouth daily before breakfast.,  Disp: 90 tablet, Rfl: 1   dicyclomine (BENTYL) 10 MG capsule, Take 1 capsule (10 mg) by mouth 3 times daily as needed for spasms., Disp: 90 capsule, Rfl: 1   doxycycline (VIBRA-TABS) 100 MG tablet, Take 1 tablet (100 mg total) by mouth 2 (two) times a day for 7 days. Take with 8 oz water. Do not lie down for at least 30 minutes after., Disp: 14 tablet, Rfl: 0   doxycycline (VIBRAMYCIN) 100 MG capsule, Take 1 capsule (100 mg total) by mouth 2 (two) times daily for 7 days., Disp: 14 capsule, Rfl: 0   ipratropium (ATROVENT) 0.06 % nasal spray, Place 2 sprays into both nostrils 3 (three) times daily for 4 days., Disp: 15 mL, Rfl: 0   Lactobacillus (PROBIOTIC ACIDOPHILUS PO), Take 1 tablet by mouth daily., Disp: , Rfl:    loratadine (CLARITIN) 10 MG tablet, , Disp: , Rfl:    ondansetron (ZOFRAN-ODT) 4 MG disintegrating tablet, Dissolve 1 tablet (4 mg total) on tongue every 8 (eight) hours as needed for nausea or vomiting., Disp: 9 tablet, Rfl: 0   OVER THE COUNTER MEDICATION, GI Microb-X 1 tablet daily, Disp: , Rfl:    OVER THE COUNTER MEDICATION, Fodmate 1-2 capsules with each meal, Disp: , Rfl:    pantoprazole (PROTONIX) 20 MG tablet, Take 1 tablet (20 mg) by mouth daily., Disp: 90 tablet, Rfl: 1   ramipril (ALTACE) 2.5 MG capsule, Take 1 capsule (  2.5 mg total) by mouth daily., Disp: 90 capsule, Rfl: 0   ramipril (ALTACE) 5 MG capsule, TAKE 1 CAPSULE BY MOUTH EVERY DAY, Disp: 90 capsule, Rfl: 1   VITAMIN D PO, Take 5,000 Units by mouth., Disp: , Rfl:    Medications ordered in this encounter:  Meds ordered this encounter  Medications   nirmatrelvir/ritonavir (PAXLOVID) 20 x 150 MG & 10 x 100MG  TABS    Sig: Take 3 tablets by mouth 2 (two) times daily for 5 days. (Take nirmatrelvir 150 mg two tablets twice daily for 5 days and ritonavir 100 mg one tablet twice daily for 5 days) Patient GFR is 98    Dispense:  30 tablet    Refill:  0    Order Specific Question:   Supervising Provider    Answer:    Merrilee Jansky [id number]     *If you need refills on other medications prior to your next appointment, please contact your pharmacy*  Follow-Up: Call back or seek an in-person evaluation if the symptoms worsen or if the condition fails to improve as anticipated.  [facility] [phone]  Care Instructions: Can take to lessen severity: Vit C 500mg  twice daily Quercertin 250-500mg  twice daily Zinc 75-100mg  daily Melatonin 3-6 mg at bedtime Vit D3 1000-2000 IU daily Aspirin 81 mg daily with food Optional: Famotidine 20mg  daily Also can add tylenol/ibuprofen as needed for fevers and body aches May add Mucinex or Mucinex DM as needed for cough/congestion    Isolation Instructions: You are to isolate at home until you have been fever free for at least 24 hours without a fever-reducing medication, and symptoms have been steadily improving for 24 hours. At that time,  you can end isolation but need to mask for an additional 5 days.   If you must be around other household members who do not have symptoms, you need to make sure that both you and the family members are masking consistently with a high-quality mask.  If you note any worsening of symptoms despite treatment, please seek an in-person evaluation ASAP. If you note any significant shortness of breath or any chest pain, please seek ER evaluation. Please do not delay care!   COVID-19: What to Do if You Are Sick If you test positive and are an older adult or someone who is at high risk of getting very sick from COVID-19, treatment may be available. Contact a healthcare provider right away after a positive test to determine if you are eligible, even if your symptoms are mild right now. You can also visit a Test to Treat location and, if eligible, receive a prescription from a provider. Don't delay: Treatment must be started within the first few days to be effective. If you have a fever, cough, or other symptoms, you  might have COVID-19. Most people have mild illness and are able to recover at home. If you are sick: Keep track of your symptoms. If you have an emergency warning sign (including trouble breathing), call 911. Steps to help prevent the spread of COVID-19 if you are sick If you are sick with COVID-19 or think you might have COVID-19, follow the steps below to care for yourself and to help protect other people in your home and community. Stay home except to get medical care Stay home. Most people with COVID-19 have mild illness and can recover at home without medical care. Do not leave your home, except to get medical care. Do not visit public  areas and do not go to places where you are unable to wear a mask. Take care of yourself. Get rest and stay hydrated. Take over-the-counter medicines, such as acetaminophen, to help you feel better. Stay in touch with your doctor. Call before you get medical care. Be sure to get care if you have trouble breathing, or have any other emergency warning signs, or if you think it is an emergency. Avoid public transportation, ride-sharing, or taxis if possible. Get tested If you have symptoms of COVID-19, get tested. While waiting for test results, stay away from others, including staying apart from those living in your household. Get tested as soon as possible after your symptoms start. Treatments may be available for people with COVID-19 who are at risk for becoming very sick. Don't delay: Treatment must be started early to be effective--some treatments must begin within 5 days of your first symptoms. Contact your healthcare provider right away if your test result is positive to determine if you are eligible. Self-tests are one of several options for testing for the virus that causes COVID-19 and may be more convenient than laboratory-based tests and point-of-care tests. Ask your healthcare provider or your local health department if you need help interpreting your test  results. You can visit your state, tribal, local, and territorial health department's website to look for the latest local information on testing sites. Separate yourself from other people As much as possible, stay in a specific room and away from other people and pets in your home. If possible, you should use a separate bathroom. If you need to be around other people or animals in or outside of the home, wear a well-fitting mask. Tell your close contacts that they may have been exposed to COVID-19. An infected person can spread COVID-19 starting 48 hours (or 2 days) before the person has any symptoms or tests positive. By letting your close contacts know they may have been exposed to COVID-19, you are helping to protect everyone. See COVID-19 and Animals if you have questions about pets. If you are diagnosed with COVID-19, someone from the health department may call you. Answer the call to slow the spread. Monitor your symptoms Symptoms of COVID-19 include fever, cough, or other symptoms. Follow care instructions from your healthcare provider and local health department. Your local health authorities may give instructions on checking your symptoms and reporting information. When to seek emergency medical attention Look for emergency warning signs* for COVID-19. If someone is showing any of these signs, seek emergency medical care immediately: Trouble breathing Persistent pain or pressure in the chest New confusion Inability to wake or stay awake Pale, gray, or blue-colored skin, lips, or nail beds, depending on skin tone *This list is not all possible symptoms. Please call your medical provider for any other symptoms that are severe or concerning to you. Call 911 or call ahead to your local emergency facility: Notify the operator that you are seeking care for someone who has or may have COVID-19. Call ahead before visiting your doctor Call ahead. Many medical visits for routine care are being  postponed or done by phone or telemedicine. If you have a medical appointment that cannot be postponed, call your doctor's office, and tell them you have or may have COVID-19. This will help the office protect themselves and other patients. If you are sick, wear a well-fitting mask You should wear a mask if you must be around other people or animals, including pets (even at home). Wear a  mask with the best fit, protection, and comfort for you. You don't need to wear the mask if you are alone. If you can't put on a mask (because of trouble breathing, for example), cover your coughs and sneezes in some other way. Try to stay at least 6 feet away from other people. This will help protect the people around you. Masks should not be placed on young children under age 73 years, anyone who has trouble breathing, or anyone who is not able to remove the mask without help. Cover your coughs and sneezes Cover your mouth and nose with a tissue when you cough or sneeze. Throw away used tissues in a lined trash can. Immediately wash your hands with soap and water for at least 20 seconds. If soap and water are not available, clean your hands with an alcohol-based hand sanitizer that contains at least 60% alcohol. Clean your hands often Wash your hands often with soap and water for at least 20 seconds. This is especially important after blowing your nose, coughing, or sneezing; going to the bathroom; and before eating or preparing food. Use hand sanitizer if soap and water are not available. Use an alcohol-based hand sanitizer with at least 60% alcohol, covering all surfaces of your hands and rubbing them together until they feel dry. Soap and water are the best option, especially if hands are visibly dirty. Avoid touching your eyes, nose, and mouth with unwashed hands. Handwashing Tips Avoid sharing personal household items Do not share dishes, drinking glasses, cups, eating utensils, towels, or bedding with other  people in your home. Wash these items thoroughly after using them with soap and water or put in the dishwasher. Clean surfaces in your home regularly Clean and disinfect high-touch surfaces (for example, doorknobs, tables, handles, light switches, and countertops) in your "sick room" and bathroom. In shared spaces, you should clean and disinfect surfaces and items after each use by the person who is ill. If you are sick and cannot clean, a caregiver or other person should only clean and disinfect the area around you (such as your bedroom and bathroom) on an as needed basis. Your caregiver/other person should wait as long as possible (at least several hours) and wear a mask before entering, cleaning, and disinfecting shared spaces that you use. Clean and disinfect areas that may have blood, stool, or body fluids on them. Use household cleaners and disinfectants. Clean visible dirty surfaces with household cleaners containing soap or detergent. Then, use a household disinfectant. Use a product from [employer] List N: Disinfectants for Coronavirus (COVID-19). Be sure to follow the instructions on the label to ensure safe and effective use of the product. Many products recommend keeping the surface wet with a disinfectant for a certain period of time (look at "contact time" on the product label). You may also need to wear personal protective equipment, such as gloves, depending on the directions on the product label. Immediately after disinfecting, wash your hands with soap and water for 20 seconds. For completed guidance on cleaning and disinfecting your home, visit Complete Disinfection Guidance. Take steps to improve ventilation at home Improve ventilation (air flow) at home to help prevent from spreading COVID-19 to other people in your household. Clear out COVID-19 virus particles in the air by opening windows, using air filters, and turning on fans in your home. Use this interactive tool to learn how to  improve air flow in your home. When you can be around others after being sick with COVID-19  Deciding when you can be around others is different for different situations. Find out when you can safely end home isolation. For any additional questions about your care, contact your healthcare provider or state or local health department. 04/10/2020 Content source: [facility] ([facility]), Division of Viral Diseases This information is not intended to replace advice given to you by your health care provider. Make sure you discuss any questions you have with your health care provider. Document Revised: 05/24/2020 Document Reviewed: 05/24/2020 Elsevier Patient Education  2022 [employer].     If you have been instructed to have an in-person evaluation today at a local Urgent Care facility, please use the link below. It will take you to a list of all of our available [facility], including address, phone number and hours of operation. Please do not delay care.  [facility]  If you or a family member do not have a primary care provider, use the link below to schedule a visit and establish care. When you choose a [facility] primary care physician or advanced practice provider, you gain a long-term partner in health. Find a Primary Care Provider  Learn more about [facility] in-office and virtual care options: [facility] Get [facility] Now

## 2022-11-10 NOTE — Progress Notes (Signed)
Virtual Visit Consent   [name], you are scheduled for a virtual visit with a [facility] provider today. Just as with appointments in the office, your consent must be obtained to participate. Your consent will be active for this visit and any virtual visit you may have with one of our providers in the next 365 days. If you have a MyChart account, a copy of this consent can be sent to you electronically.  As this is a virtual visit, video technology does not allow for your provider to perform a traditional examination. This may limit your provider's ability to fully assess your condition. If your provider identifies any concerns that need to be evaluated in person or the need to arrange testing (such as labs, EKG, etc.), we will make arrangements to do so. Although advances in technology are sophisticated, we cannot ensure that it will always work on either your end or our end. If the connection with a video visit is poor, the visit may have to be switched to a telephone visit. With either a video or telephone visit, we are not always able to ensure that we have a secure connection.  By engaging in this virtual visit, you consent to the provision of healthcare and authorize for your insurance to be billed (if applicable) for the services provided during this visit. Depending on your insurance coverage, you may receive a charge related to this service.  I need to obtain your verbal consent now. Are you willing to proceed with your visit today? CANNA SWADLEY has provided verbal consent on 11/10/2022 for a virtual visit (video or telephone). [name], PA-C  Date: 11/10/2022 5:00 PM  Virtual Visit via Video Note   I, [name], connected with  [name]  ([id number], [birth year]) on 11/10/22 at  5:00 PM EDT by a video-enabled telemedicine application and verified that I am speaking with the correct person using two identifiers.  Location: Patient: Virtual Visit  Location Patient: Mobile Provider: Virtual Visit Location Provider: Home Office   I discussed the limitations of evaluation and management by telemedicine and the availability of in person appointments. The patient expressed understanding and agreed to proceed.    History of Present Illness: [name] is a 62 y.o. who identifies as a female who was assigned female at birth, and is being seen today for Covid 108.  HPI: URI  This is a new problem. Episode onset: tested positive for Covid 19 today; Symptoms started Saturday, 11/08/22. The problem has been gradually worsening. The maximum temperature recorded prior to her arrival was 101 - 101.9 F. The fever has been present for Less than 1 day. Associated symptoms include congestion, headaches, a plugged ear sensation, rhinorrhea, sinus pain and a sore throat. Pertinent negatives include no coughing, diarrhea, ear pain, nausea, vomiting or wheezing. Associated symptoms comments: Decreased appetite, chills, fatigue. Treatments tried: mucinex. The treatment provided no relief.    Has taken Paxlovid about 2 years ago and did well with it  Problems:  Patient Active Problem List   Diagnosis Date Noted   Encounter for general adult medical examination with abnormal findings 08/13/2022   Type II diabetes mellitus with manifestations (HCC) 08/06/2022   Abnormal electrocardiogram (ECG) (EKG) 08/06/2022   Long term (current) use of oral hypoglycemic drugs 06/26/2022   Situational anxiety 06/19/2022   Grieving 06/19/2022   Insulin-requiring or dependent type II diabetes mellitus (HCC) 11/25/2021   Thrombocytosis 10/08/2021   Screening for colon cancer  09/26/2021   Age-related osteoporosis without current pathological fracture 08/21/2021   Atrophy of vagina 08/21/2021   Diabetic polyneuropathy associated with type 2 diabetes mellitus (HCC) 06/11/2020   Diabetes mellitus (HCC) 06/11/2020   Primary hypertension 06/11/2020   Hyperlipidemia with  target LDL less than 100 06/11/2020   Papanicolaou smear of cervix with atypical squamous cells cannot exclude high grade squamous intraepithelial lesion (ASC-H) 05/09/2020   Climacteric 05/09/2020   Gastroesophageal reflux disease 05/09/2020   Major depression, single episode, in complete remission (HCC) 05/09/2020   Obesity 05/09/2020   Obstructive sleep apnea syndrome 05/09/2020   Vitamin D deficiency 05/09/2020   Allergic rhinitis 05/09/2020   Carpal tunnel syndrome 10/22/2017    Allergies:  Allergies  Allergen Reactions   Flagyl [Metronidazole] Diarrhea   Metformin And Related Shortness Of Breath   Medications:  Current Outpatient Medications:    ALPRAZolam (XANAX) 0.5 MG tablet, Take 1 tablet by mouth 2 times daily as needed for anxiety., Disp: 30 tablet, Rfl: 1   atorvastatin (LIPITOR) 10 MG tablet, Take 1 tablet (10 mg total) by mouth daily., Disp: 90 tablet, Rfl: 1   buPROPion (WELLBUTRIN XL) 300 MG 24 hr tablet, Take 1 tablet (300 mg total) by mouth daily., Disp: 90 tablet, Rfl: 1   citalopram (CELEXA) 40 MG tablet, Take 1 tablet (40 mg total) by mouth daily., Disp: 90 tablet, Rfl: 1   co-enzyme Q-10 30 MG capsule, Take 30 mg by mouth daily., Disp: , Rfl:    Continuous Glucose Receiver (DEXCOM G7 RECEIVER) DEVI, Use as directed., Disp: 9 each, Rfl: 1   Continuous Glucose Sensor (DEXCOM G7 SENSOR) MISC, Attach to skin as directed. Replace every 10 days., Disp: 3 each, Rfl: 3   Cyanocobalamin (VITAMIN B12) 1000 MCG TBCR, Take by mouth., Disp: , Rfl:    dapagliflozin propanediol (FARXIGA) 10 MG TABS tablet, Take 1 tablet (10 mg total) by mouth daily before breakfast., Disp: 90 tablet, Rfl: 1   dicyclomine (BENTYL) 10 MG capsule, Take 1 capsule (10 mg) by mouth 3 times daily as needed for spasms., Disp: 90 capsule, Rfl: 1   doxycycline (VIBRA-TABS) 100 MG tablet, Take 1 tablet (100 mg total) by mouth 2 (two) times a day for 7 days. Take with 8 oz water. Do not lie down for at least  30 minutes after., Disp: 14 tablet, Rfl: 0   doxycycline (VIBRAMYCIN) 100 MG capsule, Take 1 capsule (100 mg total) by mouth 2 (two) times daily for 7 days., Disp: 14 capsule, Rfl: 0   ipratropium (ATROVENT) 0.06 % nasal spray, Place 2 sprays into both nostrils 3 (three) times daily for 4 days., Disp: 15 mL, Rfl: 0   Lactobacillus (PROBIOTIC ACIDOPHILUS PO), Take 1 tablet by mouth daily., Disp: , Rfl:    loratadine (CLARITIN) 10 MG tablet, , Disp: , Rfl:    ondansetron (ZOFRAN-ODT) 4 MG disintegrating tablet, Dissolve 1 tablet (4 mg total) on tongue every 8 (eight) hours as needed for nausea or vomiting., Disp: 9 tablet, Rfl: 0   OVER THE COUNTER MEDICATION, GI Microb-X 1 tablet daily, Disp: , Rfl:    OVER THE COUNTER MEDICATION, Fodmate 1-2 capsules with each meal, Disp: , Rfl:    pantoprazole (PROTONIX) 20 MG tablet, Take 1 tablet (20 mg) by mouth daily., Disp: 90 tablet, Rfl: 1   ramipril (ALTACE) 2.5 MG capsule, Take 1 capsule (2.5 mg total) by mouth daily., Disp: 90 capsule, Rfl: 0   ramipril (ALTACE) 5 MG capsule, TAKE 1 CAPSULE BY  MOUTH EVERY DAY, Disp: 90 capsule, Rfl: 1   VITAMIN D PO, Take 5,000 Units by mouth., Disp: , Rfl:   Observations/Objective: Patient is well-developed, well-nourished in no acute distress.  Resting comfortably Head is normocephalic, atraumatic.  No labored breathing.  Speech is clear and coherent with logical content.  Patient is alert and oriented at baseline.    Assessment and Plan: There are no diagnoses linked to this encounter. - Continue OTC symptomatic management of choice - Will send OTC vitamins and supplement information through AVS - Paxlovid prescribed - Patient enrolled in MyChart symptom monitoring - Push fluids - Rest as needed - Discussed return precautions and when to seek in-person evaluation, sent via AVS as well   Follow Up Instructions: I discussed the assessment and treatment plan with the patient. The patient was provided an  opportunity to ask questions and all were answered. The patient agreed with the plan and demonstrated an understanding of the instructions.  A copy of instructions were sent to the patient via MyChart unless otherwise noted below.    The patient was advised to call back or seek an in-person evaluation if the symptoms worsen or if the condition fails to improve as anticipated.    [name], PA-C

## 2022-11-14 DIAGNOSIS — F4323 Adjustment disorder with mixed anxiety and depressed mood: Secondary | ICD-10-CM | POA: Diagnosis not present

## 2022-11-19 DIAGNOSIS — D225 Melanocytic nevi of trunk: Secondary | ICD-10-CM | POA: Diagnosis not present

## 2022-11-19 DIAGNOSIS — L814 Other melanin hyperpigmentation: Secondary | ICD-10-CM | POA: Diagnosis not present

## 2022-11-19 DIAGNOSIS — L821 Other seborrheic keratosis: Secondary | ICD-10-CM | POA: Diagnosis not present

## 2022-11-20 DIAGNOSIS — M79641 Pain in right hand: Secondary | ICD-10-CM | POA: Diagnosis not present

## 2022-11-20 DIAGNOSIS — M654 Radial styloid tenosynovitis [de Quervain]: Secondary | ICD-10-CM | POA: Diagnosis not present

## 2022-11-21 HISTORY — PX: WRIST SURGERY: SHX841

## 2022-11-26 ENCOUNTER — Other Ambulatory Visit (HOSPITAL_COMMUNITY): Payer: Self-pay

## 2022-11-26 DIAGNOSIS — M654 Radial styloid tenosynovitis [de Quervain]: Secondary | ICD-10-CM | POA: Diagnosis not present

## 2022-11-26 MED ORDER — HYDROCODONE-ACETAMINOPHEN 5-325 MG PO TABS
ORAL_TABLET | ORAL | 0 refills | Status: DC
  Filled 2022-11-26: qty 5, 1d supply, fill #0

## 2022-11-27 ENCOUNTER — Other Ambulatory Visit (HOSPITAL_COMMUNITY): Payer: Self-pay

## 2022-11-27 DIAGNOSIS — F4323 Adjustment disorder with mixed anxiety and depressed mood: Secondary | ICD-10-CM | POA: Diagnosis not present

## 2022-11-27 DIAGNOSIS — G8918 Other acute postprocedural pain: Secondary | ICD-10-CM | POA: Insufficient documentation

## 2022-11-27 MED ORDER — HYDROCODONE-ACETAMINOPHEN 5-325 MG PO TABS
1.0000 | ORAL_TABLET | Freq: Four times a day (QID) | ORAL | 0 refills | Status: DC
  Filled 2022-11-27: qty 8, 2d supply, fill #0

## 2022-11-29 ENCOUNTER — Encounter: Payer: Self-pay | Admitting: Internal Medicine

## 2022-12-06 ENCOUNTER — Other Ambulatory Visit (HOSPITAL_COMMUNITY): Payer: Self-pay

## 2022-12-10 DIAGNOSIS — M654 Radial styloid tenosynovitis [de Quervain]: Secondary | ICD-10-CM | POA: Insufficient documentation

## 2022-12-15 DIAGNOSIS — Z713 Dietary counseling and surveillance: Secondary | ICD-10-CM | POA: Diagnosis not present

## 2022-12-15 DIAGNOSIS — F4323 Adjustment disorder with mixed anxiety and depressed mood: Secondary | ICD-10-CM | POA: Diagnosis not present

## 2022-12-22 ENCOUNTER — Other Ambulatory Visit: Payer: Self-pay | Admitting: "Endocrinology

## 2022-12-22 ENCOUNTER — Other Ambulatory Visit: Payer: Self-pay | Admitting: Internal Medicine

## 2022-12-22 DIAGNOSIS — I10 Essential (primary) hypertension: Secondary | ICD-10-CM

## 2022-12-22 DIAGNOSIS — M79645 Pain in left finger(s): Secondary | ICD-10-CM | POA: Insufficient documentation

## 2022-12-22 DIAGNOSIS — E118 Type 2 diabetes mellitus with unspecified complications: Secondary | ICD-10-CM

## 2022-12-22 DIAGNOSIS — M79644 Pain in right finger(s): Secondary | ICD-10-CM | POA: Insufficient documentation

## 2022-12-23 ENCOUNTER — Other Ambulatory Visit: Payer: Self-pay

## 2022-12-23 ENCOUNTER — Other Ambulatory Visit (HOSPITAL_COMMUNITY): Payer: Self-pay

## 2022-12-23 MED ORDER — PANTOPRAZOLE SODIUM 20 MG PO TBEC
20.0000 mg | DELAYED_RELEASE_TABLET | Freq: Every day | ORAL | 1 refills | Status: DC
  Filled 2022-12-23: qty 90, 90d supply, fill #0
  Filled 2023-03-10: qty 90, 90d supply, fill #1

## 2022-12-23 MED ORDER — RAMIPRIL 2.5 MG PO CAPS
2.5000 mg | ORAL_CAPSULE | Freq: Every day | ORAL | 0 refills | Status: DC
  Filled 2022-12-23: qty 90, 90d supply, fill #0

## 2022-12-31 ENCOUNTER — Other Ambulatory Visit (HOSPITAL_COMMUNITY): Payer: Self-pay

## 2022-12-31 ENCOUNTER — Ambulatory Visit (INDEPENDENT_AMBULATORY_CARE_PROVIDER_SITE_OTHER): Payer: BC Managed Care – PPO | Admitting: "Endocrinology

## 2022-12-31 ENCOUNTER — Encounter: Payer: Self-pay | Admitting: "Endocrinology

## 2022-12-31 VITALS — BP 90/58 | HR 72 | Ht 66.0 in | Wt 167.4 lb

## 2022-12-31 DIAGNOSIS — Z794 Long term (current) use of insulin: Secondary | ICD-10-CM | POA: Diagnosis not present

## 2022-12-31 DIAGNOSIS — E1169 Type 2 diabetes mellitus with other specified complication: Secondary | ICD-10-CM

## 2022-12-31 DIAGNOSIS — E782 Mixed hyperlipidemia: Secondary | ICD-10-CM

## 2022-12-31 DIAGNOSIS — I10 Essential (primary) hypertension: Secondary | ICD-10-CM | POA: Diagnosis not present

## 2022-12-31 LAB — POCT GLYCOSYLATED HEMOGLOBIN (HGB A1C): HbA1c, POC (controlled diabetic range): 6.4 % (ref 0.0–7.0)

## 2022-12-31 NOTE — Patient Instructions (Signed)

## 2022-12-31 NOTE — Progress Notes (Signed)
Endocrinology follow-up note       12/31/2022, 4:18 PM   Subjective:    Patient ID: [name], female    DOB: [birth year].  [name] is being seen in follow-up after she was seen in consultation for management of currently controlled asymptomatic diabetes requested by  [name], MD.   Past Medical History:  Diagnosis Date   Depression    Diabetes mellitus without complication (HCC)    GERD (gastroesophageal reflux disease)    Hypertension    Sleep apnea     Past Surgical History:  Procedure Laterality Date   COLONOSCOPY  2013   hypo plastic polyps at [facility] in [place]   COLONOSCOPY WITH ESOPHAGOGASTRODUODENOSCOPY (EGD)  11/11/2021   DIAGNOSTIC LAPAROSCOPY     DILATION AND CURETTAGE OF UTERUS N/A 05/09/2020   Procedure: CERVICAL DILATATION AND ENDOCERVICAL CURETTAGE;  Surgeon: Steva Ready, DO;  Location: [facility];  Service: Gynecology;  Laterality: N/A;   LABIOPLASTY Left 08/10/2017   Procedure: LABIAPLASTY REVISION;  Surgeon: Geryl Rankins, MD;  Location:  SURGERY CENTER;  Service: Gynecology;  Laterality: Left;  Labiaplasty revision   UPPER GASTROINTESTINAL ENDOSCOPY     WRIST SURGERY  11/2022    Social History   Socioeconomic History   Marital status: Widowed    Spouse name: Justin Mend "[name]"   Number of children: 0   Years of education: Not on file   Highest education level: Not on file  Occupational History   Occupation: accountent  Tobacco Use   Smoking status: Former    Types: Cigarettes   Smokeless tobacco: Never   Tobacco comments:    Not every day smoker in the past  Vaping Use   Vaping status: Never Used  Substance and Sexual Activity   Alcohol use: Yes    Comment: rarely about 2 drinks per month   Drug use: Never   Sexual activity: Not Currently    Birth control/protection: Post-menopausal  Other Topics Concern   Not on file  Social History Narrative   Right handed     Lives in a one story home       Husband passed away unexpectedly 16-Apr-2022   Social Determinants of Health   Financial Resource Strain: Not on file  Food Insecurity: Not on file  Transportation Needs: Not on file  Physical Activity: Not on file  Stress: Not on file  Social Connections: Not on file    Family History  Problem Relation Age of Onset   Dementia Mother    Alzheimer's disease Mother    Hypertension Mother    Rheumatic fever Father    Bipolar disorder Sister    Diabetes Sister    High Cholesterol Sister    Bipolar disorder Brother    Depression Maternal Grandmother    Diabetes Maternal Grandfather    Pancreatic cancer Paternal Grandfather    Colon cancer Neg Hx    Colon polyps Neg Hx    Esophageal cancer Neg Hx    Rectal cancer Neg Hx    Stomach cancer Neg Hx     Outpatient Encounter Medications as of 12/31/2022  Medication Sig   ALPRAZolam (XANAX) 0.5 MG tablet Take 1 tablet by mouth 2 times daily as needed for anxiety.   atorvastatin (LIPITOR) 10 MG tablet Take 1 tablet (10 mg total) by mouth daily.   buPROPion (WELLBUTRIN XL) 300 MG 24 hr tablet Take 1 tablet (300 mg total) by mouth daily.   citalopram (CELEXA) 40  MG tablet Take 1 tablet (40 mg total) by mouth daily.   co-enzyme Q-10 30 MG capsule Take 30 mg by mouth daily.   Continuous Glucose Receiver (DEXCOM G7 RECEIVER) DEVI Use as directed.   Continuous Glucose Sensor (DEXCOM G7 SENSOR) MISC Attach to skin as directed. Replace every 10 days.   Cyanocobalamin (VITAMIN B12) 1000 MCG TBCR Take by mouth.   dapagliflozin propanediol (FARXIGA) 10 MG TABS tablet Take 1 tablet (10 mg total) by mouth daily before breakfast.   dicyclomine (BENTYL) 10 MG capsule Take 1 capsule (10 mg) by mouth 3 times daily as needed for spasms.   ipratropium (ATROVENT) 0.06 % nasal spray Place 2 sprays into both nostrils 3 (three) times daily for 4 days.   Lactobacillus (PROBIOTIC ACIDOPHILUS PO) Take 1 tablet by mouth daily.    loratadine (CLARITIN) 10 MG tablet    ondansetron (ZOFRAN-ODT) 4 MG disintegrating tablet Dissolve 1 tablet (4 mg total) on tongue every 8 (eight) hours as needed for nausea or vomiting.   OVER THE COUNTER MEDICATION GI Microb-X 1 tablet daily   OVER THE COUNTER MEDICATION Fodmate 1-2 capsules with each meal   pantoprazole (PROTONIX) 20 MG tablet Take 1 tablet (20 mg) by mouth daily.   ramipril (ALTACE) 2.5 MG capsule Take 1 capsule (2.5 mg total) by mouth daily.   VITAMIN D PO Take 5,000 Units by mouth.   [DISCONTINUED] doxycycline (VIBRA-TABS) 100 MG tablet Take 1 tablet (100 mg total) by mouth 2 (two) times a day for 7 days. Take with 8 oz water. Do not lie down for at least 30 minutes after.   [DISCONTINUED] doxycycline (VIBRAMYCIN) 100 MG capsule Take 1 capsule (100 mg total) by mouth 2 (two) times daily for 7 days.   [DISCONTINUED] HYDROcodone-acetaminophen (NORCO/VICODIN) 5-325 MG tablet Take 1 tablet by mouth every 6 (six) hours.   [DISCONTINUED] ramipril (ALTACE) 5 MG capsule TAKE 1 CAPSULE BY MOUTH EVERY DAY   No facility-administered encounter medications on file as of 12/31/2022.    ALLERGIES: Allergies  Allergen Reactions   Flagyl [Metronidazole] Diarrhea   Metformin And Related Shortness Of Breath    VACCINATION STATUS: Immunization History  Administered Date(s) Administered   Influenza,inj,Quad PF,6+ Mos 10/08/2021   PFIZER Comirnaty(Gray Top)Covid-19 Tri-Sucrose Vaccine 05/28/2020   PFIZER(Purple Top)SARS-COV-2 Vaccination 03/30/2019, 04/20/2019, 11/23/2019   PNEUMOCOCCAL CONJUGATE-20 06/11/2020   Tdap 10/06/2008, 12/20/2018   Zoster Recombinant(Shingrix) 06/11/2020, 09/10/2020    Diabetes She presents for her follow-up diabetic visit. She has type 2 diabetes mellitus. Onset time: She was diagnosed at approximate age of 62 years. Her disease course has been stable. There are no hypoglycemic associated symptoms. Pertinent negatives for hypoglycemia include no  confusion, headaches, pallor or seizures. There are no diabetic associated symptoms. Pertinent negatives for diabetes include no chest pain, no polydipsia, no polyphagia and no polyuria. There are no hypoglycemic complications. Symptoms are stable. There are no diabetic complications. Risk factors for coronary artery disease include dyslipidemia, diabetes mellitus, post-menopausal and tobacco exposure. Current diabetic treatments: She is currently on Tresiba 10 units daily, Farxiga 10 g p.o. daily. Her weight is fluctuating minimally. She is following a generally unhealthy diet. When asked about meal planning, she reported none. She has not had a previous visit with a dietitian. She participates in exercise intermittently. Her home blood glucose trend is fluctuating minimally. Her overall blood glucose range is 130-140 mg/dl. (She continues to utilize her Dexcom CGM.  She maintains controlled glycemic profile with point-of-care A1c of 6.4%.  She  has no hypoglycemia.  ) An ACE inhibitor/angiotensin II receptor blocker is being taken.  Hyperlipidemia This is a chronic problem. The current episode started more than 1 year ago. The problem is controlled. Exacerbating diseases include diabetes. Pertinent negatives include no chest pain, myalgias or shortness of breath. Current antihyperlipidemic treatment includes statins. Risk factors for coronary artery disease include diabetes mellitus, dyslipidemia, post-menopausal and family history.     Review of Systems  Constitutional:  Negative for chills, fever and unexpected weight change.  HENT:  Negative for trouble swallowing and voice change.   Eyes:  Negative for visual disturbance.  Respiratory:  Negative for cough, shortness of breath and wheezing.   Cardiovascular:  Negative for chest pain, palpitations and leg swelling.  Gastrointestinal:  Negative for diarrhea, nausea and vomiting.  Endocrine: Negative for cold intolerance, heat intolerance, polydipsia,  polyphagia and polyuria.  Musculoskeletal:  Negative for arthralgias and myalgias.  Skin:  Negative for color change, pallor, rash and wound.  Neurological:  Negative for seizures and headaches.  Psychiatric/Behavioral:  Negative for confusion and suicidal ideas.     Objective:       12/31/2022   12:58 PM 08/13/2022   12:15 PM 08/06/2022    1:36 PM  Vitals with BMI  Height 5\' 6"   5\' 6"   Weight 167 lbs 6 oz  166 lbs  BMI 27.03  26.81  Systolic 90 136 134  Diastolic 58 84 86  Pulse 72  72    BP (!) 90/58   Pulse 72   Ht 5\' 6"  (1.676 m)   Wt 167 lb 6.4 oz (75.9 kg)   LMP 02/02/2013   BMI 27.02 kg/m   Wt Readings from Last 3 Encounters:  12/31/22 167 lb 6.4 oz (75.9 kg)  08/06/22 166 lb (75.3 kg)  07/02/22 163 lb (73.9 kg)     CMP ( most recent) CMP     Component Value Date/Time   NA 139 06/25/2022 0850   K 4.8 06/25/2022 0850   CL 101 06/25/2022 0850   CO2 26 06/25/2022 0850   GLUCOSE 136 (H) 06/25/2022 0850   GLUCOSE 90 10/08/2021 0928   BUN 21 06/25/2022 0850   CREATININE 0.70 06/25/2022 0850   CALCIUM 9.3 06/25/2022 0850   PROT 6.4 06/25/2022 0850   ALBUMIN 4.2 06/25/2022 0850   AST 12 06/25/2022 0850   ALT 11 06/25/2022 0850   ALKPHOS 85 06/25/2022 0850   BILITOT <0.2 06/25/2022 0850   GFRNONAA >60 05/07/2020 1430   GFRAA >60 08/06/2017 1924     Diabetic Labs (most recent): Lab Results  Component Value Date   HGBA1C 6.4 12/31/2022   HGBA1C 6.4 06/26/2022   HGBA1C 6.2 03/25/2022   MICROALBUR <0.7 08/06/2022   MICROALBUR <0.7 08/21/2021   MICROALBUR 0.9 09/10/2020     Lipid Panel ( most recent) Lipid Panel     Component Value Date/Time   CHOL 139 06/25/2022 0850   TRIG 44 06/25/2022 0850   HDL 47 06/25/2022 0850   CHOLHDL 3.0 06/25/2022 0850   CHOLHDL 3 02/07/2021 1618   VLDL 16.6 02/07/2021 1618   LDLCALC 82 06/25/2022 0850   LABVLDL 10 06/25/2022 0850        Assessment & Plan:   1. Type 2 diabetes mellitus with other specified  complication, with long-term current use of insulin (HCC) - [name] has currently uncontrolled symptomatic type 2 DM since  62 years of age.  She continues to utilize her Dexcom CGM.  She  maintains controlled glycemic profile with point-of-care A1c of 6.4%.  She has no hypoglycemia.     Recent labs reviewed. - I had a long discussion with her about the possible risk factors and  the pathology behind its diabetes and its complications. -her diabetes is complicated by comorbid hyperlipidemia and she remains at a high risk for more acute and chronic complications which include CAD, CVA, CKD, retinopathy, and neuropathy. These are all discussed in detail with her.  - I discussed all available options of managing her diabetes including de-escalation of medications. I have counseled her on Food as Medicine by adopting a Whole Food , Plant Predominant  ( WFPP) nutrition as recommended by [employer] of Lifestyle Medicine. Patient is encouraged to switch to  unprocessed or minimally processed  complex starch, adequate protein intake (mainly plant source), minimal liquid fat, plenty of fruits, and vegetables. -  she is advised to stick to a routine mealtimes to eat 3 complete meals a day and snack only when necessary ( to snack only to correct hypoglycemia BG <70 day time or <100 at night).  - she acknowledges that there is a room for improvement in her food and drink choices. - Suggestion is made for her to avoid simple carbohydrates  from her diet including Cakes, Sweet Desserts, Ice Cream, Soda (diet and regular), Sweet Tea, Candies, Chips, Cookies, Store Bought Juices, Alcohol , Artificial Sweeteners,  Coffee Creamer, and "Sugar-free" Products, Lemonade. This will help patient to have more stable blood glucose profile and potentially avoid unintended weight gain.  The following Lifestyle Medicine recommendations according to American College of Lifestyle Medicine  [facility]) were discussed and and  offered to patient and she  agrees to start the journey:  A. Whole Foods, Plant-Based Nutrition comprising of fruits and vegetables, plant-based proteins, whole-grain carbohydrates was discussed in detail with the patient.   A list for source of those nutrients were also provided to the patient.  Patient will use only water or unsweetened tea for hydration. B.  The need to stay away from risky substances including alcohol, smoking; obtaining 7 to 9 hours of restorative sleep, at least 150 minutes of moderate intensity exercise weekly, the importance of healthy social connections,  and stress management techniques were discussed. C.  A full color page of  Calorie density of various food groups per pound showing examples of each food groups was provided to the patient.   - I have approached her with the following individualized plan to manage  her diabetes and patient agrees:   - she presents with controlled glycemic profile. -She will not need additional intervention and is on her Farxiga 10 mg p.o. daily at breakfast. This patient has a chance to reverse her diabetes.  She is encouraged to continue to utilize her Dexcom CGM.    - she is encouraged to call clinic for blood glucose levels less than 70 or above 200 mg /dl. She will be considered for low-dose GLP-1 receptor agonists on subsequent visits if necessary.  - Specific targets for  A1c;  LDL, HDL,  and Triglycerides were discussed with the patient.  2) Blood Pressure /Hypertension: Her blood pressure is tightly controlled.  She is on low-dose ramipril 2.5 mg p.o. daily for renal protection.  She reports intermittent dizziness.  She is advised to measure blood pressure at home, and hold her ramipril if blood pressure is less than 100/60 mmHg.    3) Lipids/Hyperlipidemia:   Review of her recent lipid panel  showed  controlled  LDL improving at 82.  She is advised to continue Lipitor 10 mg p.o. nightly.  .  Side effects and precautions  discussed with her.  4)  Weight/Diet:  Body mass index is 27.02 kg/m.  -    she is  a candidate for some weight loss.  I discussed with her the fact that loss of 5 - 10% of her  current body weight will have the most impact on her diabetes management.  The above detailed  ACLM recommendations for nutrition, exercise, sleep, social life, avoidance of risky substances, the need for restorative sleep   information will also detailed on discharge instructions.  5) Chronic Care/Health Maintenance:  -she  is on ACEI/ARB and Statin medications and  is encouraged to initiate and continue to follow up with Ophthalmology, Dentist,  Podiatrist at least yearly or according to recommendations, and advised to   stay away from smoking. I have recommended yearly flu vaccine and pneumonia vaccine at least every 5 years; moderate intensity exercise for up to 150 minutes weekly; and  sleep for 7- 9 hours a day.  She gives prior history of osteoporosis, not currently on treatment.  We will attempt to obtain a copy of her recent DEXA scan results.  - she is  advised to maintain close follow up with [name], MD for primary care needs, as well as her other providers for optimal and coordinated care.   I spent  26  minutes in the care of the patient today including review of labs from CMP, Lipids, Thyroid Function, Hematology (current and previous including abstractions from other facilities); face-to-face time discussing  her blood glucose readings/logs, discussing hypoglycemia and hyperglycemia episodes and symptoms, medications doses, her options of short and long term treatment based on the latest standards of care / guidelines;  discussion about incorporating lifestyle medicine;  and documenting the encounter. Risk reduction counseling performed per USPSTF guidelines to reduce  cardiovascular risk factors.     Please refer to Patient Instructions for Blood Glucose Monitoring and Insulin/Medications Dosing  Guide"  in media tab for additional information. Please  also refer to " Patient Self Inventory" in the Media  tab for reviewed elements of pertinent patient history.  [name] participated in the discussions, expressed understanding, and voiced agreement with the above plans.  All questions were answered to her satisfaction. she is encouraged to contact clinic should she have any questions or concerns prior to her return visit.    Follow up plan: - Return in about 6 months (around 07/01/2023) for F/U with Pre-visit Labs, Meter/CGM/Logs, A1c here.  Marquis Lunch, MD [facility] [address] [place], [place] [address] Phone: [phone]  Fax: [phone]    12/31/2022, 4:18 PM  This note was partially dictated with voice recognition software. Similar sounding words can be transcribed inadequately or may not  be corrected upon review.

## 2023-01-01 ENCOUNTER — Other Ambulatory Visit (HOSPITAL_COMMUNITY): Payer: Self-pay

## 2023-01-01 ENCOUNTER — Ambulatory Visit (INDEPENDENT_AMBULATORY_CARE_PROVIDER_SITE_OTHER): Payer: BC Managed Care – PPO | Admitting: Adult Health

## 2023-01-01 VITALS — BP 140/79 | HR 78 | Ht 65.0 in | Wt 166.0 lb

## 2023-01-01 DIAGNOSIS — F325 Major depressive disorder, single episode, in full remission: Secondary | ICD-10-CM | POA: Diagnosis not present

## 2023-01-01 DIAGNOSIS — F418 Other specified anxiety disorders: Secondary | ICD-10-CM | POA: Diagnosis not present

## 2023-01-01 DIAGNOSIS — F5102 Adjustment insomnia: Secondary | ICD-10-CM

## 2023-01-01 DIAGNOSIS — M79644 Pain in right finger(s): Secondary | ICD-10-CM | POA: Diagnosis not present

## 2023-01-01 MED ORDER — CITALOPRAM HYDROBROMIDE 40 MG PO TABS
40.0000 mg | ORAL_TABLET | Freq: Every day | ORAL | 1 refills | Status: DC
  Filled 2023-01-01: qty 90, 90d supply, fill #0
  Filled 2023-04-07: qty 90, 90d supply, fill #1

## 2023-01-01 MED ORDER — BUPROPION HCL ER (XL) 300 MG PO TB24
300.0000 mg | ORAL_TABLET | Freq: Every day | ORAL | 1 refills | Status: DC
  Filled 2023-01-01 – 2023-03-10 (×2): qty 90, 90d supply, fill #0

## 2023-01-01 MED ORDER — ALPRAZOLAM 0.5 MG PO TABS
0.5000 mg | ORAL_TABLET | Freq: Every evening | ORAL | 2 refills | Status: DC | PRN
  Filled 2023-01-01: qty 30, 30d supply, fill #0
  Filled 2023-03-10: qty 30, 30d supply, fill #1
  Filled 2023-05-06: qty 30, 30d supply, fill #2

## 2023-01-01 NOTE — Progress Notes (Signed)
[facility] MD/PA/NP Initial Note  01/02/2023 8:24 AM [name]  MRN:  [id number]  Chief Complaint:   HPI:   Patient seen today for initial psychiatric evaluation.   Working with Sharrie Rothman - therapist since 2019.  Describes mood today as "variable". Pleasant. Tearful at times. Mood symptoms - denies depression - reports "sadness". Denies feeling hopeless. Reports feeling alone. Reports anxiety - "about a lot of things". Reports being fearful - "the after effects after loss of husband". Reports she lost her husband unexpectedly in March. Feels like she is just now starting to grieve his lost. Reports the first 8 months after husband's death were the most difficult with all the tasks she had to take care of. She and husband were married for 28 years. Reports having to spend retirement to fix up the house. Reports anxiety. Denies panic attacks. Reports thinking more about death - her mortality. Reports irritability at times. Reports some worry and rumination - "may be a normal part of where I am". Reports some over thinking - more about the future. Reports negative thoughts - being pissed off - what's the point. She is struggling with mood symptoms - but is more concerned about her lack of sleep. She is currently prescribed Wellbutrin and Celexa for mood symptoms and has taken Xanax as needed for sleep. Stable interest and motivation. Taking medications as prescribed.  Energy levels lower overall. Active, does not have a regular exercise routine.   Enjoys some usual interests and activities. Lives alone with 2 dogs - 11 years. Spending time with family. Appetite adequate. Weight gain 5 pounds over past month to month and a half.. Sleeps well most nights. Averages 5 to 6 hours - irregular sleep - "it interferes with how I function". Reports some daytime napping - "goes in spells". Focus and concentration stable. Completing tasks. Managing aspects of household. Retired. Denies SI or HI.  Denies  AH or VH. Denies self harm. Denies substance use.  Previous medication trials: Wellbutrin, Celexa  Visit Diagnosis:    ICD-10-CM   1. Major depression, single episode, in complete remission (HCC)  F32.5 buPROPion (WELLBUTRIN XL) 300 MG 24 hr tablet    citalopram (CELEXA) 40 MG tablet    2. Situational anxiety  F41.8 ALPRAZolam (XANAX) 0.5 MG tablet    3. Adjustment insomnia  F51.02       Past Psychiatric History: Denies psychiatric hospitalization.   Past Medical History:  Past Medical History:  Diagnosis Date   Depression    Diabetes mellitus without complication (HCC)    GERD (gastroesophageal reflux disease)    Hypertension    Sleep apnea     Past Surgical History:  Procedure Laterality Date   COLONOSCOPY  2013   hypo plastic polyps at [facility] [place]   COLONOSCOPY WITH ESOPHAGOGASTRODUODENOSCOPY (EGD)  11/11/2021   DIAGNOSTIC LAPAROSCOPY     DILATION AND CURETTAGE OF UTERUS N/A 05/09/2020   Procedure: CERVICAL DILATATION AND ENDOCERVICAL CURETTAGE;  Surgeon: Steva Ready, DO;  Location: [facility];  Service: Gynecology;  Laterality: N/A;   LABIOPLASTY Left 08/10/2017   Procedure: LABIAPLASTY REVISION;  Surgeon: Geryl Rankins, MD;  Location: [facility];  Service: Gynecology;  Laterality: Left;  Labiaplasty revision   UPPER GASTROINTESTINAL ENDOSCOPY     WRIST SURGERY  11/2022    Family Psychiatric History: Reports family history of mental illness.   Family History:  Family History  Problem Relation Age of Onset   Dementia Mother    Alzheimer's  disease Mother    Hypertension Mother    Rheumatic fever Father    Bipolar disorder Sister    Diabetes Sister    High Cholesterol Sister    Bipolar disorder Brother    Depression Maternal Grandmother    Diabetes Maternal Grandfather    Pancreatic cancer Paternal Grandfather    Colon cancer Neg Hx    Colon polyps Neg Hx    Esophageal cancer Neg Hx    Rectal cancer Neg Hx     Stomach cancer Neg Hx     Social History:  Social History   Socioeconomic History   Marital status: Widowed    Spouse name: Justin Mend "[name]"   Number of children: 0   Years of education: Not on file   Highest education level: Not on file  Occupational History   Occupation: accountent  Tobacco Use   Smoking status: Former    Types: Cigarettes   Smokeless tobacco: Never   Tobacco comments:    Not every day smoker in the past  Vaping Use   Vaping status: Never Used  Substance and Sexual Activity   Alcohol use: Yes    Comment: rarely about 2 drinks per month   Drug use: Never   Sexual activity: Not Currently    Birth control/protection: Post-menopausal  Other Topics Concern   Not on file  Social History Narrative   Right handed    Lives in a one story home       Husband passed away unexpectedly 04/12/2022   Social Drivers of Health   Financial Resource Strain: Not on file  Food Insecurity: Not on file  Transportation Needs: Not on file  Physical Activity: Not on file  Stress: Not on file  Social Connections: Not on file    Allergies:  Allergies  Allergen Reactions   Flagyl [Metronidazole] Diarrhea   Metformin And Related Shortness Of Breath    Metabolic Disorder Labs: Lab Results  Component Value Date   HGBA1C 6.4 12/31/2022   No results found for: "PROLACTIN" Lab Results  Component Value Date   CHOL 139 06/25/2022   TRIG 44 06/25/2022   HDL 47 06/25/2022   CHOLHDL 3.0 06/25/2022   VLDL 40.9 02/07/2021   LDLCALC 82 06/25/2022   LDLCALC 88 02/07/2021   Lab Results  Component Value Date   TSH 2.380 06/25/2022   TSH 2.47 01/27/2022    Therapeutic Level Labs: No results found for: "LITHIUM" No results found for: "VALPROATE" No results found for: "CBMZ"  Current Medications: Current Outpatient Medications  Medication Sig Dispense Refill   ALPRAZolam (XANAX) 0.5 MG tablet Take 1 tablet (0.5 mg total) by mouth at bedtime as needed for anxiety. 30  tablet 2   atorvastatin (LIPITOR) 10 MG tablet Take 1 tablet (10 mg total) by mouth daily. 90 tablet 1   buPROPion (WELLBUTRIN XL) 300 MG 24 hr tablet Take 1 tablet (300 mg total) by mouth daily. 90 tablet 1   citalopram (CELEXA) 40 MG tablet Take 1 tablet (40 mg total) by mouth daily. 90 tablet 1   co-enzyme Q-10 30 MG capsule Take 30 mg by mouth daily.     Continuous Glucose Receiver (DEXCOM G7 RECEIVER) DEVI Use as directed. 9 each 1   Continuous Glucose Sensor (DEXCOM G7 SENSOR) MISC Attach to skin as directed. Replace every 10 days. 3 each 3   Cyanocobalamin (VITAMIN B12) 1000 MCG TBCR Take by mouth.     dapagliflozin propanediol (FARXIGA) 10 MG TABS tablet Take 1  tablet (10 mg total) by mouth daily before breakfast. 90 tablet 1   dicyclomine (BENTYL) 10 MG capsule Take 1 capsule (10 mg) by mouth 3 times daily as needed for spasms. 90 capsule 1   ipratropium (ATROVENT) 0.06 % nasal spray Place 2 sprays into both nostrils 3 (three) times daily for 4 days. 15 mL 0   Lactobacillus (PROBIOTIC ACIDOPHILUS PO) Take 1 tablet by mouth daily.     loratadine (CLARITIN) 10 MG tablet      ondansetron (ZOFRAN-ODT) 4 MG disintegrating tablet Dissolve 1 tablet (4 mg total) on tongue every 8 (eight) hours as needed for nausea or vomiting. 9 tablet 0   OVER THE COUNTER MEDICATION GI Microb-X 1 tablet daily     OVER THE COUNTER MEDICATION Fodmate 1-2 capsules with each meal     pantoprazole (PROTONIX) 20 MG tablet Take 1 tablet (20 mg) by mouth daily. 90 tablet 1   ramipril (ALTACE) 2.5 MG capsule Take 1 capsule (2.5 mg total) by mouth daily. 90 capsule 0   VITAMIN D PO Take 5,000 Units by mouth.     No current facility-administered medications for this visit.    Medication Side Effects: none  Orders placed this visit:  No orders of the defined types were placed in this encounter.   Psychiatric Specialty Exam:  Review of Systems  Last menstrual period 02/02/2013.There is no height or weight on file  to calculate BMI.  General Appearance: Casual and Neat  Eye Contact:  Good  Speech:  Clear and Coherent and Normal Rate  Volume:  Normal  Mood:  Depressed  Affect:  Appropriate and Congruent  Thought Process:  Coherent and Descriptions of Associations: Intact  Orientation:  Full (Time, Place, and Person)  Thought Content: Logical   Suicidal Thoughts:  No  Homicidal Thoughts:  No  Memory:  WNL  Judgement:  Good  Insight:  Good  Psychomotor Activity:  Normal  Concentration:  Concentration: Good and Attention Span: Good  Recall:  Good  Fund of Knowledge: Good  Language: Good  Assets:  Communication Skills Desire for Improvement Financial Resources/Insurance Housing Intimacy Leisure Time Physical Health Resilience Social Support Talents/Skills Transportation Vocational/Educational  ADL's:  Intact  Cognition: WNL  Prognosis:  Good   Screenings:  GAD-7    Garment/textile technologist Visit from 06/19/2022 in [facility]  Total GAD-7 Score 21      PHQ2-9    Flowsheet Row Office Visit from 08/06/2022 in [facility] [place] Office Visit from 06/19/2022 in [facility] [place] Office Visit from 08/21/2021 in [facility] Office Visit from 09/10/2020 in [facility] [place] Nutrition from 08/14/2020 in [facility]  PHQ-2 Total Score 4 0 0 0 0  PHQ-9 Total Score 8 -- 5 0 --      Flowsheet Row Admission (Discharged) from 05/09/2020 in [facility]  C-SSRS RISK CATEGORY No Risk       Receiving Psychotherapy: Yes   Treatment Plan/Recommendations:  Current medications: Wellbutrin XL300mg  Celexa 40mg  daily  Add Xanax 0.5mg  at hs for sleep   Time spent with patient was 60 minutes. Greater than 50% of face to face time with patient was spent on counseling and coordination of care.  Discussed therapy and medication. Discussed support for loss of spouse.  2 months  Pt to  follow-up with this provider in 4 weeks or sooner if clinically indicated.     Discussed potential benefits, risk, and side effects of benzodiazepines to include potential risk of tolerance and dependence, as well as possible drowsiness.  Advised patient not to drive if experiencing drowsiness and to take lowest possible effective dose to minimize risk of dependence and tolerance.   Dorothyann Gibbs, NP

## 2023-01-02 ENCOUNTER — Other Ambulatory Visit (HOSPITAL_COMMUNITY): Payer: Self-pay

## 2023-01-02 ENCOUNTER — Encounter: Payer: Self-pay | Admitting: Adult Health

## 2023-01-02 ENCOUNTER — Other Ambulatory Visit: Payer: Self-pay

## 2023-01-05 ENCOUNTER — Ambulatory Visit: Payer: BC Managed Care – PPO | Admitting: Neurology

## 2023-01-07 DIAGNOSIS — F4323 Adjustment disorder with mixed anxiety and depressed mood: Secondary | ICD-10-CM | POA: Diagnosis not present

## 2023-01-08 DIAGNOSIS — M79644 Pain in right finger(s): Secondary | ICD-10-CM | POA: Diagnosis not present

## 2023-01-09 DIAGNOSIS — Z713 Dietary counseling and surveillance: Secondary | ICD-10-CM | POA: Diagnosis not present

## 2023-01-22 DIAGNOSIS — F4323 Adjustment disorder with mixed anxiety and depressed mood: Secondary | ICD-10-CM | POA: Diagnosis not present

## 2023-01-23 DIAGNOSIS — M79644 Pain in right finger(s): Secondary | ICD-10-CM | POA: Diagnosis not present

## 2023-01-27 ENCOUNTER — Encounter: Payer: Self-pay | Admitting: Neurology

## 2023-01-27 ENCOUNTER — Ambulatory Visit: Payer: BC Managed Care – PPO | Admitting: Neurology

## 2023-01-27 VITALS — BP 119/74 | HR 87 | Ht 65.0 in | Wt 168.0 lb

## 2023-01-27 DIAGNOSIS — R202 Paresthesia of skin: Secondary | ICD-10-CM

## 2023-01-27 NOTE — Progress Notes (Signed)
 Follow-up Visit   Date: 01/27/2023    [name] MRN: [id number] DOB: July 21, 1960    [name] is a 63 y.o. right-handed Caucasian female with diabetes mellitus, hyperlipidemia, depression, and hypertension returning to the clinic for follow-up of left foot weakness.  The patient was accompanied to the clinic by self.  IMPRESSION/PLAN: Intermittent paresthesias of the chest and arms, most likely stress reaction.  Consider NCS/EMG of the arms, if symptoms persist.   Left foot drop, resolved.  Symptoms most likely due to transient compression of peroneal nerve.     Return to clinic in 6 months  --------------------------------------------- UPDATE 12/27/2021:  About a month ago, she had one spell of having weakness in her left foot where she was unable to raise her left foot at the ankle or extend her toes. Symptoms lasted 3 hours.  Prior to this, she was sitting eating lunch for about 15 minutes and noticed symptoms when she returned to work and was walking in the parking lot.  She recalls having to raise the left leg to clear the ground.  She had associated numbness of the foot.  There was no weakness/numbness involving the face or arm.  No associated back pain or shooting.   This has not recurred.  She does cross her legs, but does not remember if she was sitting in this position.  She also complains of burning and tingling in the forearm.    UPDATE 06/30/2022:  She is here for 70-month follow-up.  She has been having a rough few months after her husband passed away suddenly in Apr 07, 2023.  She reports that he has a lot of things and hoarded for more than 10 years with two home, vehicles, and other items that she is trying to get valued to sell.  She is very tearful at today's visit and expresses being overwhelmed.    She reports having new tingling over the arms and legs.  No weakness or imbalance.   UPDATE 01/27/2023:  She is here for 6 month follow-up.  She reports having 2-3  days of the week where she has some tingling in the legs, which can last up to half a day.  Symptoms are intermittent when it occurs.  She also reports having occasional electrical-like sensation across her chest, radiating into the arms, and abdomen, when she wakes up.  She feels very stressed when this occurs and feels that it is more of stress reaction.  No associated weakness or neck pain.  She reports that she is in a better state of mind now.  She has managed the more urgent affairs after her husband's passing and now taking things a little slower.   Medications:  Current Outpatient Medications on File Prior to Visit  Medication Sig Dispense Refill   ALPRAZolam  (XANAX ) 0.5 MG tablet Take 1 tablet (0.5 mg total) by mouth at bedtime as needed for anxiety. 30 tablet 2   atorvastatin  (LIPITOR) 10 MG tablet Take 1 tablet (10 mg total) by mouth daily. 90 tablet 1   buPROPion  (WELLBUTRIN  XL) 300 MG 24 hr tablet Take 1 tablet (300 mg total) by mouth daily. 90 tablet 1   citalopram  (CELEXA ) 40 MG tablet Take 1 tablet (40 mg total) by mouth daily. 90 tablet 1   co-enzyme Q-10 30 MG capsule Take 30 mg by mouth daily.     Continuous Glucose Receiver (DEXCOM G7 RECEIVER) DEVI Use as directed. 9 each 1   Continuous Glucose Sensor (DEXCOM G7 SENSOR)  MISC Attach to skin as directed. Replace every 10 days. 3 each 3   Cyanocobalamin  (VITAMIN B12) 1000 MCG TBCR Take by mouth.     dapagliflozin  propanediol (FARXIGA ) 10 MG TABS tablet Take 1 tablet (10 mg total) by mouth daily before breakfast. 90 tablet 1   loratadine (CLARITIN) 10 MG tablet      pantoprazole  (PROTONIX ) 20 MG tablet Take 1 tablet (20 mg) by mouth daily. 90 tablet 1   ramipril  (ALTACE ) 2.5 MG capsule Take 1 capsule (2.5 mg total) by mouth daily. 90 capsule 0   VITAMIN D  PO Take 5,000 Units by mouth.     dicyclomine  (BENTYL ) 10 MG capsule Take 1 capsule (10 mg) by mouth 3 times daily as needed for spasms. 90 capsule 1   ipratropium (ATROVENT )  0.06 % nasal spray Place 2 sprays into both nostrils 3 (three) times daily for 4 days. 15 mL 0   OVER THE COUNTER MEDICATION GI Microb-X 1 tablet daily (Patient not taking: Reported on 01/27/2023)     OVER THE COUNTER MEDICATION Fodmate 1-2 capsules with each meal (Patient not taking: Reported on 01/27/2023)     No current facility-administered medications on file prior to visit.    Allergies:  Allergies  Allergen Reactions   Flagyl [Metronidazole] Diarrhea   Metformin And Related Shortness Of Breath    Vital Signs:  BP 119/74   Pulse 87   Ht 5' 5 (1.651 m)   Wt 168 lb (76.2 kg)   LMP 02/02/2013   SpO2 98%   BMI 27.96 kg/m   Neurological Exam: MENTAL STATUS including orientation to time, place, person, recent and remote memory, attention span and concentration, language, and fund of knowledge is normal.  Speech is not dysarthric.  CRANIAL NERVES:  Pupils equal round and reactive to light, mild anisocoria with left pupil slightly larger.  Normal conjugate, extra-ocular eye movements in all directions of gaze.  No ptosis.  Face is symmetric.   MOTOR:  Motor strength is 5/5 in all extremities, including left foot dorsiflexion and toe extension.  No atrophy, fasciculations or abnormal movements.  MSRs:  Reflexes are 2+/4 throughout, including distally.  SENSORY:  Vibration intact throughout.  COORDINATION/GAIT:  Normal finger-to- nose-finger.  Intact rapid alternating movements bilaterally.  Gait narrow based and stable.    Data:  Lab Results  Component Value Date   HGBA1C 6.4 12/31/2022     Thank you for allowing me to participate in patient's care.  If I can answer any additional questions, I would be pleased to do so.    Sincerely,    Zniya Cottone K. Tobie, DO

## 2023-02-11 LAB — HM DIABETES EYE EXAM

## 2023-02-12 DIAGNOSIS — Z713 Dietary counseling and surveillance: Secondary | ICD-10-CM | POA: Diagnosis not present

## 2023-02-17 DIAGNOSIS — F4323 Adjustment disorder with mixed anxiety and depressed mood: Secondary | ICD-10-CM | POA: Diagnosis not present

## 2023-03-03 ENCOUNTER — Telehealth: Payer: Self-pay

## 2023-03-03 NOTE — Telephone Encounter (Signed)
Pt called stating she's noticed her glucose is higher after meals than usual for the past 1-2 wks. States the only change she has made is starting melatonin before bed. Pt's CGM data uploaded and evaluated by Dr.Nida. Advised pt according to her CGM data no changes are needed in her medication and there was no mention of the melatonin being a culprit causing an elevation in her glucose per Dr.Nida. Advised pt to be as active as possible and to avoid any extra carbohydrate intake and contact the office in two weeks to review her CGM data per Dr.Nida. Pt voiced understanding.

## 2023-03-10 ENCOUNTER — Encounter: Payer: Self-pay | Admitting: Adult Health

## 2023-03-10 ENCOUNTER — Other Ambulatory Visit: Payer: Self-pay

## 2023-03-10 ENCOUNTER — Other Ambulatory Visit: Payer: Self-pay | Admitting: Internal Medicine

## 2023-03-10 ENCOUNTER — Ambulatory Visit (INDEPENDENT_AMBULATORY_CARE_PROVIDER_SITE_OTHER): Payer: BC Managed Care – PPO | Admitting: Adult Health

## 2023-03-10 ENCOUNTER — Other Ambulatory Visit (HOSPITAL_COMMUNITY): Payer: Self-pay

## 2023-03-10 DIAGNOSIS — F5102 Adjustment insomnia: Secondary | ICD-10-CM

## 2023-03-10 DIAGNOSIS — F325 Major depressive disorder, single episode, in full remission: Secondary | ICD-10-CM

## 2023-03-10 DIAGNOSIS — F418 Other specified anxiety disorders: Secondary | ICD-10-CM | POA: Diagnosis not present

## 2023-03-10 DIAGNOSIS — E785 Hyperlipidemia, unspecified: Secondary | ICD-10-CM

## 2023-03-10 DIAGNOSIS — E1142 Type 2 diabetes mellitus with diabetic polyneuropathy: Secondary | ICD-10-CM

## 2023-03-10 DIAGNOSIS — E118 Type 2 diabetes mellitus with unspecified complications: Secondary | ICD-10-CM

## 2023-03-10 NOTE — Progress Notes (Signed)
[name] [id number] Jan 29, 1960 63 y.o.  Subjective:   Patient ID:  [name] is a 63 y.o. (DOB 08-29-1960) female.  Chief Complaint: No chief complaint on file.   HPI [name] presents to the office today for follow-up of MDD, situational anxiety and adjustment insomnia.  Working with Sharrie Rothman - therapist since 2019.  Describes mood today as "better". Pleasant. Tearful at times. Mood symptoms - denies depression. Reports stable interest and motivation. Reports feeling alone at times. Reports anxiety has improved - "it's better - having moments". Reports irritability at times. Denies panic attacks. Reports being fearful at times. Continues to grieve loss of husband - passed away unexpectedly in 04-24-2023. Reports some worry and rumination - "not causing me any problems". Reports some over thinking. Mood is more consistent. Stating "overall I feel like I'm doing pretty well". She is currently prescribed Wellbutrin and Celexa for mood symptoms and feels they are helpful. Reports taking Xanax as needed for sleep. Taking medications as prescribed.  Energy levels lower, but making herself do things. Active, does not have a regular exercise routine.   Enjoys some usual interests and activities. Lives alone with 2 dogs - 11 years. Spending time with family. Appetite adequate. Weight stable. Sleeps well most nights. Averages 6 to 7 hours - waking up and dreaming hard. Reports some daytime napping - 3 to 4 days a week. Reports focus and concentration stable. Completing tasks. Managing aspects of household. Retired. Denies SI or HI.  Denies AH or VH. Denies self harm. Denies substance use.  Previous medication trials: Wellbutrin, Celexa  GAD-7    Flowsheet Row Office Visit from 06/19/2022 in [facility]  Total GAD-7 Score 21      PHQ2-9    Flowsheet Row Office Visit from 08/06/2022 in [facility]  Visit from 06/19/2022 in [facility] at [facility] Office Visit from 08/21/2021 in [facility] at [facility] Office Visit from 09/10/2020 in [facility] [place] [facility] from 08/14/2020 in [facility]  - [facility]  PHQ-2 Total Score 4 0 0 0 0  PHQ-9 Total Score 8 -- 5 0 --      Flowsheet Row Admission (Discharged) from 05/09/2020 in [facility]  C-SSRS RISK CATEGORY No Risk        Review of Systems:  Review of Systems  Musculoskeletal:  Negative for gait problem.  Neurological:  Negative for tremors.  Psychiatric/Behavioral:         Please refer to HPI    Medications: I have reviewed the patient's current medications.  Current Outpatient Medications  Medication Sig Dispense Refill   ALPRAZolam (XANAX) 0.5 MG tablet Take 1 tablet (0.5 mg total) by mouth at bedtime as needed for anxiety. 30 tablet 2   atorvastatin (LIPITOR) 10 MG tablet Take 1 tablet (10 mg total) by mouth daily. 90 tablet 1   buPROPion (WELLBUTRIN XL) 300 MG 24 hr tablet Take 1 tablet (300 mg total) by mouth daily. 90 tablet 1   citalopram (CELEXA) 40 MG tablet Take 1 tablet (40 mg total) by mouth daily. 90 tablet 1   co-enzyme Q-10 30 MG capsule Take 30 mg by mouth daily.     Continuous Glucose Receiver (DEXCOM G7 RECEIVER) DEVI Use as directed. 9 each 1   Continuous Glucose Sensor (DEXCOM G7 SENSOR) MISC Attach to  skin as directed. Replace every 10 days. 3 each 3   Cyanocobalamin (VITAMIN B12) 1000 MCG TBCR Take by mouth.     dapagliflozin propanediol (FARXIGA) 10 MG TABS tablet Take 1 tablet (10 mg total) by mouth daily before breakfast. 90 tablet 1   dicyclomine (BENTYL) 10 MG capsule Take 1 capsule (10 mg) by mouth 3 times daily as needed for spasms. 90 capsule 1   ipratropium (ATROVENT) 0.06 % nasal spray Place 2 sprays into both nostrils 3 (three) times daily for 4 days. 15 mL 0   loratadine (CLARITIN)  10 MG tablet      OVER THE COUNTER MEDICATION GI Microb-X 1 tablet daily (Patient not taking: Reported on 01/27/2023)     OVER THE COUNTER MEDICATION Fodmate 1-2 capsules with each meal (Patient not taking: Reported on 01/27/2023)     pantoprazole (PROTONIX) 20 MG tablet Take 1 tablet (20 mg) by mouth daily. 90 tablet 1   ramipril (ALTACE) 2.5 MG capsule Take 1 capsule (2.5 mg total) by mouth daily. 90 capsule 0   VITAMIN D PO Take 5,000 Units by mouth.     No current facility-administered medications for this visit.    Medication Side Effects: None  Allergies:  Allergies  Allergen Reactions   Flagyl [Metronidazole] Diarrhea   Metformin And Related Shortness Of Breath    Past Medical History:  Diagnosis Date   Depression    Diabetes mellitus without complication (HCC)    GERD (gastroesophageal reflux disease)    Hypertension    Sleep apnea     Past Medical History, Surgical history, Social history, and Family history were reviewed and updated as appropriate.   Please see review of systems for further details on the patient's review from today.   Objective:   Physical Exam:  LMP 02/02/2013   Physical Exam Constitutional:      General: She is not in acute distress. Musculoskeletal:        General: No deformity.  Neurological:     Mental Status: She is alert and oriented to person, place, and time.     Coordination: Coordination normal.  Psychiatric:        Attention and Perception: Attention and perception normal. She does not perceive auditory or visual hallucinations.        Mood and Affect: Affect is not labile, blunt, angry or inappropriate.        Speech: Speech normal.        Behavior: Behavior normal.        Thought Content: Thought content normal. Thought content is not paranoid or delusional. Thought content does not include homicidal or suicidal ideation. Thought content does not include homicidal or suicidal plan.        Cognition and Memory: Cognition and  memory normal.        Judgment: Judgment normal.     Comments: Insight intact     Lab Review:     Component Value Date/Time   NA 139 06/25/2022 0850   K 4.8 06/25/2022 0850   CL 101 06/25/2022 0850   CO2 26 06/25/2022 0850   GLUCOSE 136 (H) 06/25/2022 0850   GLUCOSE 90 10/08/2021 0928   BUN 21 06/25/2022 0850   CREATININE 0.70 06/25/2022 0850   CALCIUM 9.3 06/25/2022 0850   PROT 6.4 06/25/2022 0850   ALBUMIN 4.2 06/25/2022 0850   AST 12 06/25/2022 0850   ALT 11 06/25/2022 0850   ALKPHOS 85 06/25/2022 0850   BILITOT <0.2 06/25/2022 0850  GFRNONAA >60 05/07/2020 1430   GFRAA >60 08/06/2017 1924       Component Value Date/Time   WBC 7.5 08/06/2022 1440   RBC 4.25 08/06/2022 1440   HGB 13.9 08/06/2022 1440   HCT 41.2 08/06/2022 1440   PLT 370.0 08/06/2022 1440   MCV 97.0 08/06/2022 1440   MCH 31.8 02/05/2014 2146   MCHC 33.7 08/06/2022 1440   RDW 13.1 08/06/2022 1440   LYMPHSABS 2.6 08/06/2022 1440   MONOABS 0.8 08/06/2022 1440   EOSABS 0.3 08/06/2022 1440   BASOSABS 0.0 08/06/2022 1440    No results found for: "POCLITH", "LITHIUM"   No results found for: "PHENYTOIN", "PHENOBARB", "VALPROATE", "CBMZ"   .res Assessment: Plan:    Treatment Plan/Recommendations:  Current medications: Wellbutrin XL300mg  Celexa 40mg  daily Xanax 0.5mg  at hs for sleep  3 to 4 months   Discussed potential benefits, risk, and side effects of benzodiazepines to include potential risk of tolerance and dependence, as well as possible drowsiness.  Advised patient not to drive if experiencing drowsiness and to take lowest possible effective dose to minimize risk of dependence and tolerance.   There are no diagnoses linked to this encounter.   Please see After Visit Summary for patient specific instructions.  Future Appointments  Date Time Provider Department Center  04/29/2023 10:50 AM Imogene Burn, MD [facility]  07/06/2023  3:00 PM Roma Kayser, MD [facility] None   07/27/2023  1:30 PM Nita Sickle K, DO [facility] None    No orders of the defined types were placed in this encounter.   -------------------------------

## 2023-03-11 ENCOUNTER — Other Ambulatory Visit (HOSPITAL_COMMUNITY): Payer: Self-pay

## 2023-03-12 DIAGNOSIS — F4323 Adjustment disorder with mixed anxiety and depressed mood: Secondary | ICD-10-CM | POA: Diagnosis not present

## 2023-03-14 ENCOUNTER — Other Ambulatory Visit (HOSPITAL_COMMUNITY): Payer: Self-pay

## 2023-03-14 ENCOUNTER — Other Ambulatory Visit: Payer: Self-pay | Admitting: Internal Medicine

## 2023-03-14 DIAGNOSIS — E785 Hyperlipidemia, unspecified: Secondary | ICD-10-CM

## 2023-03-14 DIAGNOSIS — E118 Type 2 diabetes mellitus with unspecified complications: Secondary | ICD-10-CM

## 2023-03-14 DIAGNOSIS — E1142 Type 2 diabetes mellitus with diabetic polyneuropathy: Secondary | ICD-10-CM

## 2023-03-19 DIAGNOSIS — Z713 Dietary counseling and surveillance: Secondary | ICD-10-CM | POA: Diagnosis not present

## 2023-03-19 NOTE — Telephone Encounter (Signed)
 Copied from CRM [id number]. Topic: Appointments - Scheduling Inquiry for Clinic >> Mar 18, 2023 11:12 AM Fredrich Romans wrote: Reason for CRM: Patient is due for an office visit to receive medications that she's out of.Dr Yetta Barre doesn't have anything available until April1,I have .Patient would like to know if she could have a refill on her medication until then or if she could be worked in before then to see Dr Yetta Barre?

## 2023-03-24 ENCOUNTER — Other Ambulatory Visit: Payer: Self-pay

## 2023-03-24 ENCOUNTER — Other Ambulatory Visit (HOSPITAL_COMMUNITY): Payer: Self-pay

## 2023-03-26 ENCOUNTER — Other Ambulatory Visit (HOSPITAL_COMMUNITY): Payer: Self-pay

## 2023-03-28 ENCOUNTER — Other Ambulatory Visit (HOSPITAL_COMMUNITY): Payer: Self-pay

## 2023-03-31 DIAGNOSIS — F4323 Adjustment disorder with mixed anxiety and depressed mood: Secondary | ICD-10-CM | POA: Diagnosis not present

## 2023-04-01 ENCOUNTER — Other Ambulatory Visit (HOSPITAL_COMMUNITY): Payer: Self-pay

## 2023-04-02 ENCOUNTER — Ambulatory Visit: Admitting: Internal Medicine

## 2023-04-02 ENCOUNTER — Encounter: Payer: Self-pay | Admitting: Internal Medicine

## 2023-04-02 VITALS — BP 132/82 | HR 59 | Temp 97.8°F | Resp 16 | Ht 65.0 in | Wt 164.4 lb

## 2023-04-02 DIAGNOSIS — E785 Hyperlipidemia, unspecified: Secondary | ICD-10-CM

## 2023-04-02 DIAGNOSIS — I10 Essential (primary) hypertension: Secondary | ICD-10-CM

## 2023-04-02 DIAGNOSIS — E118 Type 2 diabetes mellitus with unspecified complications: Secondary | ICD-10-CM | POA: Diagnosis not present

## 2023-04-02 DIAGNOSIS — Z23 Encounter for immunization: Secondary | ICD-10-CM | POA: Diagnosis not present

## 2023-04-02 DIAGNOSIS — R001 Bradycardia, unspecified: Secondary | ICD-10-CM | POA: Diagnosis not present

## 2023-04-02 LAB — CBC WITH DIFFERENTIAL/PLATELET
Basophils Absolute: 0 10*3/uL (ref 0.0–0.1)
Basophils Relative: 0.7 % (ref 0.0–3.0)
Eosinophils Absolute: 0.4 10*3/uL (ref 0.0–0.7)
Eosinophils Relative: 6.6 % — ABNORMAL HIGH (ref 0.0–5.0)
HCT: 42 % (ref 36.0–46.0)
Hemoglobin: 14.3 g/dL (ref 12.0–15.0)
Lymphocytes Relative: 38.6 % (ref 12.0–46.0)
Lymphs Abs: 2.5 10*3/uL (ref 0.7–4.0)
MCHC: 33.9 g/dL (ref 30.0–36.0)
MCV: 98.1 fl (ref 78.0–100.0)
Monocytes Absolute: 0.7 10*3/uL (ref 0.1–1.0)
Monocytes Relative: 11.3 % (ref 3.0–12.0)
Neutro Abs: 2.7 10*3/uL (ref 1.4–7.7)
Neutrophils Relative %: 42.8 % — ABNORMAL LOW (ref 43.0–77.0)
Platelets: 365 10*3/uL (ref 150.0–400.0)
RBC: 4.29 Mil/uL (ref 3.87–5.11)
RDW: 13.2 % (ref 11.5–15.5)
WBC: 6.4 10*3/uL (ref 4.0–10.5)

## 2023-04-02 LAB — BASIC METABOLIC PANEL
BUN: 23 mg/dL (ref 6–23)
CO2: 30 meq/L (ref 19–32)
Calcium: 9.8 mg/dL (ref 8.4–10.5)
Chloride: 100 meq/L (ref 96–112)
Creatinine, Ser: 0.79 mg/dL (ref 0.40–1.20)
GFR: 79.68 mL/min (ref >60.00)
Glucose, Bld: 111 mg/dL — ABNORMAL HIGH (ref 70–99)
Potassium: 4.4 meq/L (ref 3.5–5.1)
Sodium: 137 meq/L (ref 135–145)

## 2023-04-02 LAB — URINALYSIS, ROUTINE W REFLEX MICROSCOPIC
Bilirubin Urine: NEGATIVE
Hgb urine dipstick: NEGATIVE
Ketones, ur: NEGATIVE
Leukocytes,Ua: NEGATIVE
Nitrite: NEGATIVE
RBC / HPF: NONE SEEN (ref 0–?)
Specific Gravity, Urine: 1.01 (ref 1.000–1.030)
Total Protein, Urine: NEGATIVE
Urine Glucose: >=1000 — AB
Urobilinogen, UA: 0.2 (ref 0.0–1.0)
pH: 6 (ref 5.0–8.0)

## 2023-04-02 LAB — HEPATIC FUNCTION PANEL
ALT: 19 U/L (ref 0–35)
AST: 16 U/L (ref 0–37)
Albumin: 4.7 g/dL (ref 3.5–5.2)
Alkaline Phosphatase: 69 U/L (ref 39–117)
Bilirubin, Direct: 0.1 mg/dL (ref 0.0–0.3)
Total Bilirubin: 0.4 mg/dL (ref 0.2–1.2)
Total Protein: 7.7 g/dL (ref 6.0–8.3)

## 2023-04-02 LAB — TSH: TSH: 1.87 u[IU]/mL (ref 0.35–5.50)

## 2023-04-02 LAB — MICROALBUMIN / CREATININE URINE RATIO
Creatinine,U: 61.2 mg/dL
Microalb Creat Ratio: UNDETERMINED mg/g (ref 0.0–30.0)
Microalb, Ur: <0.7 mg/dL

## 2023-04-02 NOTE — Patient Instructions (Signed)
 Bradycardia, Adult Bradycardia is a slower-than-normal heartbeat. A normal resting heart rate for an adult ranges from 60 to 100 beats per minute. With bradycardia, the resting heart rate is less than 60 beats per minute. Bradycardia can prevent enough oxygen from reaching certain areas of your body when you are active. It can be serious if it keeps enough oxygen from reaching your brain and other parts of your body. Bradycardia is not a problem for everyone. For some healthy adults, a slow resting heart rate is normal. What are the causes? This condition may be caused by: A problem with the heart, including: A problem with the heart's electrical system, such as a heart block. With a heart block, electrical signals between the chambers of the heart are partially or completely blocked, so they are not able to work as they should. A problem with the heart's natural pacemaker (sinus node). Heart disease. A heart attack. Heart damage. Lyme disease. A heart infection. A heart condition that is present at birth (congenital heart defect). Certain medicines that treat heart conditions. Certain conditions, such as hypothyroidism and obstructive sleep apnea. Problems with the balance of chemicals and other substances, like potassium, in the blood. Trauma. Radiation therapy. What increases the risk? You are more likely to develop this condition if you: Are age 51 or older. Have high blood pressure (hypertension), high cholesterol (hyperlipidemia), or diabetes. Drink heavily, use tobacco or nicotine products, or use drugs. What are the signs or symptoms? Symptoms of this condition include: Light-headedness. Feeling faint or fainting. Fatigue and weakness. Trouble with activity or exercise. Shortness of breath. Chest pain (angina). Drowsiness. Confusion. Dizziness. How is this diagnosed? This condition may be diagnosed based on: Your symptoms. Your medical history. A physical exam. During  the exam, your health care provider will listen to your heartbeat and check your pulse. To confirm the diagnosis, your health care provider may order tests, such as: Blood tests. An electrocardiogram (ECG). This test records the heart's electrical activity. The test can show how fast your heart is beating and whether the heartbeat is steady. A test in which you wear a portable device (event recorder or Holter monitor) to record your heart's electrical activity while you go about your day. An exercise test. How is this treated? Treatment for this condition depends on the cause of the condition and how severe your symptoms are. Treatment may involve: Treatment of the underlying condition. Changing your medicines or how much medicine you take. Having a small, battery-operated device called a pacemaker implanted under the skin. When bradycardia occurs, this device can be used to increase your heart rate and help your heart beat in a regular rhythm. Follow these instructions at home: Lifestyle Manage any health conditions that contribute to bradycardia as told by your health care provider. Follow a heart-healthy diet. A nutrition specialist (dietitian) can help educate you about healthy food options and changes. Follow an exercise program that is approved by your health care provider. Maintain a healthy weight. Try to reduce or manage your stress, such as with yoga or meditation. If you need help reducing stress, ask your health care provider. Do not use any products that contain nicotine or tobacco. These products include cigarettes, chewing tobacco, and vaping devices, such as e-cigarettes. If you need help quitting, ask your health care provider. Do not use illegal drugs. Alcohol use If you drink alcohol: Limit how much you have to: 0-1 drink a day for women who are not pregnant. 0-2 drinks a day  for men. Know how much alcohol is in a drink. In the [place] one drink equals one 12 oz bottle of  beer (355 mL), one 5 oz glass of wine (148 mL), or one 1 oz glass of hard liquor (44 mL). General instructions Take over-the-counter and prescription medicines only as told by your health care provider. Keep all follow-up visits. This is important. How is this prevented? In some cases, bradycardia may be prevented by: Treating underlying medical problems. Stopping behaviors or medicines that can trigger the condition. Contact a health care provider if: You feel light-headed or dizzy. You almost faint. You feel weak or are easily fatigued during physical activity. You experience confusion or have memory problems. Get help right away if: You faint. You have chest pains or an irregular heartbeat (palpitations). You have trouble breathing. These symptoms may represent a serious problem that is an emergency. Do not wait to see if the symptoms will go away. Get medical help right away. Call your local emergency services (911 in the [place] Do not drive yourself to the hospital. Summary Bradycardia is a slower-than-normal heartbeat. With bradycardia, the resting heart rate is less than 60 beats per minute. Treatment for this condition depends on the cause. Manage any health conditions that contribute to bradycardia as told by your health care provider. Do not use any products that contain nicotine or tobacco. These products include cigarettes, chewing tobacco, and vaping devices, such as e-cigarettes. Keep all follow-up visits. This is important. This information is not intended to replace advice given to you by your health care provider. Make sure you discuss any questions you have with your health care provider. Document Revised: 04/29/2020 Document Reviewed: 04/29/2020 Elsevier Patient Education  2024 [employer].

## 2023-04-02 NOTE — Progress Notes (Unsigned)
 Subjective:  Patient ID: [name], female    DOB: 09/11/1960  Age: 63 y.o. MRN: [id number]  CC: Hypertension, Diabetes, and Hyperlipidemia   HPI [name] presents for f/up ----  Discussed the use of AI scribe software for clinical note transcription with the patient, who gave verbal consent to proceed.  History of Present Illness   The patient presents for follow-up of diabetes management and blood pressure monitoring.  She has been managing her diabetes for the past year and a half. Her last two blood glucose readings were good, but she has experienced some higher blood sugar levels since January, although they are not significantly elevated. She is scheduled for another workup in June. No chest pain, shortness of breath, dizziness, lightheadedness, or fatigue during physical activity.  Her blood pressure has been running low. In January, her blood pressure was recorded at 99/59 mmHg, and she stopped taking ramipril. After a week, her blood pressure increased, so she resumed taking ramipril and monitored it for a couple of weeks, during which it remained stable. She does not check her blood pressure daily. She occasionally experiences mild dizziness, which she attributes to nerves, but it is not severe enough to cause concern. No swelling in her legs or feet.  She is unsure if she received a flu shot last fall, as her records suggest she did not, but she thought she had.       Outpatient Medications Prior to Visit  Medication Sig Dispense Refill   ALPRAZolam (XANAX) 0.5 MG tablet Take 1 tablet (0.5 mg total) by mouth at bedtime as needed for anxiety. 30 tablet 2   atorvastatin (LIPITOR) 10 MG tablet Take 1 tablet (10 mg total) by mouth daily. 90 tablet 1   buPROPion (WELLBUTRIN XL) 300 MG 24 hr tablet Take 1 tablet (300 mg total) by mouth daily. 90 tablet 1   citalopram (CELEXA) 40 MG tablet Take 1 tablet (40 mg total) by mouth daily. 90 tablet 1   co-enzyme Q-10 30 MG  capsule Take 30 mg by mouth daily.     Cyanocobalamin (VITAMIN B12) 1000 MCG TBCR Take by mouth.     dapagliflozin propanediol (FARXIGA) 10 MG TABS tablet Take 1 tablet (10 mg total) by mouth daily before breakfast. 90 tablet 1   loratadine (CLARITIN) 10 MG tablet      pantoprazole (PROTONIX) 20 MG tablet Take 1 tablet (20 mg) by mouth daily. 90 tablet 1   ramipril (ALTACE) 2.5 MG capsule Take 1 capsule (2.5 mg total) by mouth daily. 90 capsule 0   VITAMIN D PO Take 5,000 Units by mouth.     Continuous Glucose Receiver (DEXCOM G7 RECEIVER) DEVI Use as directed. (Patient not taking: Reported on 04/02/2023) 9 each 1   Continuous Glucose Sensor (DEXCOM G7 SENSOR) MISC Attach to skin as directed. Replace every 10 days. (Patient not taking: Reported on 04/02/2023) 3 each 3   OVER THE COUNTER MEDICATION GI Microb-X 1 tablet daily (Patient not taking: Reported on 04/02/2023)     OVER THE COUNTER MEDICATION Fodmate 1-2 capsules with each meal (Patient not taking: Reported on 04/02/2023)     dicyclomine (BENTYL) 10 MG capsule Take 1 capsule (10 mg) by mouth 3 times daily as needed for spasms. 90 capsule 1   ipratropium (ATROVENT) 0.06 % nasal spray Place 2 sprays into both nostrils 3 (three) times daily for 4 days. 15 mL 0   No facility-administered medications prior to visit.    ROS  Review of Systems  Constitutional: Negative.  Negative for appetite change, chills, diaphoresis, fatigue and fever.  HENT: Negative.    Eyes: Negative.   Respiratory: Negative.  Negative for chest tightness, shortness of breath and wheezing.   Cardiovascular:  Negative for chest pain, palpitations and leg swelling.  Gastrointestinal: Negative.  Negative for abdominal pain, constipation, diarrhea, nausea and vomiting.  Genitourinary: Negative.  Negative for difficulty urinating.  Musculoskeletal: Negative.  Negative for arthralgias and myalgias.  Skin: Negative.   Neurological:  Negative for dizziness and weakness.   Hematological:  Negative for adenopathy. Does not bruise/bleed easily.  Psychiatric/Behavioral: Negative.      Objective:  BP 132/82 (BP Location: Left Arm, Patient Position: Sitting, Cuff Size: Normal)   Pulse (!) 59   Temp 97.8 F (36.6 C) (Oral)   Resp 16   Ht 5\' 5"  (1.651 m)   Wt 164 lb 6.4 oz (74.6 kg)   LMP 02/02/2013   SpO2 98%   BMI 27.36 kg/m   BP Readings from Last 3 Encounters:  04/02/23 132/82  01/27/23 119/74  12/31/22 (!) 90/58    Wt Readings from Last 3 Encounters:  04/02/23 164 lb 6.4 oz (74.6 kg)  01/27/23 168 lb (76.2 kg)  12/31/22 167 lb 6.4 oz (75.9 kg)    Physical Exam Vitals reviewed.  Constitutional:      Appearance: Normal appearance.  HENT:     Nose: Nose normal.     Mouth/Throat:     Mouth: Mucous membranes are moist.  Eyes:     General: No scleral icterus.    Conjunctiva/sclera: Conjunctivae normal.  Cardiovascular:     Rate and Rhythm: Regular rhythm. Bradycardia present.     Pulses: Normal pulses.     Heart sounds: No murmur heard.    No friction rub. No gallop.     Comments: EKG--  SB, 59 bpm No LVH, Q waves, or ST/T wave changes  Pulmonary:     Effort: Pulmonary effort is normal.     Breath sounds: No stridor. No wheezing, rhonchi or rales.  Abdominal:     General: Abdomen is flat.     Palpations: There is no mass.     Tenderness: There is no abdominal tenderness. There is no guarding.     Hernia: No hernia is present.  Musculoskeletal:        General: Normal range of motion.     Cervical back: Neck supple.     Right lower leg: No edema.     Left lower leg: No edema.  Lymphadenopathy:     Cervical: No cervical adenopathy.  Skin:    General: Skin is warm and dry.     Findings: No rash.  Neurological:     General: No focal deficit present.     Mental Status: She is alert. Mental status is at baseline.  Psychiatric:        Mood and Affect: Mood normal.        Behavior: Behavior normal.     Lab Results   Component Value Date   WBC 6.4 04/02/2023   HGB 14.3 04/02/2023   HCT 42.0 04/02/2023   PLT 365.0 04/02/2023   GLUCOSE 111 (H) 04/02/2023   CHOL 139 06/25/2022   TRIG 44 06/25/2022   HDL 47 06/25/2022   LDLCALC 82 06/25/2022   ALT 19 04/02/2023   AST 16 04/02/2023   NA 137 04/02/2023   K 4.4 04/02/2023   CL 100 04/02/2023   CREATININE 0.79  04/02/2023   BUN 23 04/02/2023   CO2 30 04/02/2023   TSH 1.87 04/02/2023   INR 1.0 09/18/2006   HGBA1C 6.4 12/31/2022   MICROALBUR <0.7 04/02/2023    CT CARDIAC SCORING ([facility] LOCATIONS ONLY) Result Date: 08/27/2022 CLINICAL DATA:  Family history of heart disease.  Abnormal EKG. * Tracking Code: FCC * EXAM: CT CARDIAC CORONARY ARTERY CALCIUM SCORE TECHNIQUE: Non-contrast imaging through the heart was performed using prospective ECG gating. Image post processing was performed on an independent workstation, allowing for quantitative analysis of the heart and coronary arteries. Note that this exam targets the heart and the chest was not imaged in its entirety. COMPARISON:  05/25/2013 FINDINGS: CORONARY CALCIUM SCORES: Left Main: 0 LAD: 99.7 LCx: 0 RCA: 0 Total Agatston Score: 99.7 MESA database percentile: 86 AORTA MEASUREMENTS: Ascending Aorta: 3.7 cm Descending Aorta:2.2 cm OTHER FINDINGS: No pleural fluid. Right lower lobe 4 mm pulmonary nodule on 47/9 can be presumed benign and do/does not warrant imaging follow-up per Fleischner criteria. Subpleural lymph nodes along the right minor fissure can also be presumed benign. Aortic atherosclerosis. Normal heart size, without pericardial effusion. No imaged thoracic adenopathy. Normal imaged portions of the liver, spleen, stomach. No acute osseous abnormality. IMPRESSION: 1. Total Agatston score of 99.7, corresponding to 86th percentile for age, sex, and race based cohort. 2.  Aortic Atherosclerosis (ICD10-I70.0). Electronically Signed   By: Jeronimo Greaves M.D.   On: 08/27/2022 16:22    Assessment & Plan:   Need for immunization against influenza -     Flu vaccine trivalent PF, 6mos and older(Flulaval,Afluria,Fluarix,Fluzone)  Primary hypertension- BP is well controlled. EKG is negative for LVH. -     EKG 12-Lead -     Basic metabolic panel; Future -     CBC with Differential/Platelet; Future -     Urinalysis, Routine w reflex microscopic; Future -     TSH; Future  Type II diabetes mellitus with manifestations (HCC) -     Basic metabolic panel; Future -     Microalbumin / creatinine urine ratio; Future -     Urinalysis, Routine w reflex microscopic; Future  Hyperlipidemia with target LDL less than 100- LDL goal achieved. Doing well on the statin  -     Hepatic function panel; Future -     TSH; Future  Encounter for immunization -     Flu vaccine trivalent PF, 6mos and older(Flulaval,Afluria,Fluarix,Fluzone)  Bradycardia- She is asx with this. -     TSH; Future     Follow-up: Return in about 6 months (around 10/03/2023).  Sanda Linger, MD

## 2023-04-04 ENCOUNTER — Encounter: Payer: Self-pay | Admitting: Internal Medicine

## 2023-04-04 DIAGNOSIS — R001 Bradycardia, unspecified: Secondary | ICD-10-CM | POA: Insufficient documentation

## 2023-04-07 ENCOUNTER — Other Ambulatory Visit: Payer: Self-pay | Admitting: Internal Medicine

## 2023-04-07 ENCOUNTER — Other Ambulatory Visit (HOSPITAL_COMMUNITY): Payer: Self-pay

## 2023-04-07 ENCOUNTER — Other Ambulatory Visit: Payer: Self-pay

## 2023-04-07 DIAGNOSIS — E785 Hyperlipidemia, unspecified: Secondary | ICD-10-CM

## 2023-04-07 MED ORDER — ATORVASTATIN CALCIUM 10 MG PO TABS
10.0000 mg | ORAL_TABLET | Freq: Every day | ORAL | 1 refills | Status: DC
  Filled 2023-04-07: qty 90, 90d supply, fill #0
  Filled 2023-07-15: qty 90, 90d supply, fill #1

## 2023-04-08 DIAGNOSIS — Z1231 Encounter for screening mammogram for malignant neoplasm of breast: Secondary | ICD-10-CM | POA: Diagnosis not present

## 2023-04-08 LAB — HM MAMMOGRAPHY

## 2023-04-09 ENCOUNTER — Encounter: Payer: Self-pay | Admitting: Internal Medicine

## 2023-04-21 ENCOUNTER — Ambulatory Visit: Payer: BC Managed Care – PPO | Admitting: Internal Medicine

## 2023-04-23 DIAGNOSIS — F4323 Adjustment disorder with mixed anxiety and depressed mood: Secondary | ICD-10-CM | POA: Diagnosis not present

## 2023-04-29 ENCOUNTER — Ambulatory Visit: Payer: BC Managed Care – PPO | Admitting: Internal Medicine

## 2023-04-29 ENCOUNTER — Other Ambulatory Visit (HOSPITAL_COMMUNITY): Payer: Self-pay

## 2023-04-29 ENCOUNTER — Encounter: Payer: Self-pay | Admitting: Internal Medicine

## 2023-04-29 VITALS — BP 120/74 | HR 79 | Ht 65.0 in | Wt 163.0 lb

## 2023-04-29 DIAGNOSIS — K625 Hemorrhage of anus and rectum: Secondary | ICD-10-CM

## 2023-04-29 DIAGNOSIS — Z8719 Personal history of other diseases of the digestive system: Secondary | ICD-10-CM

## 2023-04-29 DIAGNOSIS — Z860100 Personal history of colon polyps, unspecified: Secondary | ICD-10-CM

## 2023-04-29 DIAGNOSIS — R140 Abdominal distension (gaseous): Secondary | ICD-10-CM

## 2023-04-29 DIAGNOSIS — R1013 Epigastric pain: Secondary | ICD-10-CM | POA: Diagnosis not present

## 2023-04-29 DIAGNOSIS — K219 Gastro-esophageal reflux disease without esophagitis: Secondary | ICD-10-CM

## 2023-04-29 DIAGNOSIS — K602 Anal fissure, unspecified: Secondary | ICD-10-CM

## 2023-04-29 MED ORDER — AMBULATORY NON FORMULARY MEDICATION: 0 refills | Status: DC

## 2023-04-29 MED ORDER — DICYCLOMINE HCL 10 MG PO CAPS
10.0000 mg | ORAL_CAPSULE | Freq: Three times a day (TID) | ORAL | 0 refills | Status: AC | PRN
  Filled 2023-04-29: qty 90, 30d supply, fill #0

## 2023-04-29 NOTE — Progress Notes (Signed)
 04/29/2023 [name] [id number] 11/20/1960   HISTORY OF PRESENT ILLNESS: This is a 63 year old female presents for follow up of abdominal pain, GERD, and rectal bleeding  Interval History: She started having some rectal bleeding after passing a large BM about 2 months ago. She has continued to have intermittent scant amounts of rectal bleeding since that initial inciting event. Also endorses rectal pain. She hasn't tried anything yet for the rectal bleeding and pain. Stools are soft. Denies straining. Denies sitting on the toilet for a long time. Endorses 1-2 BMs per day on average, usually after drinking her coffee. She is still having some abdominal cramping on-and-off. The discomfort usually doesn't prevent her from doing her daily activities. She was given a generic antibiotic for a hand infection, which seemed to help with her symptoms temporarily. However, her symptoms recurred over the last 3 months, and she will have occasional stomach stuff happening again. She never took her rifaximin the second time. Her acid reflux is for the most part doing well on the lower dose of the pantoprazole and would like to stay at this dosage.   Wt Readings from Last 3 Encounters:  04/29/23 163 lb (73.9 kg)  04/02/23 164 lb 6.4 oz (74.6 kg)  01/27/23 168 lb (76.2 kg)    Outpatient Encounter Medications as of 04/29/2023  Medication Sig   ALPRAZolam (XANAX) 0.5 MG tablet Take 1 tablet (0.5 mg total) by mouth at bedtime as needed for anxiety.   atorvastatin (LIPITOR) 10 MG tablet Take 1 tablet (10 mg total) by mouth daily.   buPROPion (WELLBUTRIN XL) 300 MG 24 hr tablet Take 1 tablet (300 mg total) by mouth daily.   citalopram (CELEXA) 40 MG tablet Take 1 tablet (40 mg total) by mouth daily.   co-enzyme Q-10 30 MG capsule Take 30 mg by mouth daily.   Continuous Glucose Receiver (DEXCOM G7 RECEIVER) DEVI Use as directed.   Continuous Glucose Sensor (DEXCOM G7 SENSOR) MISC Attach to skin as  directed. Replace every 10 days.   Cyanocobalamin (VITAMIN B12) 1000 MCG TBCR Take by mouth.   dapagliflozin propanediol (FARXIGA) 10 MG TABS tablet Take 1 tablet (10 mg total) by mouth daily before breakfast.   loratadine (CLARITIN) 10 MG tablet    OVER THE COUNTER MEDICATION GI Microb-X 1 tablet daily   OVER THE COUNTER MEDICATION Fodmate 1-2 capsules with each meal   pantoprazole (PROTONIX) 20 MG tablet Take 1 tablet (20 mg) by mouth daily.   ramipril (ALTACE) 2.5 MG capsule Take 1 capsule (2.5 mg total) by mouth daily.   VITAMIN D PO Take 5,000 Units by mouth.   No facility-administered encounter medications on file as of 04/29/2023.   PHYSICAL EXAM: BP 120/74   Pulse 79   Ht 5\' 5"  (1.651 m)   Wt 163 lb (73.9 kg)   LMP 02/02/2013   BMI 27.12 kg/m  General: Well developed white female in no acute distress Head: Normocephalic and atraumatic Eyes:  Sclerae anicteric,conjunctive pink. Lungs: Clear throughout to auscultation; no W/R/R. Heart: Regular rate and rhythm; no M/R/G. Abdomen: Soft, non-distended.  BS present.  Non-tender. Rectal exam: Posterior anal fissure that was tender to palpation. Not tender to palpation within the rectum. Musculoskeletal: Symmetrical with no gross deformities  Skin: No lesions on visible extremities Extremities: No edema  Neurological: Alert oriented x 4, grossly non-focal Psychological:  Alert and cooperative. Normal mood and affect  Labs 09/2021: CBC with mildly elevated platelets of 403. LFTs nml.  Lipase nml. Amylase nml. ESR nml. CRP nml. Vit B12 and folate nml. Copper level nml.   Labs 01/2022: TTG IgA neg. TSH nml. Vit D nml. Iron/TIBC nml.   Labs 06/2022: CMP with mildly elevated glucose of 136. TSH nml. Vit D nml. Vit B12 nml. HbA1C 6.4%  Labs 03/2023: CBC with mildly elevated relative eosinophils. BMP nml. LFTs nml. TSH nml.   Gastric emptying study 12/16/21: IMPRESSION: Normal gastric emptying study.  EGD 11/11/21: - Normal  esophagus. - Four gastric polyps. Resected and retrieved. - Erythematous mucosa in the antrum. Biopsied. - Two duodenal polyps. Resected and retrieved. Path: 1. Surgical [P], duodenal polyp bx's BENIGN GASTRIC HETEROTOPIA 2. Surgical [P], gastric REACTIVE GASTROPATHY WITH MINIMAL CHRONIC GASTRITIS NEGATIVE FOR H. PYLORI, INTESTINAL METAPLASIA, DYSPLASIA AND CARCINOMA 3. Surgical [P], gastric polyps, polyp (4) BENIGN FUNDIC GLAND POLYP MINIMAL CHRONIC GASTRITIS WITH REACTIVE EPITHELIAL CHANGES NEGATIVE FOR H. PYLORI, INTESTINAL METAPLASIA, DYSPLASIA AND CARCINOMA  Colonoscopy 11/11/21: - The examined portion of the ileum was normal. - Two 3 to 4 mm polyps in the transverse colon and in the ascending colon, removed with a cold snare. Resected and retrieved. - Non-bleeding internal hemorrhoids. Path: 4. Surgical [P], colon, ascending, transverse, polyp (2) TUBULAR ADENOMA NEGATIVE FOR HIGH-GRADE DYSPLASIA AND CARCINOMA  ASSESSMENT AND PLAN: Rectal bleeding/pain due to anal fissure Epigastric ab pain Nausea GERD History of gastritis History of SIBO History of colon polyps Patient presents with rectal pain and rectal bleeding due to an anal fissure that was seen on rectal exam.  Patient does have history of internal hemorrhoids but these are not tender to palpation during exam so I would favor that her symptoms are most likely due to the anal fissure.  Will start treatment with diltiazem/lidocaine ointment.  Patient does continue to have some issues with intermittent epigastric abdominal discomfort and bloating.  She has been very responsive to antibiotics in the past, though her symptoms are not excessively bothersome at this time.  Thus we will hold off on any recurrent courses of antibiotics at this time.  I asked her to try Bentyl, Gas-X, and FDgard to see if these therapies may help with her symptoms.  Her GERD is doing well on the lower dose of pantoprazole. - Previously educated  on low FODMAP diet - Previously has responded well to antibiotics but symptoms are not excessively bothersome at this time - Diltiazem/lidocaine ointment - FDGard. Will give some samples today. - Cont pantoprazole 20 mg every day - Gas-X PRN - Can trial Bentyl 10 mg TID PRN for abdominal discomfort - Next colonoscopy is due in 10/2028 for polyp surveillance - RTC 3 months   I spent 33 minutes of time, including in depth chart review, independent review of results as outlined above, communicating results with the patient directly, face-to-face time with the patient, coordinating care, ordering studies and medications as appropriate, and documentation.

## 2023-04-29 NOTE — Patient Instructions (Addendum)
 We have sent a prescription for Diltiazem 2% gel  5% lidocaine  to [facility] for you. Using your index finger, apply a pea size amount of medication inside the rectum up to your first knuckle/joint 3 times daily x 8 weeks.  [facility]'s information is below: Address: [address], [place], [place] [address]  [phone]  *Please DO NOT go directly from our office to pick up this medication! Give the pharmacy 1 day to process the prescription as this is compounded and takes time to make.   We have sent the following medications to your pharmacy for you to pick up at your convenience: Bentyl  You are scheduled for a follow up visit on 08/03/23 at 10:10 am  If your blood pressure at your visit was 140/90 or greater, please contact your primary care physician to follow up on this.  _______________________________________________________  If you are age 19 or older, your body mass index should be between 23-30. Your Body mass index is 27.12 kg/m. If this is out of the aforementioned range listed, please consider follow up with your Primary Care Provider.  If you are age 50 or younger, your body mass index should be between 19-25. Your Body mass index is 27.12 kg/m. If this is out of the aformentioned range listed, please consider follow up with your Primary Care Provider.   ________________________________________________________  The [facility] providers would like to encourage you to use [facility] to communicate with providers for non-urgent requests or questions.  Due to long hold times on the telephone, sending your provider a message by [facility] may be a faster and more efficient way to get a response.  Please allow 48 business hours for a response.  Please remember that this is for non-urgent requests.  _______________________________________________________  Thank you for entrusting me with your care and for choosing [facility],  Dr. Eulah Pont

## 2023-05-04 DIAGNOSIS — F4323 Adjustment disorder with mixed anxiety and depressed mood: Secondary | ICD-10-CM | POA: Diagnosis not present

## 2023-05-06 ENCOUNTER — Other Ambulatory Visit: Payer: Self-pay

## 2023-05-12 DIAGNOSIS — Z713 Dietary counseling and surveillance: Secondary | ICD-10-CM | POA: Diagnosis not present

## 2023-05-19 DIAGNOSIS — F4323 Adjustment disorder with mixed anxiety and depressed mood: Secondary | ICD-10-CM | POA: Diagnosis not present

## 2023-06-02 ENCOUNTER — Other Ambulatory Visit (HOSPITAL_COMMUNITY): Payer: Self-pay

## 2023-06-02 ENCOUNTER — Ambulatory Visit: Payer: BC Managed Care – PPO | Admitting: Adult Health

## 2023-06-02 ENCOUNTER — Encounter: Payer: Self-pay | Admitting: Adult Health

## 2023-06-02 DIAGNOSIS — F418 Other specified anxiety disorders: Secondary | ICD-10-CM

## 2023-06-02 DIAGNOSIS — F325 Major depressive disorder, single episode, in full remission: Secondary | ICD-10-CM

## 2023-06-02 DIAGNOSIS — F5102 Adjustment insomnia: Secondary | ICD-10-CM

## 2023-06-02 MED ORDER — BUPROPION HCL ER (XL) 300 MG PO TB24
300.0000 mg | ORAL_TABLET | Freq: Every day | ORAL | 1 refills | Status: DC
  Filled 2023-06-02: qty 90, 90d supply, fill #0

## 2023-06-02 MED ORDER — CITALOPRAM HYDROBROMIDE 40 MG PO TABS
40.0000 mg | ORAL_TABLET | Freq: Every day | ORAL | 1 refills | Status: DC
  Filled 2023-06-02 – 2023-06-15 (×2): qty 90, 90d supply, fill #0

## 2023-06-02 MED ORDER — ALPRAZOLAM 0.5 MG PO TABS
0.5000 mg | ORAL_TABLET | Freq: Every evening | ORAL | 2 refills | Status: DC | PRN
  Filled 2023-06-02: qty 30, 30d supply, fill #0
  Filled 2023-09-01: qty 30, 30d supply, fill #1

## 2023-06-02 NOTE — Progress Notes (Signed)
 MYLENE LUEVANO [id number] 05/21/1960 63 y.o.  Subjective:   Patient ID:  ALWILDA BENTLEY is a 63 y.o. (DOB 08-04-1960) female.  Chief Complaint: No chief complaint on file.   HPI LEIANA KESTNER presents to the office today for follow-up of MDD, situational anxiety and adjustment insomnia.  Working with Edra Govern - therapist since 2019.  Describes mood today as "better". Pleasant. Denies tearfulness. Mood symptoms - denies depression. Reports stable interest and motivation. Reports periods of higher anxiety throughout the day. Feels like she is thinking about things she needs to do which triggers the anxiety. Reports irritability at times. Denies panic attacks. Reports being fearful at times - "a little bit of that". Continues to grieve loss of husband - passed away unexpectedly in May 01, 2023. Reports some worry, rumination and over thinking. Mood is more consistent. Stating "overall I feel like I'm functioning and getting things done". Feels like the Wellbutrin  and Celexa  are helpful for mood symptoms. Reports taking Xanax  as needed for sleep. Taking medications as prescribed.  Energy levels improved. Active, has a regular exercise routine.   Enjoys some usual interests and activities. Lives alone with 2 dogs. Brother living with her temporarily. Spending time with family - upcoming family beach trip. Appetite adequate. Weight stable. Sleeps better some nights than others. Averages 6 to 7 hours.  Reports focus and concentration stable. Completing tasks. Managing aspects of household. Retired. Denies SI or HI.  Denies AH or VH. Denies self harm. Denies substance use.  Previous medication trials: Wellbutrin , Celexa    GAD-7    Flowsheet Row Office Visit from 06/19/2022 in [facility]  Total GAD-7 Score 21      PHQ2-9    Flowsheet Row Office Visit from 04/02/2023 in [facility] Office Visit from 08/06/2022 in [facility] Office Visit from 06/19/2022 in [facility] Office Visit from 08/21/2021 in [facility] Visit from 09/10/2020 in [facility]  PHQ-2 Total Score 0 4 0 0 0  PHQ-9 Total Score 0 8 -- 5 0      Flowsheet Row Admission (Discharged) from 05/09/2020 in [facility]  C-SSRS RISK CATEGORY No Risk        Review of Systems:  Review of Systems  Musculoskeletal:  Negative for gait problem.  Neurological:  Negative for tremors.  Psychiatric/Behavioral:         Please refer to HPI    Medications: I have reviewed the patient's current medications.  Current Outpatient Medications  Medication Sig Dispense Refill   ALPRAZolam  (XANAX ) 0.5 MG tablet Take 1 tablet (0.5 mg total) by mouth at bedtime as needed for anxiety. 30 tablet 2   AMBULATORY NON FORMULARY MEDICATION Medication Name: Diltiazem 2% gel with 5% lidocaine   Using your index finger, apply a pea size amount of medication inside the rectum up to your first knuckle/joint 3 times daily x 8 weeks. 30 mg 0   atorvastatin  (LIPITOR) 10 MG tablet Take 1 tablet (10 mg total) by mouth daily. 90 tablet 1   buPROPion  (WELLBUTRIN  XL) 300 MG 24 hr tablet Take 1 tablet (300 mg total) by mouth daily. 90 tablet 1   citalopram  (CELEXA ) 40 MG tablet Take 1 tablet (40 mg total) by mouth daily. 90 tablet 1   co-enzyme Q-10 30 MG capsule Take 30 mg by mouth daily.  Continuous Glucose Receiver (DEXCOM G7 RECEIVER) DEVI Use as directed. 9 each 1   Continuous Glucose Sensor (DEXCOM G7 SENSOR) MISC Attach to skin as directed. Replace every 10 days. 3 each 3   Cyanocobalamin  (VITAMIN B12) 1000 MCG TBCR Take by mouth.     dapagliflozin  propanediol (FARXIGA ) 10 MG TABS tablet Take 1 tablet (10 mg total) by mouth daily before breakfast. 90 tablet 1   dicyclomine  (BENTYL ) 10 MG capsule Take 1 capsule (10 mg total) by mouth 3  (three) times daily as needed. 90 capsule 0   loratadine (CLARITIN) 10 MG tablet      OVER THE COUNTER MEDICATION GI Microb-X 1 tablet daily     OVER THE COUNTER MEDICATION Fodmate 1-2 capsules with each meal     pantoprazole  (PROTONIX ) 20 MG tablet Take 1 tablet (20 mg) by mouth daily. 90 tablet 1   ramipril  (ALTACE ) 2.5 MG capsule Take 1 capsule (2.5 mg total) by mouth daily. 90 capsule 0   VITAMIN D  PO Take 5,000 Units by mouth.     No current facility-administered medications for this visit.    Medication Side Effects: None  Allergies:  Allergies  Allergen Reactions   Flagyl [Metronidazole] Diarrhea   Metformin And Related Shortness Of Breath    Past Medical History:  Diagnosis Date   Depression    Diabetes mellitus without complication (HCC)    GERD (gastroesophageal reflux disease)    Hypertension    Sleep apnea     Past Medical History, Surgical history, Social history, and Family history were reviewed and updated as appropriate.   Please see review of systems for further details on the patient's review from today.   Objective:   Physical Exam:  LMP 02/02/2013   Physical Exam Constitutional:      General: She is not in acute distress. Musculoskeletal:        General: No deformity.  Neurological:     Mental Status: She is alert and oriented to person, place, and time.     Coordination: Coordination normal.  Psychiatric:        Attention and Perception: Attention and perception normal. She does not perceive auditory or visual hallucinations.        Mood and Affect: Mood normal. Mood is not anxious or depressed. Affect is not labile, blunt, angry or inappropriate.        Speech: Speech normal.        Behavior: Behavior normal.        Thought Content: Thought content normal. Thought content is not paranoid or delusional. Thought content does not include homicidal or suicidal ideation. Thought content does not include homicidal or suicidal plan.        Cognition  and Memory: Cognition and memory normal.        Judgment: Judgment normal.     Comments: Insight intact     Lab Review:     Component Value Date/Time   NA 137 04/02/2023 1202   NA 139 06/25/2022 0850   K 4.4 04/02/2023 1202   CL 100 04/02/2023 1202   CO2 30 04/02/2023 1202   GLUCOSE 111 (H) 04/02/2023 1202   BUN 23 04/02/2023 1202   BUN 21 06/25/2022 0850   CREATININE 0.79 04/02/2023 1202   CALCIUM  9.8 04/02/2023 1202   PROT 7.7 04/02/2023 1202   PROT 6.4 06/25/2022 0850   ALBUMIN 4.7 04/02/2023 1202   ALBUMIN 4.2 06/25/2022 0850   AST 16 04/02/2023 1202   ALT 19 04/02/2023  1202   ALKPHOS 69 04/02/2023 1202   BILITOT 0.4 04/02/2023 1202   BILITOT <0.2 06/25/2022 0850   GFRNONAA >60 05/07/2020 1430   GFRAA >60 08/06/2017 1924       Component Value Date/Time   WBC 6.4 04/02/2023 1202   RBC 4.29 04/02/2023 1202   HGB 14.3 04/02/2023 1202   HCT 42.0 04/02/2023 1202   PLT 365.0 04/02/2023 1202   MCV 98.1 04/02/2023 1202   MCH 31.8 02/05/2014 2146   MCHC 33.9 04/02/2023 1202   RDW 13.2 04/02/2023 1202   LYMPHSABS 2.5 04/02/2023 1202   MONOABS 0.7 04/02/2023 1202   EOSABS 0.4 04/02/2023 1202   BASOSABS 0.0 04/02/2023 1202    No results found for: "POCLITH", "LITHIUM"   No results found for: "PHENYTOIN", "PHENOBARB", "VALPROATE", "CBMZ"   .res Assessment: Plan:    Treatment Plan/Recommendations:  Current medications: Wellbutrin  XL300mg  Celexa  40mg  daily Xanax  0.5mg  at hs for sleep  RTC 3 months  25 minutes spent dedicated to the care of this patient on the date of this encounter to include pre-visit review of records, ordering of medication, post visit documentation, and face-to-face time with the patient discussing MDD, situational anxiety and adjustment insomnia. Discussed continuing current medication regimen.   Discussed potential benefits, risk, and side effects of benzodiazepines to include potential risk of tolerance and dependence, as well as  possible drowsiness.  Advised patient not to drive if experiencing drowsiness and to take lowest possible effective dose to minimize risk of dependence and tolerance.   Diagnoses and all orders for this visit:  Major depression, single episode, in complete remission (HCC)  Situational anxiety  Adjustment insomnia     Please see After Visit Summary for patient specific instructions.  Future Appointments  Date Time Provider Department Center  07/06/2023  3:00 PM Nida, Gebreselassie W, MD [facility] None  07/27/2023  1:30 PM Patel, Donika K, DO [facility] None  08/03/2023 10:10 AM Daina Drum, MD [facility]    No orders of the defined types were placed in this encounter.   -------------------------------

## 2023-06-04 ENCOUNTER — Other Ambulatory Visit (HOSPITAL_COMMUNITY): Payer: Self-pay

## 2023-06-04 ENCOUNTER — Other Ambulatory Visit: Payer: Self-pay | Admitting: "Endocrinology

## 2023-06-04 ENCOUNTER — Other Ambulatory Visit: Payer: Self-pay

## 2023-06-04 DIAGNOSIS — I10 Essential (primary) hypertension: Secondary | ICD-10-CM

## 2023-06-04 DIAGNOSIS — E118 Type 2 diabetes mellitus with unspecified complications: Secondary | ICD-10-CM

## 2023-06-04 DIAGNOSIS — F4323 Adjustment disorder with mixed anxiety and depressed mood: Secondary | ICD-10-CM | POA: Diagnosis not present

## 2023-06-04 DIAGNOSIS — E1142 Type 2 diabetes mellitus with diabetic polyneuropathy: Secondary | ICD-10-CM

## 2023-06-04 MED ORDER — RAMIPRIL 2.5 MG PO CAPS
2.5000 mg | ORAL_CAPSULE | Freq: Every day | ORAL | 0 refills | Status: DC
  Filled 2023-06-04: qty 90, 90d supply, fill #0

## 2023-06-04 MED ORDER — DAPAGLIFLOZIN PROPANEDIOL 10 MG PO TABS
10.0000 mg | ORAL_TABLET | Freq: Every day | ORAL | 0 refills | Status: DC
  Filled 2023-06-04: qty 90, 90d supply, fill #0

## 2023-06-05 ENCOUNTER — Other Ambulatory Visit: Payer: Self-pay

## 2023-06-05 ENCOUNTER — Other Ambulatory Visit (HOSPITAL_COMMUNITY): Payer: Self-pay

## 2023-06-08 ENCOUNTER — Other Ambulatory Visit (HOSPITAL_COMMUNITY): Payer: Self-pay

## 2023-06-15 ENCOUNTER — Other Ambulatory Visit (HOSPITAL_COMMUNITY): Payer: Self-pay

## 2023-06-16 ENCOUNTER — Other Ambulatory Visit (HOSPITAL_COMMUNITY): Payer: Self-pay

## 2023-06-16 ENCOUNTER — Other Ambulatory Visit: Payer: Self-pay

## 2023-06-23 DIAGNOSIS — F4323 Adjustment disorder with mixed anxiety and depressed mood: Secondary | ICD-10-CM | POA: Diagnosis not present

## 2023-06-26 DIAGNOSIS — Z794 Long term (current) use of insulin: Secondary | ICD-10-CM | POA: Diagnosis not present

## 2023-06-26 DIAGNOSIS — E1169 Type 2 diabetes mellitus with other specified complication: Secondary | ICD-10-CM | POA: Diagnosis not present

## 2023-06-30 LAB — CORTISOL-AM, BLOOD: Cortisol - AM: 20.5 ug/dL — ABNORMAL HIGH (ref 6.2–19.4)

## 2023-06-30 LAB — COMPREHENSIVE METABOLIC PANEL WITH GFR
ALT: 16 IU/L (ref 0–32)
AST: 15 IU/L (ref 0–40)
Albumin: 4.7 g/dL (ref 3.9–4.9)
Alkaline Phosphatase: 85 IU/L (ref 44–121)
BUN/Creatinine Ratio: 28 (ref 12–28)
BUN: 23 mg/dL (ref 8–27)
Bilirubin Total: 0.6 mg/dL (ref 0.0–1.2)
CO2: 24 mmol/L (ref 20–29)
Calcium: 9.7 mg/dL (ref 8.7–10.3)
Chloride: 101 mmol/L (ref 96–106)
Creatinine, Ser: 0.81 mg/dL (ref 0.57–1.00)
Globulin, Total: 2.5 g/dL (ref 1.5–4.5)
Glucose: 127 mg/dL — ABNORMAL HIGH (ref 70–99)
Potassium: 4.9 mmol/L (ref 3.5–5.2)
Sodium: 139 mmol/L (ref 134–144)
Total Protein: 7.2 g/dL (ref 6.0–8.5)
eGFR: 82 mL/min/{1.73_m2} (ref >59)

## 2023-06-30 LAB — LIPID PANEL
Chol/HDL Ratio: 2.9 ratio (ref 0.0–4.4)
Cholesterol, Total: 151 mg/dL (ref 100–199)
HDL: 52 mg/dL (ref >39)
LDL Chol Calc (NIH): 87 mg/dL (ref 0–99)
Triglycerides: 60 mg/dL (ref 0–149)
VLDL Cholesterol Cal: 12 mg/dL (ref 5–40)

## 2023-06-30 LAB — T4, FREE: Free T4: 0.99 ng/dL (ref 0.82–1.77)

## 2023-06-30 LAB — TSH: TSH: 2.43 u[IU]/mL (ref 0.450–4.500)

## 2023-07-06 ENCOUNTER — Ambulatory Visit: Payer: BC Managed Care – PPO | Admitting: "Endocrinology

## 2023-07-06 ENCOUNTER — Encounter: Payer: Self-pay | Admitting: "Endocrinology

## 2023-07-06 VITALS — BP 96/62 | HR 72 | Ht 65.0 in | Wt 163.0 lb

## 2023-07-06 DIAGNOSIS — Z794 Long term (current) use of insulin: Secondary | ICD-10-CM | POA: Diagnosis not present

## 2023-07-06 DIAGNOSIS — I10 Essential (primary) hypertension: Secondary | ICD-10-CM | POA: Diagnosis not present

## 2023-07-06 DIAGNOSIS — Z7984 Long term (current) use of oral hypoglycemic drugs: Secondary | ICD-10-CM | POA: Diagnosis not present

## 2023-07-06 DIAGNOSIS — E1169 Type 2 diabetes mellitus with other specified complication: Secondary | ICD-10-CM

## 2023-07-06 DIAGNOSIS — E782 Mixed hyperlipidemia: Secondary | ICD-10-CM

## 2023-07-06 LAB — POCT GLYCOSYLATED HEMOGLOBIN (HGB A1C): HbA1c, POC (controlled diabetic range): 6.4 % (ref 0.0–7.0)

## 2023-07-06 NOTE — Progress Notes (Signed)
 Endocrinology follow-up note       07/06/2023, 4:11 PM   Subjective:    Patient ID: [name], female    DOB: [birth year].  [name] is being seen in follow-up after she was seen in consultation for management of currently controlled asymptomatic diabetes requested by  Arcadio Knuckles, MD.   Past Medical History:  Diagnosis Date   Depression    Diabetes mellitus without complication (HCC)    GERD (gastroesophageal reflux disease)    Hypertension    Sleep apnea     Past Surgical History:  Procedure Laterality Date   COLONOSCOPY  2013   hypo plastic polyps at [facility] in [place]   COLONOSCOPY WITH ESOPHAGOGASTRODUODENOSCOPY (EGD)  11/11/2021   DIAGNOSTIC LAPAROSCOPY     DILATION AND CURETTAGE OF UTERUS N/A 05/09/2020   Procedure: CERVICAL DILATATION AND ENDOCERVICAL CURETTAGE;  Surgeon: Meldon Sport, DO;  Location: [facility];  Service: Gynecology;  Laterality: N/A;   LABIOPLASTY Left 08/10/2017   Procedure: LABIAPLASTY REVISION;  Surgeon: Johnn Najjar, MD;  Location: [facility];  Service: Gynecology;  Laterality: Left;  Labiaplasty revision   UPPER GASTROINTESTINAL ENDOSCOPY     WRIST SURGERY  11/2022    Social History   Socioeconomic History   Marital status: Widowed    Spouse name: Tonia Frankel   Number of children: 0   Years of education: Not on file   Highest education level: Not on file  Occupational History   Occupation: accountent  Tobacco Use   Smoking status: Former    Types: Cigarettes   Smokeless tobacco: Never   Tobacco comments:    Not every day smoker in the past  Vaping Use   Vaping status: Never Used  Substance and Sexual Activity   Alcohol use: Yes    Comment: rarely about 2 drinks per month   Drug use: Never   Sexual activity: Not Currently    Birth control/protection: Post-menopausal  Other Topics Concern   Not on file  Social History Narrative   Right handed     Lives in a one story home       Husband passed away unexpectedly 04-03-22   Social Drivers of Corporate investment banker Strain: Not on file  Food Insecurity: Not on file  Transportation Needs: Not on file  Physical Activity: Not on file  Stress: Not on file  Social Connections: Not on file    Family History  Problem Relation Age of Onset   Dementia Mother    Alzheimer's disease Mother    Hypertension Mother    Rheumatic fever Father    Bipolar disorder Sister    Diabetes Sister    High Cholesterol Sister    Bipolar disorder Brother    Depression Maternal Grandmother    Diabetes Maternal Grandfather    Pancreatic cancer Paternal Grandfather    Colon cancer Neg Hx    Colon polyps Neg Hx    Esophageal cancer Neg Hx    Rectal cancer Neg Hx    Stomach cancer Neg Hx     Outpatient Encounter Medications as of 07/06/2023  Medication Sig   ALPRAZolam  (XANAX ) 0.5 MG tablet Take 1 tablet (0.5 mg total) by mouth at bedtime as needed for anxiety.   AMBULATORY NON FORMULARY MEDICATION Medication Name: Diltiazem 2% gel with 5% lidocaine   Using your index finger, apply a pea size amount of medication inside the rectum up to your first knuckle/joint 3 times daily x  8 weeks.   atorvastatin  (LIPITOR) 10 MG tablet Take 1 tablet (10 mg total) by mouth daily.   buPROPion  (WELLBUTRIN  XL) 300 MG 24 hr tablet Take 1 tablet (300 mg total) by mouth daily.   citalopram  (CELEXA ) 40 MG tablet Take 1 tablet (40 mg total) by mouth daily.   co-enzyme Q-10 30 MG capsule Take 30 mg by mouth daily.   Continuous Glucose Receiver (DEXCOM G7 RECEIVER) DEVI Use as directed.   Continuous Glucose Sensor (DEXCOM G7 SENSOR) MISC Attach to skin as directed. Replace every 10 days.   Cyanocobalamin  (VITAMIN B12) 1000 MCG TBCR Take by mouth.   dapagliflozin  propanediol (FARXIGA ) 10 MG TABS tablet Take 1 tablet (10 mg total) by mouth daily before breakfast.   dicyclomine  (BENTYL ) 10 MG capsule Take 1 capsule (10 mg  total) by mouth 3 (three) times daily as needed.   loratadine (CLARITIN) 10 MG tablet    OVER THE COUNTER MEDICATION GI Microb-X 1 tablet daily   OVER THE COUNTER MEDICATION Fodmate 1-2 capsules with each meal   pantoprazole  (PROTONIX ) 20 MG tablet Take 1 tablet (20 mg) by mouth daily.   ramipril  (ALTACE ) 2.5 MG capsule Take 1 capsule (2.5 mg total) by mouth daily.   VITAMIN D  PO Take 5,000 Units by mouth.   No facility-administered encounter medications on file as of 07/06/2023.    ALLERGIES: Allergies  Allergen Reactions   Flagyl [Metronidazole] Diarrhea   Metformin And Related Shortness Of Breath    VACCINATION STATUS: Immunization History  Administered Date(s) Administered   Influenza, Seasonal, Injecte, Preservative Fre 04/02/2023   Influenza,inj,Quad PF,6+ Mos 10/08/2021   PFIZER Comirnaty(Gray Top)Covid-19 Tri-Sucrose Vaccine 05/28/2020   PFIZER(Purple Top)SARS-COV-2 Vaccination 03/30/2019, 04/20/2019, 11/23/2019   PNEUMOCOCCAL CONJUGATE-20 06/11/2020   Tdap 10/06/2008, 12/20/2018   Zoster Recombinant(Shingrix) 06/11/2020, 09/10/2020    Diabetes She presents for her follow-up diabetic visit. She has type 2 diabetes mellitus. Onset time: She was diagnosed at approximate age of 63 years. Her disease course has been stable. There are no hypoglycemic associated symptoms. Pertinent negatives for hypoglycemia include no confusion, headaches, pallor or seizures. There are no diabetic associated symptoms. Pertinent negatives for diabetes include no chest pain, no polydipsia, no polyphagia and no polyuria. There are no hypoglycemic complications. Symptoms are stable. There are no diabetic complications. Risk factors for coronary artery disease include dyslipidemia, diabetes mellitus, post-menopausal and tobacco exposure. Current diabetic treatments: She is currently on Tresiba  10 units daily, Farxiga  10 g p.o. daily. Her weight is fluctuating minimally. She is following a generally  unhealthy diet. When asked about meal planning, she reported none. She has not had a previous visit with a dietitian. She participates in exercise intermittently. Her home blood glucose trend is fluctuating minimally. Her breakfast blood glucose range is generally 130-140 mg/dl. Her lunch blood glucose range is generally 130-140 mg/dl. Her dinner blood glucose range is generally 130-140 mg/dl. Her overall blood glucose range is 130-140 mg/dl. (She continues to utilize her Dexcom CGM.  She maintains a great control of her glycemic profile with point-of-care A1c of 6.4%.  Her Dexcom report shows 90% time in range, 10% level 1 hyperglycemia. She has no hypoglycemia.  ) An ACE inhibitor/angiotensin II receptor blocker is being taken.  Hyperlipidemia This is a chronic problem. The current episode started more than 1 year ago. The problem is controlled. Exacerbating diseases include diabetes. Pertinent negatives include no chest pain, myalgias or shortness of breath. Current antihyperlipidemic treatment includes statins. Risk factors for coronary artery disease  include diabetes mellitus, dyslipidemia, post-menopausal and family history.     Objective:       07/06/2023    3:12 PM 04/29/2023   10:36 AM 04/02/2023   10:54 AM  Vitals with BMI  Height 5' 5 5' 5 5' 5  Weight 163 lbs 163 lbs 164 lbs 6 oz  BMI 27.12 27.12 27.36  Systolic 96 120 132  Diastolic 62 74 82  Pulse 72 79 59    BP 96/62   Pulse 72   Ht 5' 5 (1.651 m)   Wt 163 lb (73.9 kg)   LMP 02/02/2013   BMI 27.12 kg/m   Wt Readings from Last 3 Encounters:  07/06/23 163 lb (73.9 kg)  04/29/23 163 lb (73.9 kg)  04/02/23 164 lb 6.4 oz (74.6 kg)     CMP ( most recent) CMP     Component Value Date/Time   NA 139 06/26/2023 0748   K 4.9 06/26/2023 0748   CL 101 06/26/2023 0748   CO2 24 06/26/2023 0748   GLUCOSE 127 (H) 06/26/2023 0748   GLUCOSE 111 (H) 04/02/2023 1202   BUN 23 06/26/2023 0748   CREATININE 0.81 06/26/2023 0748    CALCIUM  9.7 06/26/2023 0748   PROT 7.2 06/26/2023 0748   ALBUMIN 4.7 06/26/2023 0748   AST 15 06/26/2023 0748   ALT 16 06/26/2023 0748   ALKPHOS 85 06/26/2023 0748   BILITOT 0.6 06/26/2023 0748   GFRNONAA >60 05/07/2020 1430   GFRAA >60 08/06/2017 1924     Diabetic Labs (most recent): Lab Results  Component Value Date   HGBA1C 6.4 07/06/2023   HGBA1C 6.4 12/31/2022   HGBA1C 6.4 06/26/2022   MICROALBUR <0.7 04/02/2023   MICROALBUR <0.7 08/06/2022   MICROALBUR <0.7 08/21/2021     Lipid Panel ( most recent) Lipid Panel     Component Value Date/Time   CHOL 151 06/26/2023 0748   TRIG 60 06/26/2023 0748   HDL 52 06/26/2023 0748   CHOLHDL 2.9 06/26/2023 0748   CHOLHDL 3 02/07/2021 1618   VLDL 16.6 02/07/2021 1618   LDLCALC 87 06/26/2023 0748   LABVLDL 12 06/26/2023 0748        Assessment & Plan:   1. Type 2 diabetes mellitus with other specified complication, with long-term current use of insulin  (HCC) - [name] has currently uncontrolled symptomatic type 2 DM since  63 years of age.  She continues to utilize her Dexcom CGM.  She maintains a great control of her glycemic profile with point-of-care A1c of 6.4%.  Her Dexcom report shows 90% time in range, 10% level 1 hyperglycemia. She has no hypoglycemia.     Recent labs reviewed. - I had a long discussion with her about the possible risk factors and  the pathology behind its diabetes and its complications. -her diabetes is complicated by comorbid hyperlipidemia and she remains at a high risk for more acute and chronic complications which include CAD, CVA, CKD, retinopathy, and neuropathy. These are all discussed in detail with her.  - I discussed all available options of managing her diabetes including de-escalation of medications. I have counseled her on Food as Medicine by adopting a Whole Food , Plant Predominant  ( WFPP) nutrition as recommended by [employer] of Lifestyle Medicine. Patient is  encouraged to switch to  unprocessed or minimally processed  complex starch, adequate protein intake (mainly plant source), minimal liquid fat, plenty of fruits, and vegetables. -  she is advised to stick to a  routine mealtimes to eat 3 complete meals a day and snack only when necessary ( to snack only to correct hypoglycemia BG <70 day time or <100 at night).  - she acknowledges that there is a room for improvement in her food and drink choices. - Suggestion is made for her to avoid simple carbohydrates  from her diet including Cakes, Sweet Desserts, Ice Cream, Soda (diet and regular), Sweet Tea, Candies, Chips, Cookies, Store Bought Juices, Alcohol , Artificial Sweeteners,  Coffee Creamer, and Sugar-free Products, Lemonade. This will help patient to have more stable blood glucose profile and potentially avoid unintended weight gain.  The following Lifestyle Medicine recommendations according to American College of Lifestyle Medicine  [facility]) were discussed and and offered to patient and she  agrees to start the journey:  A. Whole Foods, Plant-Based Nutrition comprising of fruits and vegetables, plant-based proteins, whole-grain carbohydrates was discussed in detail with the patient.   A list for source of those nutrients were also provided to the patient.  Patient will use only water or unsweetened tea for hydration. B.  The need to stay away from risky substances including alcohol, smoking; obtaining 7 to 9 hours of restorative sleep, at least 150 minutes of moderate intensity exercise weekly, the importance of healthy social connections,  and stress management techniques were discussed. C.  A full color page of  Calorie density of various food groups per pound showing examples of each food groups was provided to the patient.   - I have approached her with the following individualized plan to manage  her diabetes and patient agrees:   - she presents with controlled glycemic profile. - Based on her  glycemic response, she would not need insulin  treatment for now.  She is advised to continue Farxiga  10 mg p.o. daily at breakfast.    This patient has a chance to reverse her diabetes.  She is encouraged to continue to utilize her Dexcom CGM as long as she has access for it.  - she is encouraged to call clinic for blood glucose levels less than 70 or above 200 mg /dl. She will be considered for low-dose GLP-1 receptor agonists on subsequent visits if necessary.  - Specific targets for  A1c;  LDL, HDL,  and Triglycerides were discussed with the patient.  2) Blood Pressure /Hypertension: Her blood pressure is tightly controlled with occasional dizziness.  She is advised to hold lisinopril 2.5 mg daily until she sees her primary care doctor.  Her point-of-care A1c today was 96/62 mmHg.    3) Lipids/Hyperlipidemia:   Review of her recent lipid panel showed  controlled  LDL improving at 82.  She is advised to continue Lipitor 10 mg p.o. nightly.   .  Side effects and precautions discussed with her.  4)  Weight/Diet:  Body mass index is 27.12 kg/m.  -    she is  a candidate for some weight loss.  I discussed with her the fact that loss of 5 - 10% of her  current body weight will have the most impact on her diabetes management.  The above detailed  ACLM recommendations for nutrition, exercise, sleep, social life, avoidance of risky substances, the need for restorative sleep   information will also detailed on discharge instructions.  5) Chronic Care/Health Maintenance:  -she  is on ACEI/ARB and Statin medications and  is encouraged to initiate and continue to follow up with Ophthalmology, Dentist,  Podiatrist at least yearly or according to recommendations, and advised  to   stay away from smoking. I have recommended yearly flu vaccine and pneumonia vaccine at least every 5 years; moderate intensity exercise for up to 150 minutes weekly; and  sleep for 7- 9 hours a day.  She gives prior history of  osteoporosis, not currently on treatment.  She will have a repeat bone density before her next visit.     - she is  advised to maintain close follow up with Arcadio Knuckles, MD for primary care needs, as well as her other providers for optimal and coordinated care.  I spent  26  minutes in the care of the patient today including review of labs from CMP, Lipids, Thyroid  Function, Hematology (current and previous including abstractions from other facilities); face-to-face time discussing  her blood glucose readings/logs, discussing hypoglycemia and hyperglycemia episodes and symptoms, medications doses, her options of short and long term treatment based on the latest standards of care / guidelines;  discussion about incorporating lifestyle medicine;  and documenting the encounter. Risk reduction counseling performed per USPSTF guidelines to reduce cardiovascular risk factors.     Please refer to Patient Instructions for Blood Glucose Monitoring and Insulin /Medications Dosing Guide  in media tab for additional information. Please  also refer to  Patient Self Inventory in the Media  tab for reviewed elements of pertinent patient history.  [name] participated in the discussions, expressed understanding, and voiced agreement with the above plans.  All questions were answered to her satisfaction. she is encouraged to contact clinic should she have any questions or concerns prior to her return visit.   Follow up plan: - Return in about 6 months (around 01/05/2024) for DXA Scan B4 NV, A1c -NV.  Kalvin Orf, MD [facility] [address] [place], [place] [address] Phone: [phone]  Fax: [phone]    07/06/2023, 4:11 PM  This note was partially dictated with voice recognition software. Similar sounding words can be transcribed inadequately or may not  be corrected upon review.

## 2023-07-09 ENCOUNTER — Ambulatory Visit: Payer: Self-pay

## 2023-07-09 NOTE — Telephone Encounter (Signed)
 Patient has been scheduled for an appointment.

## 2023-07-09 NOTE — Telephone Encounter (Signed)
 FYI Only or Action Required?: FYI only for provider.  Patient was last seen in primary care on 04/02/2023 by Arcadio Knuckles, MD. Called Nurse Triage reporting Rash. Symptoms began several days ago. Interventions attempted: OTC medications: hydrocortisone cream. Symptoms are: gradually worsening.  Triage Disposition: Home Care  Patient/caregiver understands and will follow disposition?: Yes, will follow disposition  Copied from CRM [id number]. Topic: Clinical - Red Word Triage >> Jul 09, 2023  9:47 AM Alyse July wrote: Red Word that prompted transfer to Nurse Triage: possible allergic reaction, itchiness, swelling and swelling. Reason for Disposition  Mild localized rash  Answer Assessment - Initial Assessment Questions 1. APPEARANCE of RASH: Describe the rash.      Red, raised 2. LOCATION: Where is the rash located?      R upper arm 3. NUMBER: How many spots are there?      2 spots  4. SIZE: How big are the spots? (Inches, centimeters or compare to size of a coin)      Quarter and nickel 5. ONSET: When did the rash start?      4 days 6. ITCHING: Does the rash itch? If Yes, ask: How bad is the itch?  (Scale 0-10; or none, mild, moderate, severe)     Varies 7. PAIN: Does the rash hurt? If Yes, ask: How bad is the pain?  (Scale 0-10; or none, mild, moderate, severe)    - NONE (0): no pain    - MILD (1-3): doesn't interfere with normal activities     - MODERATE (4-7): interferes with normal activities or awakens from sleep     - SEVERE (8-10): excruciating pain, unable to do any normal activities     denies 8. OTHER SYMPTOMS: Do you have any other symptoms? (e.g., fever)     denies  Protocols used: Rash or Redness - Localized-A-AH Pt was gardening the day the rash started.

## 2023-07-10 ENCOUNTER — Encounter: Payer: Self-pay | Admitting: Family Medicine

## 2023-07-10 ENCOUNTER — Ambulatory Visit: Admitting: Family Medicine

## 2023-07-10 ENCOUNTER — Other Ambulatory Visit (HOSPITAL_COMMUNITY): Payer: Self-pay

## 2023-07-10 VITALS — BP 126/74 | HR 71 | Temp 98.4°F | Ht 65.0 in | Wt 164.8 lb

## 2023-07-10 DIAGNOSIS — R21 Rash and other nonspecific skin eruption: Secondary | ICD-10-CM

## 2023-07-10 DIAGNOSIS — Z7689 Persons encountering health services in other specified circumstances: Secondary | ICD-10-CM | POA: Diagnosis not present

## 2023-07-10 MED ORDER — TRIAMCINOLONE ACETONIDE 0.1 % EX CREA
1.0000 | TOPICAL_CREAM | Freq: Two times a day (BID) | CUTANEOUS | 0 refills | Status: DC
  Filled 2023-07-10: qty 30, 15d supply, fill #0

## 2023-07-10 MED ORDER — VALACYCLOVIR HCL 1 G PO TABS
1000.0000 mg | ORAL_TABLET | Freq: Three times a day (TID) | ORAL | 0 refills | Status: AC
  Filled 2023-07-10: qty 21, 7d supply, fill #0

## 2023-07-10 NOTE — Progress Notes (Signed)
 Subjective:     Patient ID: [name], female    DOB: [birth year], 63 y.o.   MRN: [id number]  Chief Complaint  Patient presents with   Acute Visit    HPI  History of Present Illness         With complaints of a painful, pruritic rash on her right upper arm x 5 days.  Initially she noticed blisters and oozing.  She has a history of shingles and thinks this could be a shingles rash.  States she is having issues sleeping recently.  States she feels like her anxiety and depression medications are not working as well. She sees Irving Mantle, NP for mental health   Health Maintenance Due  Topic Date Due   Cervical Cancer Screening (HPV/Pap Cotest)  01/30/2023   OPHTHALMOLOGY EXAM  06/10/2023    Past Medical History:  Diagnosis Date   Depression    Diabetes mellitus without complication (HCC)    GERD (gastroesophageal reflux disease)    Hypertension    Sleep apnea     Past Surgical History:  Procedure Laterality Date   COLONOSCOPY  2013   hypo plastic polyps at [facility] in [place]   COLONOSCOPY WITH ESOPHAGOGASTRODUODENOSCOPY (EGD)  11/11/2021   DIAGNOSTIC LAPAROSCOPY     DILATION AND CURETTAGE OF UTERUS N/A 05/09/2020   Procedure: CERVICAL DILATATION AND ENDOCERVICAL CURETTAGE;  Surgeon: Meldon Sport, DO;  Location: [facility];  Service: Gynecology;  Laterality: N/A;   LABIOPLASTY Left 08/10/2017   Procedure: LABIAPLASTY REVISION;  Surgeon: Johnn Najjar, MD;  Location:  SURGERY CENTER;  Service: Gynecology;  Laterality: Left;  Labiaplasty revision   UPPER GASTROINTESTINAL ENDOSCOPY     WRIST SURGERY  11/2022    Family History  Problem Relation Age of Onset   Dementia Mother    Alzheimer's disease Mother    Hypertension Mother    Rheumatic fever Father    Bipolar disorder Sister    Diabetes Sister    High Cholesterol Sister    Bipolar disorder Brother    Depression Maternal Grandmother    Diabetes Maternal Grandfather     Pancreatic cancer Paternal Grandfather    Colon cancer Neg Hx    Colon polyps Neg Hx    Esophageal cancer Neg Hx    Rectal cancer Neg Hx    Stomach cancer Neg Hx     Social History   Socioeconomic History   Marital status: Widowed    Spouse name: Tonia Frankel   Number of children: 0   Years of education: Not on file   Highest education level: Bachelor's degree (e.g., BA, AB, BS)  Occupational History   Occupation: accountent  Tobacco Use   Smoking status: Former    Types: Cigarettes   Smokeless tobacco: Never   Tobacco comments:    Not every day smoker in the past  Vaping Use   Vaping status: Never Used  Substance and Sexual Activity   Alcohol use: Yes    Comment: rarely about 2 drinks per month   Drug use: Never   Sexual activity: Not Currently    Birth control/protection: Post-menopausal  Other Topics Concern   Not on file  Social History Narrative   Right handed    Lives in a one story home       Husband passed away unexpectedly 03-Apr-2022   Social Drivers of Health   Financial Resource Strain: Low Risk  (07/09/2023)   Overall Financial Resource Strain (CARDIA)    Difficulty  of Paying Living Expenses: Not hard at all  Food Insecurity: No Food Insecurity (07/09/2023)   Hunger Vital Sign    Worried About Running Out of Food in the Last Year: Never true    Ran Out of Food in the Last Year: Never true  Transportation Needs: No Transportation Needs (07/09/2023)   PRAPARE - Administrator, Civil Service (Medical): No    Lack of Transportation (Non-Medical): No  Physical Activity: Sufficiently Active (07/09/2023)   Exercise Vital Sign    Days of Exercise per Week: 5 days    Minutes of Exercise per Session: 30 min  Stress: Stress Concern Present (07/09/2023)   [employer] of Occupational Health - Occupational Stress Questionnaire    Feeling of Stress: Very much  Social Connections: Moderately Integrated (07/09/2023)   Social Connection and Isolation  Panel    Frequency of Communication with Friends and Family: More than three times a week    Frequency of Social Gatherings with Friends and Family: More than three times a week    Attends Religious Services: More than 4 times per year    Active Member of [employer] or Organizations: Yes    Attends Banker Meetings: More than 4 times per year    Marital Status: Widowed  Intimate Partner Violence: Not on file    Outpatient Medications Prior to Visit  Medication Sig Dispense Refill   ALPRAZolam  (XANAX ) 0.5 MG tablet Take 1 tablet (0.5 mg total) by mouth at bedtime as needed for anxiety. 30 tablet 2   AMBULATORY NON FORMULARY MEDICATION Medication Name: Diltiazem 2% gel with 5% lidocaine   Using your index finger, apply a pea size amount of medication inside the rectum up to your first knuckle/joint 3 times daily x 8 weeks. 30 mg 0   atorvastatin  (LIPITOR) 10 MG tablet Take 1 tablet (10 mg total) by mouth daily. 90 tablet 1   buPROPion  (WELLBUTRIN  XL) 300 MG 24 hr tablet Take 1 tablet (300 mg total) by mouth daily. 90 tablet 1   citalopram  (CELEXA ) 40 MG tablet Take 1 tablet (40 mg total) by mouth daily. 90 tablet 1   co-enzyme Q-10 30 MG capsule Take 30 mg by mouth daily.     Continuous Glucose Receiver (DEXCOM G7 RECEIVER) DEVI Use as directed. 9 each 1   Continuous Glucose Sensor (DEXCOM G7 SENSOR) MISC Attach to skin as directed. Replace every 10 days. 3 each 3   Cyanocobalamin  (VITAMIN B12) 1000 MCG TBCR Take by mouth.     dapagliflozin  propanediol (FARXIGA ) 10 MG TABS tablet Take 1 tablet (10 mg total) by mouth daily before breakfast. 90 tablet 0   dicyclomine  (BENTYL ) 10 MG capsule Take 1 capsule (10 mg total) by mouth 3 (three) times daily as needed. 90 capsule 0   loratadine (CLARITIN) 10 MG tablet      OVER THE COUNTER MEDICATION GI Microb-X 1 tablet daily     OVER THE COUNTER MEDICATION Fodmate 1-2 capsules with each meal     pantoprazole  (PROTONIX ) 20 MG tablet Take 1 tablet  (20 mg) by mouth daily. 90 tablet 1   VITAMIN D  PO Take 5,000 Units by mouth.     ramipril  (ALTACE ) 2.5 MG capsule Take 1 capsule (2.5 mg total) by mouth daily. (Patient not taking: Reported on 07/10/2023) 90 capsule 0   No facility-administered medications prior to visit.    Allergies  Allergen Reactions   Flagyl [Metronidazole] Diarrhea   Metformin And Related Shortness Of Breath  Review of Systems  Constitutional:  Negative for chills and fever.  Respiratory:  Negative for cough and shortness of breath.   Cardiovascular:  Negative for chest pain and palpitations.  Gastrointestinal:  Negative for nausea and vomiting.  Skin:  Positive for itching and rash.  Neurological:  Negative for dizziness.  Psychiatric/Behavioral:  Positive for depression. Negative for substance abuse and suicidal ideas. The patient is nervous/anxious and has insomnia.        Objective:    Physical Exam Constitutional:      General: She is not in acute distress.    Appearance: She is not ill-appearing.  HENT:     Nose: Nose normal.     Mouth/Throat:     Mouth: Mucous membranes are moist.     Pharynx: Oropharynx is clear.   Eyes:     Extraocular Movements: Extraocular movements intact.     Conjunctiva/sclera: Conjunctivae normal.    Cardiovascular:     Rate and Rhythm: Normal rate.  Pulmonary:     Effort: Pulmonary effort is normal.   Musculoskeletal:     Cervical back: Normal range of motion and neck supple.     Right lower leg: No edema.     Left lower leg: No edema.   Skin:    General: Skin is warm and dry.     Findings: Rash present.     Comments: Right anterolateral upper arm with a red rash, no vesicles or drainage. No surrounding erythema or sign of infection.    Neurological:     General: No focal deficit present.     Mental Status: She is alert and oriented to person, place, and time.     Cranial Nerves: No cranial nerve deficit.     Motor: No weakness.     Coordination:  Coordination normal.     Gait: Gait normal.   Psychiatric:        Mood and Affect: Mood normal.        Behavior: Behavior normal.        Thought Content: Thought content normal.      BP 126/74 (BP Location: Left Arm, Patient Position: Sitting)   Pulse 71   Temp 98.4 F (36.9 C) (Temporal)   Ht 5' 5 (1.651 m)   Wt 164 lb 12.8 oz (74.8 kg)   LMP 02/02/2013   SpO2 96%   BMI 27.42 kg/m  Wt Readings from Last 3 Encounters:  07/10/23 164 lb 12.8 oz (74.8 kg)  07/06/23 163 lb (73.9 kg)  04/29/23 163 lb (73.9 kg)       Assessment & Plan:   Problem List Items Addressed This Visit   None Visit Diagnoses       Rash and nonspecific skin eruption    -  Primary     Sleep concern          Concerning for shingles.  Valacyclovir prescribed.  Recommend Benadryl at bedtime.  She is dealing with anxiety and depression as well as grief.  Not sleeping as well recently.  She is seen behavioral health and reports good compliance with Wellbutrin , Celexa  and occasionally takes alprazolam .  Encouraged her to reach out to [facility], NP and follow-up with PCP, Dr. Rochelle Chu.  I am having [name] start on valACYclovir and triamcinolone  cream. I am also having her maintain her Vitamin B12, VITAMIN D  PO, loratadine, Dexcom G7 Receiver, co-enzyme Q-10, OVER THE COUNTER MEDICATION, OVER THE COUNTER MEDICATION, Dexcom G7 Sensor, pantoprazole , atorvastatin , AMBULATORY NON FORMULARY  MEDICATION, dicyclomine , ALPRAZolam , buPROPion , citalopram , ramipril , and dapagliflozin  propanediol.  Meds ordered this encounter  Medications   valACYclovir (VALTREX) 1000 MG tablet    Sig: Take 1 tablet (1,000 mg total) by mouth 3 (three) times daily for 7 days.    Dispense:  21 tablet    Refill:  0    Supervising Provider:   Bambi Lever A [[id number]]   triamcinolone  cream (KENALOG) 0.1 %    Sig: Apply 1 Application topically 2 (two) times daily.    Dispense:  30 g    Refill:  0    Supervising Provider:    Bambi Lever A [[id number]]

## 2023-07-10 NOTE — Patient Instructions (Signed)
 Take the antiviral medication, valacyclovir, as prescribed.  Use the steroid cream for itching.  Take Benadryl at bedtime as needed for itching.  Continue Claritin in the mornings  Cool compresses to the area will also help with itching.  Please follow-up with Dr. Rochelle Chu

## 2023-07-13 ENCOUNTER — Other Ambulatory Visit (HOSPITAL_COMMUNITY): Payer: Self-pay

## 2023-07-13 DIAGNOSIS — M1812 Unilateral primary osteoarthritis of first carpometacarpal joint, left hand: Secondary | ICD-10-CM | POA: Diagnosis not present

## 2023-07-13 MED ORDER — MELOXICAM 15 MG PO TABS
15.0000 mg | ORAL_TABLET | Freq: Every day | ORAL | 1 refills | Status: AC
  Filled 2023-07-13: qty 30, 30d supply, fill #0
  Filled 2023-08-30: qty 30, 30d supply, fill #1

## 2023-07-17 ENCOUNTER — Encounter: Payer: Self-pay | Admitting: "Endocrinology

## 2023-07-27 ENCOUNTER — Ambulatory Visit: Payer: BC Managed Care – PPO | Admitting: Neurology

## 2023-07-27 ENCOUNTER — Encounter: Payer: Self-pay | Admitting: Neurology

## 2023-07-27 VITALS — BP 112/75 | HR 73 | Ht 66.0 in | Wt 164.0 lb

## 2023-07-27 DIAGNOSIS — R202 Paresthesia of skin: Secondary | ICD-10-CM | POA: Diagnosis not present

## 2023-07-27 NOTE — Progress Notes (Signed)
 Follow-up Visit   Date: 07/27/2023    [name] MRN: [id number] DOB: October 08, 1960    [name] is a 63 y.o. right-handed Caucasian female with diabetes mellitus, hyperlipidemia, depression, and hypertension returning to the clinic for follow-up of left foot weakness.  The patient was accompanied to the clinic by self.  IMPRESSION/PLAN: Intermittent paresthesias of the legs.  No evidence of neuropathy on exam and symptoms overall are stable.  NCS/EMG of the legs discussed and opted to hold on testing unless symptoms get worse.  Intermittent paresthesias of the chest and arms, most likely stress reaction.  Left foot drop, resolved.  Symptoms most likely due to transient compression of peroneal nerve.   Return to clinic as needed  --------------------------------------------- UPDATE 12/27/2021:  About a month ago, she had one spell of having weakness in her left foot where she was unable to raise her left foot at the ankle or extend her toes. Symptoms lasted 3 hours.  Prior to this, she was sitting eating lunch for about 15 minutes and noticed symptoms when she returned to work and was walking in the parking lot.  She recalls having to raise the left leg to clear the ground.  She had associated numbness of the foot.  There was no weakness/numbness involving the face or arm.  No associated back pain or shooting.   This has not recurred.  She does cross her legs, but does not remember if she was sitting in this position.  She also complains of burning and tingling in the forearm.    UPDATE 06/30/2022:  She is here for 41-month follow-up.  She has been having a rough few months after her husband passed away suddenly in 04/27/23.  She reports that he has a lot of things and hoarded for more than 10 years with two home, vehicles, and other items that she is trying to get valued to sell.  She is very tearful at today's visit and expresses being overwhelmed.    She reports having new  tingling over the arms and legs.  No weakness or imbalance.   UPDATE 01/27/2023:  She is here for 6 month follow-up.  She reports having 2-3 days of the week where she has some tingling in the legs, which can last up to half a day.  Symptoms are intermittent when it occurs.  She also reports having occasional electrical-like sensation across her chest, radiating into the arms, and abdomen, when she wakes up.  She feels very stressed when this occurs and feels that it is more of stress reaction.  No associated weakness or neck pain.  She reports that she is in a better state of mind now.  She has managed the more urgent affairs after her husband's passing and now taking things a little slower.   UPDATE 7/72025:  She is here for follow-up visit. She continues to have intermittent paresthesias of the feet and lower legs.  Symptoms occur a few times during the week.  No associated weakness or imbalance.  She also has spells of numbness/tingling in the chest and arm, which is always worse when she is stressed.  She is seeing a Veterinary surgeon.   Medications:  Current Outpatient Medications on File Prior to Visit  Medication Sig Dispense Refill   ALPRAZolam  (XANAX ) 0.5 MG tablet Take 1 tablet (0.5 mg total) by mouth at bedtime as needed for anxiety. 30 tablet 2   atorvastatin  (LIPITOR) 10 MG tablet Take 1 tablet (10 mg total)  by mouth daily. 90 tablet 1   buPROPion  (WELLBUTRIN  XL) 300 MG 24 hr tablet Take 1 tablet (300 mg total) by mouth daily. 90 tablet 1   citalopram  (CELEXA ) 40 MG tablet Take 1 tablet (40 mg total) by mouth daily. 90 tablet 1   Continuous Glucose Receiver (DEXCOM G7 RECEIVER) DEVI Use as directed. 9 each 1   Continuous Glucose Sensor (DEXCOM G7 SENSOR) MISC Attach to skin as directed. Replace every 10 days. 3 each 3   Cyanocobalamin  (VITAMIN B12) 1000 MCG TBCR Take by mouth.     dapagliflozin  propanediol (FARXIGA ) 10 MG TABS tablet Take 1 tablet (10 mg total) by mouth daily before breakfast. 90  tablet 0   dicyclomine  (BENTYL ) 10 MG capsule Take 1 capsule (10 mg total) by mouth 3 (three) times daily as needed. 90 capsule 0   loratadine (CLARITIN) 10 MG tablet      meloxicam  (MOBIC ) 15 MG tablet Take 1 tablet (15 mg total) by mouth daily. (Patient taking differently: Take 15 mg by mouth as needed.) 30 tablet 1   pantoprazole  (PROTONIX ) 20 MG tablet Take 1 tablet (20 mg) by mouth daily. 90 tablet 1   triamcinolone  cream (KENALOG ) 0.1 % Apply 1 Application topically 2 (two) times daily. 30 g 0   VITAMIN D  PO Take 5,000 Units by mouth.     AMBULATORY NON FORMULARY MEDICATION Medication Name: Diltiazem 2% gel with 5% lidocaine   Using your index finger, apply a pea size amount of medication inside the rectum up to your first knuckle/joint 3 times daily x 8 weeks. (Patient not taking: Reported on 07/27/2023) 30 mg 0   co-enzyme Q-10 30 MG capsule Take 30 mg by mouth daily. (Patient not taking: Reported on 07/27/2023)     OVER THE COUNTER MEDICATION GI Microb-X 1 tablet daily (Patient not taking: Reported on 07/27/2023)     OVER THE COUNTER MEDICATION Fodmate 1-2 capsules with each meal (Patient not taking: Reported on 07/27/2023)     ramipril  (ALTACE ) 2.5 MG capsule Take 1 capsule (2.5 mg total) by mouth daily. (Patient not taking: Reported on 07/27/2023) 90 capsule 0   No current facility-administered medications on file prior to visit.    Allergies:  Allergies  Allergen Reactions   Flagyl [Metronidazole] Diarrhea   Metformin And Related Shortness Of Breath    Vital Signs:  BP 112/75   Pulse 73   Ht 5' 6 (1.676 m)   Wt 164 lb (74.4 kg)   LMP 02/02/2013   SpO2 97%   BMI 26.47 kg/m   Neurological Exam: MENTAL STATUS including orientation to time, place, person, recent and remote memory, attention span and concentration, language, and fund of knowledge is normal.  Speech is not dysarthric.  CRANIAL NERVES:  Pupils equal round and reactive to light, mild anisocoria with left pupil slightly  larger.  Normal conjugate, extra-ocular eye movements in all directions of gaze.  No ptosis.  Face is symmetric.   MOTOR:  Motor strength is 5/5 in all extremities, including left foot dorsiflexion and toe extension.  No atrophy, fasciculations or abnormal movements.  MSRs:  Reflexes are 2+/4 throughout, including distally.  SENSORY:  Vibration intact throughout.  COORDINATION/GAIT:  Normal finger-to- nose-finger.  Intact rapid alternating movements bilaterally.  Gait narrow based and stable.    Data:  Lab Results  Component Value Date   HGBA1C 6.4 07/06/2023     Thank you for allowing me to participate in patient's care.  If I can answer any  additional questions, I would be pleased to do so.    Sincerely,    Lovel Suazo K. Tobie, DO

## 2023-07-28 DIAGNOSIS — F4323 Adjustment disorder with mixed anxiety and depressed mood: Secondary | ICD-10-CM | POA: Diagnosis not present

## 2023-08-03 ENCOUNTER — Encounter: Payer: Self-pay | Admitting: Internal Medicine

## 2023-08-03 ENCOUNTER — Ambulatory Visit (INDEPENDENT_AMBULATORY_CARE_PROVIDER_SITE_OTHER): Admitting: Internal Medicine

## 2023-08-03 VITALS — BP 122/68 | HR 60 | Ht 66.0 in | Wt 164.0 lb

## 2023-08-03 DIAGNOSIS — R1013 Epigastric pain: Secondary | ICD-10-CM

## 2023-08-03 DIAGNOSIS — Z8719 Personal history of other diseases of the digestive system: Secondary | ICD-10-CM

## 2023-08-03 DIAGNOSIS — R110 Nausea: Secondary | ICD-10-CM | POA: Diagnosis not present

## 2023-08-03 DIAGNOSIS — Z860100 Personal history of colon polyps, unspecified: Secondary | ICD-10-CM

## 2023-08-03 DIAGNOSIS — K219 Gastro-esophageal reflux disease without esophagitis: Secondary | ICD-10-CM

## 2023-08-03 NOTE — Patient Instructions (Signed)
 Glad you are feeling better!  Follow up as needed   _______________________________________________________  If your blood pressure at your visit was 140/90 or greater, please contact your primary care physician to follow up on this.  _______________________________________________________  If you are age 64 or older, your body mass index should be between 23-30. Your Body mass index is 26.47 kg/m. If this is out of the aforementioned range listed, please consider follow up with your Primary Care Provider.  If you are age 23 or younger, your body mass index should be between 19-25. Your Body mass index is 26.47 kg/m. If this is out of the aformentioned range listed, please consider follow up with your Primary Care Provider.   ________________________________________________________  The [facility] providers would like to encourage you to use MYCHART to communicate with providers for non-urgent requests or questions.  Due to long hold times on the telephone, sending your provider a message by [facility] may be a faster and more efficient way to get a response.  Please allow 48 business hours for a response.  Please remember that this is for non-urgent requests.  _______________________________________________________  Thank you for entrusting me with your care and for choosing [facility], Dr. Estefana Kidney

## 2023-08-03 NOTE — Progress Notes (Signed)
 08/03/2023 [name] [id number] [birth year]   HISTORY OF PRESENT ILLNESS: This is a 63 year old female presents for follow up of abdominal pain, GERD, and rectal bleeding  Interval History: Since she was last here, she has not had any ab pain. She never had to use the diltiazem/lidocaine  ointment because she was delayed in picking up the ointment. Then her rectal pain/bleeding had already resolved so she never had to use the ointment. Denies rectal bleeding or rectal pain currently. She is taking her Protonix , which is working well. Reflux is well controlled. She is having one stool every day. Denies N&V. She is not using any Gas-X or Bentyl . She tried the [facility] samples, which seemed to help when she used it. She has been eating more food as a result of the improvement in her GI symptoms. She has gained about 5 lbs over the last 6 weeks since she has been feeling well.   Wt Readings from Last 3 Encounters:  08/03/23 164 lb (74.4 kg)  07/27/23 164 lb (74.4 kg)  07/10/23 164 lb 12.8 oz (74.8 kg)   Outpatient Encounter Medications as of 08/03/2023  Medication Sig   ALPRAZolam  (XANAX ) 0.5 MG tablet Take 1 tablet (0.5 mg total) by mouth at bedtime as needed for anxiety.   AMBULATORY NON FORMULARY MEDICATION Medication Name: Diltiazem 2% gel with 5% lidocaine   Using your index finger, apply a pea size amount of medication inside the rectum up to your first knuckle/joint 3 times daily x 8 weeks. (Patient not taking: Reported on 07/27/2023)   atorvastatin  (LIPITOR) 10 MG tablet Take 1 tablet (10 mg total) by mouth daily.   buPROPion  (WELLBUTRIN  XL) 300 MG 24 hr tablet Take 1 tablet (300 mg total) by mouth daily.   citalopram  (CELEXA ) 40 MG tablet Take 1 tablet (40 mg total) by mouth daily.   co-enzyme Q-10 30 MG capsule Take 30 mg by mouth daily. (Patient not taking: Reported on 07/27/2023)   Continuous Glucose Receiver (DEXCOM G7 RECEIVER) DEVI Use as directed.   Continuous Glucose Sensor  (DEXCOM G7 SENSOR) MISC Attach to skin as directed. Replace every 10 days.   Cyanocobalamin  (VITAMIN B12) 1000 MCG TBCR Take by mouth.   dapagliflozin  propanediol (FARXIGA ) 10 MG TABS tablet Take 1 tablet (10 mg total) by mouth daily before breakfast.   dicyclomine  (BENTYL ) 10 MG capsule Take 1 capsule (10 mg total) by mouth 3 (three) times daily as needed.   loratadine (CLARITIN) 10 MG tablet    meloxicam  (MOBIC ) 15 MG tablet Take 1 tablet (15 mg total) by mouth daily. (Patient taking differently: Take 15 mg by mouth as needed.)   OVER THE COUNTER MEDICATION GI Microb-X 1 tablet daily (Patient not taking: Reported on 07/27/2023)   OVER THE COUNTER MEDICATION Fodmate 1-2 capsules with each meal (Patient not taking: Reported on 07/27/2023)   pantoprazole  (PROTONIX ) 20 MG tablet Take 1 tablet (20 mg) by mouth daily.   ramipril  (ALTACE ) 2.5 MG capsule Take 1 capsule (2.5 mg total) by mouth daily. (Patient not taking: Reported on 08/03/2023)   triamcinolone  cream (KENALOG ) 0.1 % Apply 1 Application topically 2 (two) times daily.   VITAMIN D  PO Take 5,000 Units by mouth.   No facility-administered encounter medications on file as of 08/03/2023.   PHYSICAL EXAM: BP 122/68   Pulse 60   Ht 5' 6 (1.676 m)   Wt 164 lb (74.4 kg)   LMP 02/02/2013   BMI 26.47 kg/m  General: Well developed white  female in no acute distress Head: Normocephalic and atraumatic Eyes:  Sclerae anicteric,conjunctive pink. Lungs: Clear throughout to auscultation; no W/R/R. Heart: Regular rate and rhythm; no M/R/G. Abdomen: Soft, non-distended.  BS present.  Non-tender. Musculoskeletal: Symmetrical with no gross deformities  Skin: No lesions on visible extremities Extremities: No edema  Neurological: Alert oriented x 4, grossly non-focal Psychological:  Alert and cooperative. Normal mood and affect  Labs 09/2021: CBC with mildly elevated platelets of 403. LFTs nml. Lipase nml. Amylase nml. ESR nml. CRP nml. Vit B12 and folate  nml. Copper  level nml.   Labs 01/2022: TTG IgA neg. TSH nml. Vit D nml. Iron/TIBC nml.   Labs 06/2022: CMP with mildly elevated glucose of 136. TSH nml. Vit D nml. Vit B12 nml. HbA1C 6.4%  Labs 03/2023: CBC with mildly elevated relative eosinophils. BMP nml. LFTs nml. TSH nml.   Labs 06/2023: CMP unremarkable. TSH nml. Free T4 nml. AM cortisol elevated at 20.5. HbA1C 6.4%.  Gastric emptying study 12/16/21: IMPRESSION: Normal gastric emptying study.  EGD 11/11/21: - Normal esophagus. - Four gastric polyps. Resected and retrieved. - Erythematous mucosa in the antrum. Biopsied. - Two duodenal polyps. Resected and retrieved. Path: 1. Surgical [P], duodenal polyp bx's BENIGN GASTRIC HETEROTOPIA 2. Surgical [P], gastric REACTIVE GASTROPATHY WITH MINIMAL CHRONIC GASTRITIS NEGATIVE FOR H. PYLORI, INTESTINAL METAPLASIA, DYSPLASIA AND CARCINOMA 3. Surgical [P], gastric polyps, polyp (4) BENIGN FUNDIC GLAND POLYP MINIMAL CHRONIC GASTRITIS WITH REACTIVE EPITHELIAL CHANGES NEGATIVE FOR H. PYLORI, INTESTINAL METAPLASIA, DYSPLASIA AND CARCINOMA  Colonoscopy 11/11/21: - The examined portion of the ileum was normal. - Two 3 to 4 mm polyps in the transverse colon and in the ascending colon, removed with a cold snare. Resected and retrieved. - Non-bleeding internal hemorrhoids. Path: 4. Surgical [P], colon, ascending, transverse, polyp (2) TUBULAR ADENOMA NEGATIVE FOR HIGH-GRADE DYSPLASIA AND CARCINOMA  ASSESSMENT AND PLAN: Epigastric ab pain Nausea GERD History of gastritis History of SIBO History of colon polyps History of anal fissure Patient overall has had improvement in several of her symptoms.  Her anal fissure resolved without any topical therapy.  Patient's abdominal pain has overall improved so she is no longer required Bentyl , Gas-X, or FDgard.  Her GERD is doing well on pantoprazole  therapy. - Previously educated on low FODMAP diet - Previously has responded well to antibiotics  but symptoms are not excessively bothersome at this time - FDGard seemed effective so can use this in future if needed.  - Cont pantoprazole  20 mg every day. - Patient has not recently required Gas-X PRN or Bentyl  PRN - Next colonoscopy is due in 10/2028 for polyp surveillance - RTC PRN   I spent 31 minutes of time, including in depth chart review, independent review of results as outlined above, communicating results with the patient directly, face-to-face time with the patient, coordinating care, ordering studies and medications as appropriate, and documentation.

## 2023-08-04 DIAGNOSIS — Z713 Dietary counseling and surveillance: Secondary | ICD-10-CM | POA: Diagnosis not present

## 2023-08-07 DIAGNOSIS — Z713 Dietary counseling and surveillance: Secondary | ICD-10-CM | POA: Diagnosis not present

## 2023-08-10 DIAGNOSIS — H2513 Age-related nuclear cataract, bilateral: Secondary | ICD-10-CM | POA: Diagnosis not present

## 2023-08-10 DIAGNOSIS — H40013 Open angle with borderline findings, low risk, bilateral: Secondary | ICD-10-CM | POA: Diagnosis not present

## 2023-08-10 DIAGNOSIS — E119 Type 2 diabetes mellitus without complications: Secondary | ICD-10-CM | POA: Diagnosis not present

## 2023-08-10 DIAGNOSIS — H04123 Dry eye syndrome of bilateral lacrimal glands: Secondary | ICD-10-CM | POA: Diagnosis not present

## 2023-08-14 ENCOUNTER — Encounter (HOSPITAL_COMMUNITY): Payer: Self-pay

## 2023-08-14 ENCOUNTER — Ambulatory Visit: Payer: Self-pay

## 2023-08-14 ENCOUNTER — Ambulatory Visit (HOSPITAL_COMMUNITY)
Admission: EM | Admit: 2023-08-14 | Discharge: 2023-08-14 | Disposition: A | Discharge status: Home / Self Care | Attending: Emergency Medicine | Admitting: Emergency Medicine

## 2023-08-14 DIAGNOSIS — N3001 Acute cystitis with hematuria: Secondary | ICD-10-CM | POA: Diagnosis not present

## 2023-08-14 LAB — POCT URINALYSIS DIP (MANUAL ENTRY)
Bilirubin, UA: NEGATIVE
Glucose, UA: >=1000 mg/dL — AB
Ketones, POC UA: NEGATIVE mg/dL
Nitrite, UA: NEGATIVE
Protein Ur, POC: NEGATIVE mg/dL
Spec Grav, UA: 1.01 (ref 1.010–1.025)
Urobilinogen, UA: 0.2 U/dL
pH, UA: 6 (ref 5.0–8.0)

## 2023-08-14 MED ORDER — NITROFURANTOIN MONOHYD MACRO 100 MG PO CAPS: 100.0000 mg | ORAL_CAPSULE | Freq: Two times a day (BID) | ORAL | 0 refills | Status: AC

## 2023-08-14 NOTE — Discharge Instructions (Signed)
 We will call you if anything on urine culture requires a change in therapy (about 1-3 days)  In the meantime I am treating you for a urinary tract infection. Please take the antibiotic Macrobid as prescribed, with food to avoid upset stomach. Drink lots of fluids!

## 2023-08-14 NOTE — ED Provider Notes (Signed)
 [facility]    CSN: [id number] Arrival date & time: 08/14/23  1725     History   Chief Complaint Chief Complaint  Patient presents with   Urinary Tract Infection    HPI [name] is a 63 y.o. female.   Developed dysuria and urinary urgency today Denies abdominal pain, flank pain, nausea/vomiting, fever No vaginal symptoms  She tried 1 urinary pain relief tablet  Past Medical History:  Diagnosis Date   Depression    Diabetes mellitus without complication (HCC)    GERD (gastroesophageal reflux disease)    Hypertension    Sleep apnea     Patient Active Problem List   Diagnosis Date Noted   Bradycardia 04/04/2023   Need for immunization against influenza 04/02/2023   Encounter for immunization 08/13/2022   Type II diabetes mellitus with manifestations (HCC) 08/06/2022   Abnormal electrocardiogram (ECG) (EKG) 08/06/2022   Long term (current) use of oral hypoglycemic drugs 06/26/2022   Screening for colon cancer 09/26/2021   Age-related osteoporosis without current pathological fracture 08/21/2021   Atrophy of vagina 08/21/2021   Diabetic polyneuropathy associated with type 2 diabetes mellitus (HCC) 06/11/2020   Diabetes mellitus (HCC) 06/11/2020   Primary hypertension 06/11/2020   Hyperlipidemia with target LDL less than 100 06/11/2020   Papanicolaou smear of cervix with atypical squamous cells cannot exclude high grade squamous intraepithelial lesion (ASC-H) 05/09/2020   Gastroesophageal reflux disease 05/09/2020   Major depression, single episode, in complete remission (HCC) 05/09/2020   Mixed hyperlipidemia 05/09/2020   Obesity 05/09/2020   Obstructive sleep apnea syndrome 05/09/2020   Vitamin D  deficiency 05/09/2020   Allergic rhinitis 05/09/2020   Carpal tunnel syndrome 10/22/2017    Past Surgical History:  Procedure Laterality Date   COLONOSCOPY  2013   hypo plastic polyps at [facility] in [place]   COLONOSCOPY WITH  ESOPHAGOGASTRODUODENOSCOPY (EGD)  11/11/2021   DIAGNOSTIC LAPAROSCOPY     DILATION AND CURETTAGE OF UTERUS N/A 05/09/2020   Procedure: CERVICAL DILATATION AND ENDOCERVICAL CURETTAGE;  Surgeon: Storm Setter, DO;  Location: [facility];  Service: Gynecology;  Laterality: N/A;   LABIOPLASTY Left 08/10/2017   Procedure: LABIAPLASTY REVISION;  Surgeon: Timmie Norris, MD;  Location: [facility];  Service: Gynecology;  Laterality: Left;  Labiaplasty revision   UPPER GASTROINTESTINAL ENDOSCOPY     WRIST SURGERY  11/2022    OB History   No obstetric history on file.      Home Medications    Prior to Admission medications   Medication Sig Start Date End Date Taking? Authorizing Provider  nitrofurantoin, macrocrystal-monohydrate, (MACROBID) 100 MG capsule Take 1 capsule (100 mg total) by mouth 2 (two) times daily for 7 days. 08/14/23 08/21/23 Yes Karry Causer, Asberry, PA-C  ALPRAZolam  (XANAX ) 0.5 MG tablet Take 1 tablet (0.5 mg total) by mouth at bedtime as needed for anxiety. 06/02/23   Mozingo, Regina Nattalie, NP  AMBULATORY NON FORMULARY MEDICATION Medication Name: Diltiazem 2% gel with 5% lidocaine   Using your index finger, apply a pea size amount of medication inside the rectum up to your first knuckle/joint 3 times daily x 8 weeks. Patient not taking: Reported on 07/27/2023 04/29/23   Federico Rosario BROCKS, MD  atorvastatin  (LIPITOR) 10 MG tablet Take 1 tablet (10 mg total) by mouth daily. 04/07/23   Joshua Debby LITTIE, MD  buPROPion  (WELLBUTRIN  XL) 300 MG 24 hr tablet Take 1 tablet (300 mg total) by mouth daily. 06/02/23   Mozingo, Regina Nattalie, NP  citalopram  (CELEXA )  40 MG tablet Take 1 tablet (40 mg total) by mouth daily. 06/02/23   Mozingo, Regina Nattalie, NP  co-enzyme Q-10 30 MG capsule Take 30 mg by mouth daily. Patient not taking: Reported on 07/27/2023    [provider]  Continuous Glucose Receiver (DEXCOM G7 RECEIVER) DEVI Use as directed. 09/21/21   Joshua Debby CROME, MD  Continuous Glucose Sensor (DEXCOM G7 SENSOR) MISC Attach to skin as directed. Replace every 10 days. 09/25/22   Joshua Debby CROME, MD  Cyanocobalamin  (VITAMIN B12) 1000 MCG TBCR Take by mouth.    [provider]  dapagliflozin  propanediol (FARXIGA ) 10 MG TABS tablet Take 1 tablet (10 mg total) by mouth daily before breakfast. 06/04/23   Nida, Gebreselassie W, MD  dicyclomine  (BENTYL ) 10 MG capsule Take 1 capsule (10 mg total) by mouth 3 (three) times daily as needed. 04/29/23   Federico Rosario BROCKS, MD  loratadine (CLARITIN) 10 MG tablet  10/02/21   [provider]  meloxicam  (MOBIC ) 15 MG tablet Take 1 tablet (15 mg total) by mouth daily. Patient taking differently: Take 15 mg by mouth as needed. 07/13/23   Alyse Agent, MD  OVER THE COUNTER MEDICATION GI Microb-X 1 tablet daily Patient not taking: Reported on 07/27/2023    [provider]  OVER THE COUNTER MEDICATION Fodmate 1-2 capsules with each meal Patient not taking: Reported on 07/27/2023    [provider]  pantoprazole  (PROTONIX ) 20 MG tablet Take 1 tablet (20 mg) by mouth daily. 12/23/22   Federico Rosario BROCKS, MD  ramipril  (ALTACE ) 2.5 MG capsule Take 1 capsule (2.5 mg total) by mouth daily. Patient not taking: Reported on 08/03/2023 06/04/23   Nida, Gebreselassie W, MD  triamcinolone  cream (KENALOG ) 0.1 % Apply 1 Application topically 2 (two) times daily. 07/10/23   Henson, Vickie L, NP-C  VITAMIN D  PO Take 5,000 Units by mouth.    [provider]    Family History Family History  Problem Relation Age of Onset   Dementia Mother    Alzheimer's disease Mother    Hypertension Mother    Rheumatic fever Father    Bipolar disorder Sister    Diabetes Sister    High Cholesterol Sister    Bipolar disorder Brother    Depression Maternal Grandmother    Diabetes Maternal Grandfather    Pancreatic cancer Paternal Grandfather    Colon cancer Neg Hx    Colon polyps Neg Hx    Esophageal cancer Neg Hx    Rectal  cancer Neg Hx    Stomach cancer Neg Hx     Social History Social History   Tobacco Use   Smoking status: Former    Types: Cigarettes   Smokeless tobacco: Never   Tobacco comments:    Not every day smoker in the past  Vaping Use   Vaping status: Never Used  Substance Use Topics   Alcohol use: Yes    Comment: rarely about 2 drinks per month   Drug use: Never     Allergies   Flagyl [metronidazole] and Metformin and related   Review of Systems Review of Systems  Per HPI  Physical Exam Triage Vital Signs ED Triage Vitals [08/14/23 1830]  Encounter Vitals Group     BP 125/74     Girls Systolic BP Percentile      Girls Diastolic BP Percentile      Boys Systolic BP Percentile      Boys Diastolic BP Percentile  Pulse Rate 69     Resp 18     Temp 98.2 F (36.8 C)     Temp Source Oral     SpO2 96 %     Weight      Height      Head Circumference      Peak Flow      Pain Score 0     Pain Loc      Pain Education      Exclude from Growth Chart    No data found.  Updated Vital Signs BP 125/74 (BP Location: Left Arm)   Pulse 69   Temp 98.2 F (36.8 C) (Oral)   Resp 18   LMP 02/02/2013   SpO2 96%   Physical Exam Vitals and nursing note reviewed.  Constitutional:      General: She is not in acute distress. HENT:     Mouth/Throat:     Mouth: Mucous membranes are moist.     Pharynx: Oropharynx is clear.  Eyes:     Conjunctiva/sclera: Conjunctivae normal.     Pupils: Pupils are equal, round, and reactive to light.  Cardiovascular:     Rate and Rhythm: Normal rate and regular rhythm.     Heart sounds: Normal heart sounds.  Pulmonary:     Effort: Pulmonary effort is normal.     Breath sounds: Normal breath sounds.  Abdominal:     General: Bowel sounds are normal.     Palpations: Abdomen is soft.     Tenderness: There is no abdominal tenderness. There is no right CVA tenderness or left CVA tenderness.  Neurological:     Mental Status: She is alert  and oriented to person, place, and time.     UC Treatments / Results  Labs (all labs ordered are listed, but only abnormal results are displayed) Labs Reviewed  POCT URINALYSIS DIP (MANUAL ENTRY) - Abnormal; Notable for the following components:      Result Value   Glucose, UA >=1,000 (*)    Blood, UA moderate (*)    Leukocytes, UA Small (1+) (*)    All other components within normal limits  URINE CULTURE    EKG  Radiology No results found.  Procedures Procedures   Medications Ordered in UC Medications - No data to display  Initial Impression / Assessment and Plan / UC Course  I have reviewed the triage vital signs and the nursing notes.  Pertinent labs & imaging results that were available during my care of the patient were reviewed by me and considered in my medical decision making (see chart for details).  UA moderate blood, small leuks. Pending culture Also has >1000 glucose, diabetes history. On chart review, great kidney function.  Treat with Macrobid twice daily for 7 days.  Discussed will call if culture results require change in therapy.  All questions answered, agrees to plan  Final Clinical Impressions(s) / UC Diagnoses   Final diagnoses:  Acute cystitis with hematuria     Discharge Instructions      We will call you if anything on urine culture requires a change in therapy (about 1-3 days)  In the meantime I am treating you for a urinary tract infection. Please take the antibiotic Macrobid as prescribed, with food to avoid upset stomach. Drink lots of fluids!    ED Prescriptions     Medication Sig Dispense Auth. Provider   nitrofurantoin, macrocrystal-monohydrate, (MACROBID) 100 MG capsule Take 1 capsule (100 mg total) by mouth 2 (  two) times daily for 7 days. 14 capsule Bambie Pizzolato, Asberry, PA-C      PDMP not reviewed this encounter.   Jeryl Asberry RIGGERS 08/14/23 1946

## 2023-08-14 NOTE — Telephone Encounter (Signed)
 Patient states that she is going to urgent care.

## 2023-08-14 NOTE — ED Triage Notes (Signed)
 Pt c/o burning on urination with urgency today. States took one dose of OTC UTI sx relief with no relief.

## 2023-08-14 NOTE — Telephone Encounter (Signed)
 FYI Only or Action Required?: FYI only for provider.  Patient was last seen in primary care on 07/10/2023 by Lendia Boby CROME, NP-C.  Called Nurse Triage reporting No chief complaint on file..  Symptoms began today.  Interventions attempted: Nothing.  Symptoms are: gradually worsening.  Triage Disposition: No disposition on file.  Patient/caregiver understands and will follow disposition?:   Reason for Disposition  Urinating more frequently than usual (i.e., frequency) OR new-onset of the feeling of an urgent need to urinate (i.e., urgency)  Bad or foul-smelling urine  Answer Assessment - Initial Assessment Questions No in office appts available, pt agreed to go to [facility] at [place]. Provided with address and telephone number.  1. SYMPTOM: What's the main symptom you're concerned about? (e.g., frequency, incontinence)     Increased frequency, burning, malodour  2. ONSET:      Today  3. PAIN: Is there any pain? If Yes, ask: How bad is it? (Scale: 1-10; mild, moderate, severe)     Denies  4. CAUSE: What do you think is causing the symptoms?     Recent sexual intercourse  5. OTHER SYMPTOMS: Do you have any other symptoms? (e.g., blood in urine, fever, flank pain, pain with urination)     Denies  Protocols used: Urinary Symptoms-A-AH

## 2023-08-14 NOTE — Telephone Encounter (Signed)
 first attempt: LVM for patient to return call to [phone]    Copied from CRM [id number]. Topic: Clinical - Medical Advice >> Aug 14, 2023  2:13 PM Jasmin G wrote: Reason for CRM: Pt is experiencing a UTI and would like to see if she could get some medication sent over to pharmacy to treat it, please call pt back ASAP

## 2023-08-16 LAB — URINE CULTURE: Culture: >=100000 — AB

## 2023-08-17 ENCOUNTER — Ambulatory Visit (HOSPITAL_COMMUNITY): Payer: Self-pay

## 2023-08-28 DIAGNOSIS — F4323 Adjustment disorder with mixed anxiety and depressed mood: Secondary | ICD-10-CM | POA: Diagnosis not present

## 2023-09-01 ENCOUNTER — Other Ambulatory Visit: Payer: Self-pay

## 2023-09-03 ENCOUNTER — Ambulatory Visit: Admitting: Family

## 2023-09-03 ENCOUNTER — Other Ambulatory Visit (HOSPITAL_COMMUNITY): Payer: Self-pay

## 2023-09-03 VITALS — BP 106/72 | HR 76 | Temp 98.7°F | Ht 66.0 in | Wt 161.2 lb

## 2023-09-03 DIAGNOSIS — R300 Dysuria: Secondary | ICD-10-CM | POA: Diagnosis not present

## 2023-09-03 DIAGNOSIS — N390 Urinary tract infection, site not specified: Secondary | ICD-10-CM | POA: Diagnosis not present

## 2023-09-03 DIAGNOSIS — A499 Bacterial infection, unspecified: Secondary | ICD-10-CM | POA: Diagnosis not present

## 2023-09-03 LAB — POC URINALSYSI DIPSTICK (AUTOMATED)
Bilirubin, UA: NEGATIVE
Blood, UA: POSITIVE
Glucose, UA: POSITIVE — AB
Ketones, UA: NEGATIVE
Nitrite, UA: NEGATIVE
Protein, UA: NEGATIVE
Spec Grav, UA: <=1.005 — AB (ref 1.010–1.025)
Urobilinogen, UA: 0.2 U/dL
pH, UA: 6 (ref 5.0–8.0)

## 2023-09-03 MED ORDER — NITROFURANTOIN MONOHYD MACRO 100 MG PO CAPS
100.0000 mg | ORAL_CAPSULE | Freq: Two times a day (BID) | ORAL | 0 refills | Status: DC
  Filled 2023-09-03: qty 14, 7d supply, fill #0

## 2023-09-03 NOTE — Progress Notes (Signed)
 Acute Office Visit  Subjective:     Patient ID: [name], female    DOB: Jan 06, 1961, 63 y.o.   MRN: [id number]  Chief Complaint  Patient presents with  . Urinary Tract Infection    Symptoms of uti, burning sensation,     HPI Patient is in today with complaints of urinary frequency, urgency, burning sensation, foul-smelling urine x 3 days and worsening.  Denies any fevers or chills  Review of Systems  Constitutional:  Negative for chills and fever.  Respiratory: Negative.    Cardiovascular: Negative.   Genitourinary:  Positive for dysuria, frequency and urgency.  Musculoskeletal: Negative.   Neurological: Negative.   Endo/Heme/Allergies: Negative.   Psychiatric/Behavioral: Negative.      Past Medical History:  Diagnosis Date  . Depression   . Diabetes mellitus without complication (HCC)   . GERD (gastroesophageal reflux disease)   . Hypertension   . Sleep apnea     Social History   Socioeconomic History  . Marital status: Widowed    Spouse name: Tonette Shed  . Number of children: 0  . Years of education: Not on file  . Highest education level: Bachelor's degree (e.g., BA, AB, BS)  Occupational History  . Occupation: accountent  Tobacco Use  . Smoking status: Former    Types: Cigarettes  . Smokeless tobacco: Never  . Tobacco comments:    Not every day smoker in the past  Vaping Use  . Vaping status: Never Used  Substance and Sexual Activity  . Alcohol use: Yes    Comment: rarely about 2 drinks per month  . Drug use: Never  . Sexual activity: Not Currently    Birth control/protection: Post-menopausal  Other Topics Concern  . Not on file  Social History Narrative   Right handed    Lives in a one story home       Husband passed away unexpectedly 04-16-22   Caffiene 3 mugs a day   Social Drivers of Health   Financial Resource Strain: Low Risk  (07/09/2023)   Overall Financial Resource Strain (CARDIA)   . Difficulty of Paying Living  Expenses: Not hard at all  Food Insecurity: No Food Insecurity (07/09/2023)   Hunger Vital Sign   . Worried About Programme researcher, broadcasting/film/video in the Last Year: Never true   . Ran Out of Food in the Last Year: Never true  Transportation Needs: No Transportation Needs (07/09/2023)   PRAPARE - Transportation   . Lack of Transportation (Medical): No   . Lack of Transportation (Non-Medical): No  Physical Activity: Sufficiently Active (07/09/2023)   Exercise Vital Sign   . Days of Exercise per Week: 5 days   . Minutes of Exercise per Session: 30 min  Stress: Stress Concern Present (07/09/2023)   [employer] of Occupational Health - Occupational Stress Questionnaire   . Feeling of Stress: Very much  Social Connections: Moderately Integrated (07/09/2023)   Social Connection and Isolation Panel   . Frequency of Communication with Friends and Family: More than three times a week   . Frequency of Social Gatherings with Friends and Family: More than three times a week   . Attends Religious Services: More than 4 times per year   . Active Member of Clubs or Organizations: Yes   . Attends Banker Meetings: More than 4 times per year   . Marital Status: Widowed  Intimate Partner Violence: Not on file    Past Surgical History:  Procedure Laterality Date  .  COLONOSCOPY  2013   hypo plastic polyps at [facility] in [place]  . COLONOSCOPY WITH ESOPHAGOGASTRODUODENOSCOPY (EGD)  11/11/2021  . DIAGNOSTIC LAPAROSCOPY    . DILATION AND CURETTAGE OF UTERUS N/A 05/09/2020   Procedure: CERVICAL DILATATION AND ENDOCERVICAL CURETTAGE;  Surgeon: Storm Setter, DO;  Location: [facility];  Service: Gynecology;  Laterality: N/A;  . LABIOPLASTY Left 08/10/2017   Procedure: LABIAPLASTY REVISION;  Surgeon: Timmie Norris, MD;  Location: [facility];  Service: Gynecology;  Laterality: Left;  Labiaplasty revision  . UPPER GASTROINTESTINAL ENDOSCOPY    . WRIST SURGERY  11/2022     Family History  Problem Relation Age of Onset  . Dementia Mother   . Alzheimer's disease Mother   . Hypertension Mother   . Rheumatic fever Father   . Bipolar disorder Sister   . Diabetes Sister   . High Cholesterol Sister   . Bipolar disorder Brother   . Depression Maternal Grandmother   . Diabetes Maternal Grandfather   . Pancreatic cancer Paternal Grandfather   . Colon cancer Neg Hx   . Colon polyps Neg Hx   . Esophageal cancer Neg Hx   . Rectal cancer Neg Hx   . Stomach cancer Neg Hx     Allergies  Allergen Reactions  . Flagyl [Metronidazole] Diarrhea  . Metformin And Related Shortness Of Breath    Current Outpatient Medications on File Prior to Visit  Medication Sig Dispense Refill  . ALPRAZolam  (XANAX ) 0.5 MG tablet Take 1 tablet (0.5 mg total) by mouth at bedtime as needed for anxiety. 30 tablet 2  . AMBULATORY NON FORMULARY MEDICATION Medication Name: Diltiazem 2% gel with 5% lidocaine   Using your index finger, apply a pea size amount of medication inside the rectum up to your first knuckle/joint 3 times daily x 8 weeks. 30 mg 0  . atorvastatin  (LIPITOR) 10 MG tablet Take 1 tablet (10 mg total) by mouth daily. 90 tablet 1  . buPROPion  (WELLBUTRIN  XL) 300 MG 24 hr tablet Take 1 tablet (300 mg total) by mouth daily. 90 tablet 1  . citalopram  (CELEXA ) 40 MG tablet Take 1 tablet (40 mg total) by mouth daily. 90 tablet 1  . co-enzyme Q-10 30 MG capsule Take 30 mg by mouth daily.    . Continuous Glucose Receiver (DEXCOM G7 RECEIVER) DEVI Use as directed. 9 each 1  . Continuous Glucose Sensor (DEXCOM G7 SENSOR) MISC Attach to skin as directed. Replace every 10 days. 3 each 3  . Cyanocobalamin  (VITAMIN B12) 1000 MCG TBCR Take by mouth.    . dapagliflozin  propanediol (FARXIGA ) 10 MG TABS tablet Take 1 tablet (10 mg total) by mouth daily before breakfast. 90 tablet 0  . dicyclomine  (BENTYL ) 10 MG capsule Take 1 capsule (10 mg total) by mouth 3 (three) times daily as needed.  90 capsule 0  . loratadine (CLARITIN) 10 MG tablet     . meloxicam  (MOBIC ) 15 MG tablet Take 1 tablet (15 mg total) by mouth daily. (Patient taking differently: Take 15 mg by mouth as needed.) 30 tablet 1  . OVER THE COUNTER MEDICATION GI Microb-X 1 tablet daily    . OVER THE COUNTER MEDICATION Fodmate 1-2 capsules with each meal    . pantoprazole  (PROTONIX ) 20 MG tablet Take 1 tablet (20 mg) by mouth daily. 90 tablet 1  . ramipril  (ALTACE ) 2.5 MG capsule Take 1 capsule (2.5 mg total) by mouth daily. 90 capsule 0  . triamcinolone  cream (KENALOG ) 0.1 % Apply  1 Application topically 2 (two) times daily. 30 g 0  . VITAMIN D  PO Take 5,000 Units by mouth.     No current facility-administered medications on file prior to visit.    BP 106/72   Pulse 76   Temp 98.7 F (37.1 C) (Temporal)   Ht 5' 6 (1.676 m)   Wt 161 lb 4 oz (73.1 kg)   LMP 02/02/2013   SpO2 98%   BMI 26.03 kg/m chart     Objective:    BP 106/72   Pulse 76   Temp 98.7 F (37.1 C) (Temporal)   Ht 5' 6 (1.676 m)   Wt 161 lb 4 oz (73.1 kg)   LMP 02/02/2013   SpO2 98%   BMI 26.03 kg/m    Physical Exam Vitals reviewed.  Constitutional:      Appearance: Normal appearance. She is normal weight.  Cardiovascular:     Rate and Rhythm: Normal rate and regular rhythm.     Pulses: Normal pulses.     Heart sounds: Normal heart sounds.  Pulmonary:     Effort: Pulmonary effort is normal.     Breath sounds: Normal breath sounds.  Abdominal:     General: Abdomen is flat. Bowel sounds are normal.     Palpations: Abdomen is soft.  Musculoskeletal:        General: Normal range of motion.     Cervical back: Normal range of motion and neck supple.  Skin:    General: Skin is warm and dry.  Neurological:     General: No focal deficit present.     Mental Status: She is alert and oriented to person, place, and time.  Psychiatric:        Mood and Affect: Mood normal.        Behavior: Behavior normal.    No results found  for any visits on 09/03/23.      Assessment & Plan:   Problem List Items Addressed This Visit   None Visit Diagnoses       Dysuria    -  Primary   Relevant Orders   POCT Urinalysis Dipstick (Automated)     Bacterial urinary tract infection       Relevant Medications   nitrofurantoin , macrocrystal-monohydrate, (MACROBID ) 100 MG capsule   Other Relevant Orders   POCT Urinalysis Dipstick (Automated)       Meds ordered this encounter  Medications  . nitrofurantoin , macrocrystal-monohydrate, (MACROBID ) 100 MG capsule    Sig: Take 1 capsule (100 mg total) by mouth 2 (two) times daily.    Dispense:  14 capsule    Refill:  0    No follow-ups on file.  Varshini Arrants B Ariea Rochin, FNP

## 2023-09-04 ENCOUNTER — Other Ambulatory Visit: Payer: Self-pay | Admitting: Family

## 2023-09-04 ENCOUNTER — Other Ambulatory Visit: Payer: Self-pay | Admitting: Internal Medicine

## 2023-09-04 DIAGNOSIS — Z713 Dietary counseling and surveillance: Secondary | ICD-10-CM | POA: Diagnosis not present

## 2023-09-04 DIAGNOSIS — A499 Bacterial infection, unspecified: Secondary | ICD-10-CM | POA: Diagnosis not present

## 2023-09-04 DIAGNOSIS — N390 Urinary tract infection, site not specified: Secondary | ICD-10-CM | POA: Diagnosis not present

## 2023-09-04 DIAGNOSIS — R300 Dysuria: Secondary | ICD-10-CM | POA: Diagnosis not present

## 2023-09-07 LAB — URINE CULTURE
MICRO NUMBER:: 16838192
SPECIMEN QUALITY:: ADEQUATE

## 2023-09-07 LAB — CLIENT EDUCATION TRACKING

## 2023-09-07 LAB — HOUSE ACCOUNT TRACKING

## 2023-09-09 ENCOUNTER — Other Ambulatory Visit (HOSPITAL_COMMUNITY): Payer: Self-pay

## 2023-09-09 NOTE — Progress Notes (Signed)
 63 y.o. No obstetric history on file. female here for annual exam. Widowed.  Patient's last menstrual period was 02/02/2013.    She reports ***. Urine sample provided: ***  Abnormal bleeding: *** Pelvic discharge or pain: *** Breast mass, nipple discharge or skin changes : ***  Sexually active: *** Birth control: *** Last PAP: 03/03/14 Last mammogram: 04/08/23 density A, Bi-Rads 1 Neg Last colonoscopy: 11/11/21 55yr recall  Exercising: *** Smoker: ***  Flowsheet Row Office Visit from 04/02/2023 in [facility] [place]  PHQ-2 Total Score 0    Flowsheet Row Office Visit from 04/02/2023 in [facility] [place]  PHQ-9 Total Score 0     GYN HISTORY: ***  OB History  No obstetric history on file.   Past Medical History:  Diagnosis Date   Depression    Diabetes mellitus without complication (HCC)    GERD (gastroesophageal reflux disease)    Hypertension    Sleep apnea    Past Surgical History:  Procedure Laterality Date   COLONOSCOPY  2013   hypo plastic polyps at [facility] in [place]   COLONOSCOPY WITH ESOPHAGOGASTRODUODENOSCOPY (EGD)  11/11/2021   DIAGNOSTIC LAPAROSCOPY     DILATION AND CURETTAGE OF UTERUS N/A 05/09/2020   Procedure: CERVICAL DILATATION AND ENDOCERVICAL CURETTAGE;  Surgeon: Storm Setter, DO;  Location: [facility];  Service: Gynecology;  Laterality: N/A;   LABIOPLASTY Left 08/10/2017   Procedure: LABIAPLASTY REVISION;  Surgeon: Timmie Norris, MD;  Location: [facility];  Service: Gynecology;  Laterality: Left;  Labiaplasty revision   UPPER GASTROINTESTINAL ENDOSCOPY     WRIST SURGERY  11/2022   Current Outpatient Medications on File Prior to Visit  Medication Sig Dispense Refill   ALPRAZolam  (XANAX ) 0.5 MG tablet Take 1 tablet (0.5 mg total) by mouth at bedtime as needed for anxiety. 30 tablet 2   AMBULATORY NON FORMULARY MEDICATION Medication Name: Diltiazem 2% gel  with 5% lidocaine   Using your index finger, apply a pea size amount of medication inside the rectum up to your first knuckle/joint 3 times daily x 8 weeks. 30 mg 0   atorvastatin  (LIPITOR) 10 MG tablet Take 1 tablet (10 mg total) by mouth daily. 90 tablet 1   buPROPion  (WELLBUTRIN  XL) 300 MG 24 hr tablet Take 1 tablet (300 mg total) by mouth daily. 90 tablet 1   citalopram  (CELEXA ) 40 MG tablet Take 1 tablet (40 mg total) by mouth daily. 90 tablet 1   co-enzyme Q-10 30 MG capsule Take 30 mg by mouth daily.     Continuous Glucose Receiver (DEXCOM G7 RECEIVER) DEVI Use as directed. 9 each 1   Continuous Glucose Sensor (DEXCOM G7 SENSOR) MISC Attach to skin as directed. Replace every 10 days. 3 each 3   Cyanocobalamin  (VITAMIN B12) 1000 MCG TBCR Take by mouth.     dapagliflozin  propanediol (FARXIGA ) 10 MG TABS tablet Take 1 tablet (10 mg total) by mouth daily before breakfast. 90 tablet 0   dicyclomine  (BENTYL ) 10 MG capsule Take 1 capsule (10 mg total) by mouth 3 (three) times daily as needed. 90 capsule 0   loratadine (CLARITIN) 10 MG tablet      meloxicam  (MOBIC ) 15 MG tablet Take 1 tablet (15 mg total) by mouth daily. (Patient taking differently: Take 15 mg by mouth as needed.) 30 tablet 1   nitrofurantoin , macrocrystal-monohydrate, (MACROBID ) 100 MG capsule Take 1 capsule (100 mg total) by mouth 2 (two) times daily. 14 capsule  0   OVER THE COUNTER MEDICATION GI Microb-X 1 tablet daily     OVER THE COUNTER MEDICATION Fodmate 1-2 capsules with each meal     pantoprazole  (PROTONIX ) 20 MG tablet Take 1 tablet (20 mg) by mouth daily. 90 tablet 1   ramipril  (ALTACE ) 2.5 MG capsule Take 1 capsule (2.5 mg total) by mouth daily. 90 capsule 0   triamcinolone  cream (KENALOG ) 0.1 % Apply 1 Application topically 2 (two) times daily. 30 g 0   VITAMIN D  PO Take 5,000 Units by mouth.     No current facility-administered medications on file prior to visit.   Social History   Socioeconomic History   Marital  status: Widowed    Spouse name: Tonette Shed   Number of children: 0   Years of education: Not on file   Highest education level: Bachelor's degree (e.g., BA, AB, BS)  Occupational History   Occupation: accountent  Tobacco Use   Smoking status: Former    Types: Cigarettes   Smokeless tobacco: Never   Tobacco comments:    Not every day smoker in the past  Vaping Use   Vaping status: Never Used  Substance and Sexual Activity   Alcohol use: Yes    Comment: rarely about 2 drinks per month   Drug use: Never   Sexual activity: Not Currently    Birth control/protection: Post-menopausal  Other Topics Concern   Not on file  Social History Narrative   Right handed    Lives in a one story home       Husband passed away unexpectedly 04-Apr-2022   Caffiene 3 mugs a day   Social Drivers of Health   Financial Resource Strain: Low Risk  (07/09/2023)   Overall Financial Resource Strain (CARDIA)    Difficulty of Paying Living Expenses: Not hard at all  Food Insecurity: No Food Insecurity (07/09/2023)   Hunger Vital Sign    Worried About Running Out of Food in the Last Year: Never true    Ran Out of Food in the Last Year: Never true  Transportation Needs: No Transportation Needs (07/09/2023)   PRAPARE - Administrator, Civil Service (Medical): No    Lack of Transportation (Non-Medical): No  Physical Activity: Sufficiently Active (07/09/2023)   Exercise Vital Sign    Days of Exercise per Week: 5 days    Minutes of Exercise per Session: 30 min  Stress: Stress Concern Present (07/09/2023)   [employer] of Occupational Health - Occupational Stress Questionnaire    Feeling of Stress: Very much  Social Connections: Moderately Integrated (07/09/2023)   Social Connection and Isolation Panel    Frequency of Communication with Friends and Family: More than three times a week    Frequency of Social Gatherings with Friends and Family: More than three times a week    Attends Religious  Services: More than 4 times per year    Active Member of [employer] or Organizations: Yes    Attends Banker Meetings: More than 4 times per year    Marital Status: Widowed  Catering manager Violence: Not on file   Family History  Problem Relation Age of Onset   Dementia Mother    Alzheimer's disease Mother    Hypertension Mother    Rheumatic fever Father    Bipolar disorder Sister    Diabetes Sister    High Cholesterol Sister    Bipolar disorder Brother    Depression Maternal Grandmother    Diabetes Maternal  Grandfather    Pancreatic cancer Paternal Grandfather    Colon cancer Neg Hx    Colon polyps Neg Hx    Esophageal cancer Neg Hx    Rectal cancer Neg Hx    Stomach cancer Neg Hx    Allergies  Allergen Reactions   Flagyl [Metronidazole] Diarrhea   Metformin And Related Shortness Of Breath     PE There were no vitals filed for this visit. There is no height or weight on file to calculate BMI.  Physical Exam    Assessment and Plan:        There are no diagnoses linked to this encounter. Clotilda FORBES Pa, CMA

## 2023-09-10 ENCOUNTER — Telehealth (HOSPITAL_COMMUNITY): Payer: Self-pay

## 2023-09-10 ENCOUNTER — Other Ambulatory Visit (HOSPITAL_COMMUNITY)
Admission: RE | Admit: 2023-09-10 | Discharge: 2023-09-10 | Disposition: A | Discharge status: Home / Self Care | Source: Ambulatory Visit | Attending: Obstetrics and Gynecology | Admitting: Obstetrics and Gynecology

## 2023-09-10 ENCOUNTER — Ambulatory Visit (INDEPENDENT_AMBULATORY_CARE_PROVIDER_SITE_OTHER): Admitting: Obstetrics and Gynecology

## 2023-09-10 ENCOUNTER — Encounter: Payer: Self-pay | Admitting: Obstetrics and Gynecology

## 2023-09-10 ENCOUNTER — Other Ambulatory Visit (HOSPITAL_COMMUNITY): Payer: Self-pay

## 2023-09-10 VITALS — BP 120/70 | HR 58 | Temp 97.9°F | Ht 66.25 in | Wt 159.0 lb

## 2023-09-10 DIAGNOSIS — Z113 Encounter for screening for infections with a predominantly sexual mode of transmission: Secondary | ICD-10-CM | POA: Insufficient documentation

## 2023-09-10 DIAGNOSIS — M810 Age-related osteoporosis without current pathological fracture: Secondary | ICD-10-CM

## 2023-09-10 DIAGNOSIS — Z124 Encounter for screening for malignant neoplasm of cervix: Secondary | ICD-10-CM | POA: Insufficient documentation

## 2023-09-10 DIAGNOSIS — Z01419 Encounter for gynecological examination (general) (routine) without abnormal findings: Secondary | ICD-10-CM | POA: Insufficient documentation

## 2023-09-10 DIAGNOSIS — N958 Other specified menopausal and perimenopausal disorders: Secondary | ICD-10-CM | POA: Diagnosis not present

## 2023-09-10 DIAGNOSIS — Z8742 Personal history of other diseases of the female genital tract: Secondary | ICD-10-CM | POA: Diagnosis not present

## 2023-09-10 DIAGNOSIS — Z1331 Encounter for screening for depression: Secondary | ICD-10-CM | POA: Diagnosis not present

## 2023-09-10 MED ORDER — IMVEXXY MAINTENANCE PACK 10 MCG VA INST
VAGINAL_INSERT | VAGINAL | 3 refills | Status: DC
  Filled 2023-09-10: qty 24, 84d supply, fill #0

## 2023-09-10 MED ORDER — IMVEXXY STARTER PACK 10 MCG VA INST
VAGINAL_INSERT | VAGINAL | 0 refills | Status: DC
Start: 2023-09-10 — End: 2023-09-15
  Filled 2023-09-10: qty 18, 46d supply, fill #0

## 2023-09-10 NOTE — Assessment & Plan Note (Signed)
 Cervical cancer screening performed according to ASCCP guidelines. Encouraged annual mammogram screening Colonoscopy UTD DXA scheduled Labs and immunizations with her primary Encouraged safe sexual practices as indicated Encouraged healthy lifestyle practices with diet and exercise For patients under 50-64yo, I recommend 1200mg  calcium daily and 600IU of vitamin D daily.

## 2023-09-10 NOTE — Telephone Encounter (Signed)
 PA request has been Received. New Encounter has been or will be created for follow up. For additional info see Pharmacy Prior Auth telephone encounter from 09/10/23.

## 2023-09-10 NOTE — Telephone Encounter (Signed)
 Pharmacy Patient Advocate Encounter   Received notification from Pt Calls Messages that prior authorization for Imvexxy  Starter Pack 10 mcg inserts is required/requested.   Insurance verification completed.   The patient is insured through [facility] .   Per test claim: Product/service not covered

## 2023-09-10 NOTE — Assessment & Plan Note (Addendum)
 Reviewed safety profile of low dose vaginal estrogen, however reviewed that higher doses have been associated with DVT, breast and uterine cancer.  Will prevent recurrent UTI and decrease PCB

## 2023-09-10 NOTE — Assessment & Plan Note (Signed)
 Managed by endocrine Continue Vit D+ Ca

## 2023-09-10 NOTE — Patient Instructions (Signed)
 For patients under 50-64yo, I recommend 1200mg  calcium  daily and 600IU of vitamin D daily. For patients over 64yo, I recommend 1200mg  calcium  daily and 800IU of vitamin D daily.  Health Maintenance, Female Adopting a healthy lifestyle and getting preventive care are important in promoting health and wellness. Ask your health care provider about: The right schedule for you to have regular tests and exams. Things you can do on your own to prevent diseases and keep yourself healthy. What should I know about diet, weight, and exercise? Eat a healthy diet  Eat a diet that includes plenty of vegetables, fruits, low-fat dairy products, and lean protein. Do not eat a lot of foods that are high in solid fats, added sugars, or sodium. Maintain a healthy weight Body mass index (BMI) is used to identify weight problems. It estimates body fat based on height and weight. Your health care provider can help determine your BMI and help you achieve or maintain a healthy weight. Get regular exercise Get regular exercise. This is one of the most important things you can do for your health. Most adults should: Exercise for at least 150 minutes each week. The exercise should increase your heart rate and make you sweat (moderate-intensity exercise). Do strengthening exercises at least twice a week. This is in addition to the moderate-intensity exercise. Spend less time sitting. Even light physical activity can be beneficial. Watch cholesterol and blood lipids Have your blood tested for lipids and cholesterol at 64 years of age, then have this test every 5 years. Have your cholesterol levels checked more often if: Your lipid or cholesterol levels are high. You are older than 65 years of age. You are at high risk for heart disease. What should I know about cancer screening? Depending on your health history and family history, you may need to have cancer screening at various ages. This may include screening  for: Breast cancer. Cervical cancer. Colorectal cancer. Skin cancer. Lung cancer. What should I know about heart disease, diabetes, and high blood pressure? Blood pressure and heart disease High blood pressure causes heart disease and increases the risk of stroke. This is more likely to develop in people who have high blood pressure readings or are overweight. Have your blood pressure checked: Every 3-5 years if you are 25-57 years of age. Every year if you are 24 years old or older. Diabetes Have regular diabetes screenings. This checks your fasting blood sugar level. Have the screening done: Once every three years after age 62 if you are at a normal weight and have a low risk for diabetes. More often and at a younger age if you are overweight or have a high risk for diabetes. What should I know about preventing infection? Hepatitis B If you have a higher risk for hepatitis B, you should be screened for this virus. Talk with your health care provider to find out if you are at risk for hepatitis B infection. Hepatitis C Testing is recommended for: Everyone born from [birth year] through 1966. Anyone with known risk factors for hepatitis C. Sexually transmitted infections (STIs) Get screened for STIs, including gonorrhea and chlamydia, if: You are sexually active and are younger than 64 years of age. You are older than 64 years of age and your health care provider tells you that you are at risk for this type of infection. Your sexual activity has changed since you were last screened, and you are at increased risk for chlamydia or gonorrhea. Ask your health care provider if  you are at risk. Ask your health care provider about whether you are at high risk for HIV. Your health care provider may recommend a prescription medicine to help prevent HIV infection. If you choose to take medicine to prevent HIV, you should first get tested for HIV. You should then be tested every 3 months for as long as you  are taking the medicine. Osteoporosis and menopause Osteoporosis is a disease in which the bones lose minerals and strength with aging. This can result in bone fractures. If you are 72 years old or older, or if you are at risk for osteoporosis and fractures, ask your health care provider if you should: Be screened for bone loss. Take a calcium  or vitamin D supplement to lower your risk of fractures. Be given hormone replacement therapy (HRT) to treat symptoms of menopause. Follow these instructions at home: Alcohol use Do not drink alcohol if: Your health care provider tells you not to drink. You are pregnant, may be pregnant, or are planning to become pregnant. If you drink alcohol: Limit how much you have to: 0-1 drink a day. Know how much alcohol is in your drink. In the [place] one drink equals one 12 oz bottle of beer (355 mL), one 5 oz glass of wine (148 mL), or one 1 oz glass of hard liquor (44 mL). Lifestyle Do not use any products that contain nicotine or tobacco. These products include cigarettes, chewing tobacco, and vaping devices, such as e-cigarettes. If you need help quitting, ask your health care provider. Do not use street drugs. Do not share needles. Ask your health care provider for help if you need support or information about quitting drugs. General instructions Schedule regular health, dental, and eye exams. Stay current with your vaccines. Tell your health care provider if: You often feel depressed. You have ever been abused or do not feel safe at home. Summary Adopting a healthy lifestyle and getting preventive care are important in promoting health and wellness. Follow your health care provider's instructions about healthy diet, exercising, and getting tested or screened for diseases. Follow your health care provider's instructions on monitoring your cholesterol and blood pressure. This information is not intended to replace advice given to you by your health  care provider. Make sure you discuss any questions you have with your health care provider. Document Revised: 05/28/2020 Document Reviewed: 05/28/2020 Elsevier Patient Education  2024 [employer].

## 2023-09-11 ENCOUNTER — Encounter: Payer: Self-pay | Admitting: Adult Health

## 2023-09-11 ENCOUNTER — Other Ambulatory Visit (HOSPITAL_COMMUNITY): Payer: Self-pay

## 2023-09-11 ENCOUNTER — Ambulatory Visit (INDEPENDENT_AMBULATORY_CARE_PROVIDER_SITE_OTHER): Admitting: Adult Health

## 2023-09-11 DIAGNOSIS — F325 Major depressive disorder, single episode, in full remission: Secondary | ICD-10-CM

## 2023-09-11 DIAGNOSIS — F5102 Adjustment insomnia: Secondary | ICD-10-CM | POA: Diagnosis not present

## 2023-09-11 DIAGNOSIS — F418 Other specified anxiety disorders: Secondary | ICD-10-CM | POA: Diagnosis not present

## 2023-09-11 LAB — HEPATITIS C ANTIBODY: Hepatitis C Ab: NONREACTIVE

## 2023-09-11 LAB — HIV ANTIBODY (ROUTINE TESTING W REFLEX): HIV 1&2 Ab, 4th Generation: NONREACTIVE

## 2023-09-11 LAB — HEPATITIS B SURFACE ANTIGEN: Hepatitis B Surface Ag: NONREACTIVE

## 2023-09-11 LAB — RPR: RPR Ser Ql: NONREACTIVE

## 2023-09-11 MED ORDER — ALPRAZOLAM 0.5 MG PO TABS
0.5000 mg | ORAL_TABLET | Freq: Every evening | ORAL | 2 refills | Status: AC | PRN
  Filled 2023-09-11 – 2023-12-17 (×2): qty 30, 30d supply, fill #0

## 2023-09-11 MED ORDER — BUSPIRONE HCL 10 MG PO TABS
10.0000 mg | ORAL_TABLET | Freq: Three times a day (TID) | ORAL | 2 refills | Status: DC
  Filled 2023-09-11: qty 90, 30d supply, fill #0
  Filled 2023-10-16: qty 90, 30d supply, fill #1
  Filled 2023-11-29: qty 90, 30d supply, fill #2

## 2023-09-11 MED ORDER — CITALOPRAM HYDROBROMIDE 40 MG PO TABS
40.0000 mg | ORAL_TABLET | Freq: Every day | ORAL | 1 refills | Status: AC
  Filled 2023-09-11: qty 90, 90d supply, fill #0
  Filled 2024-01-12 – 2024-01-13 (×2): qty 90, 90d supply, fill #1

## 2023-09-11 MED ORDER — BUPROPION HCL ER (XL) 300 MG PO TB24
300.0000 mg | ORAL_TABLET | Freq: Every day | ORAL | 1 refills | Status: AC
  Filled 2023-09-11: qty 90, 90d supply, fill #0
  Filled 2023-12-17: qty 90, 90d supply, fill #1

## 2023-09-11 NOTE — Progress Notes (Signed)
 [name] [id number] 24-Jul-1960 63 y.o.  Subjective:   Patient ID:  [name] is a 63 y.o. (DOB [birth year]) female.  Chief Complaint: No chief complaint on file.   HPI CHELSIE BUREL presents to the office today for follow-up of MDD, situational anxiety and adjustment insomnia.  Working with Ronal Perfect - therapist since 2019.  Describes mood today as ok. Pleasant. Denies tearfulness. Mood symptoms - denies depression. Reports stable interest and motivation. Reports periods of higher anxiety - more so for awhile. Denies irritability. Denies panic attacks. Reports being fearful at times - but rare now. Reports some worry, rumination and over thinking. Continues to grieve loss of husband - passed away unexpectedly in May 12, 2023. Reports is stable. Stating I feel like I'm doing well. Feels like the Wellbutrin  and Celexa  are helpful for mood symptoms, but would like to consider other options for anxiety. Reports taking Xanax  as needed for sleep. Taking medications as prescribed.  Energy levels improved. Active, has a regular exercise routine.   Enjoys some usual interests and activities. Lives alone with 2 dogs. Brother living with her temporarily. Spending time with family - upcoming family beach trip. Appetite adequate. Weight stable. Sleeps better some nights than others. Averages 6 to 7 hours.  Reports focus and concentration stable. Completing tasks. Managing aspects of household. Retired. Denies SI or HI.  Denies AH or VH. Denies self harm. Denies substance use.  Previous medication trials: Wellbutrin , Celexa    GAD-7    Flowsheet Row Office Visit from 06/19/2022 in [facility]  Total GAD-7 Score 21   PHQ2-9    Flowsheet Row Office Visit from 09/10/2023 in [facility] Office Visit from 04/02/2023 in [facility] Office Visit from 08/06/2022 in [facility] Office Visit from 06/19/2022 in [facility] [place] Office Visit from 08/21/2021 in [facility]  PHQ-2 Total Score 0 0 4 0 0  PHQ-9 Total Score -- 0 8 -- 5   Flowsheet Row UC from 08/14/2023 in [facility] Admission (Discharged) from 05/09/2020 in [facility]  C-SSRS RISK CATEGORY No Risk No Risk     Review of Systems:  Review of Systems  Musculoskeletal:  Negative for gait problem.  Neurological:  Negative for tremors.  Psychiatric/Behavioral:         Please refer to HPI    Medications: I have reviewed the patient's current medications.  Current Outpatient Medications  Medication Sig Dispense Refill   ALPRAZolam  (XANAX ) 0.5 MG tablet Take 1 tablet (0.5 mg total) by mouth at bedtime as needed for anxiety. 30 tablet 2   atorvastatin  (LIPITOR) 10 MG tablet Take 1 tablet (10 mg total) by mouth daily. 90 tablet 1   buPROPion  (WELLBUTRIN  XL) 300 MG 24 hr tablet Take 1 tablet (300 mg total) by mouth daily. 90 tablet 1   citalopram  (CELEXA ) 40 MG tablet Take 1 tablet (40 mg total) by mouth daily. 90 tablet 1   co-enzyme Q-10 30 MG capsule Take 30 mg by mouth daily.     Continuous Glucose Receiver (DEXCOM G7 RECEIVER) DEVI Use as directed. 9 each 1   Continuous Glucose Sensor (DEXCOM G7 SENSOR) MISC Attach to skin as directed. Replace every 10 days. 3 each 3   Cyanocobalamin  (VITAMIN B12) 1000 MCG TBCR Take by mouth.     dapagliflozin  propanediol (FARXIGA )  10 MG TABS tablet Take 1 tablet (10 mg total) by mouth daily before breakfast. 90 tablet 0   dicyclomine  (BENTYL ) 10 MG capsule Take 1 capsule (10 mg total) by mouth 3 (three) times daily as needed. 90 capsule 0   Estradiol  (IMVEXXY  MAINTENANCE PACK) 10 MCG INST Insert 1 tablet nightly into the vagina twice a week. 24 each 3   Estradiol  Starter Pack (IMVEXXY  STARTER PACK) 10 MCG INST Insert 1 tablet nightly into the vagina for 2  weeks.  Then insert 1 tablet nightly into the vagina twice a week. 18 each 0   loratadine (CLARITIN) 10 MG tablet      meloxicam  (MOBIC ) 15 MG tablet Take 1 tablet (15 mg total) by mouth daily. (Patient taking differently: Take 15 mg by mouth as needed.) 30 tablet 1   OVER THE COUNTER MEDICATION GI Microb-X 1 tablet daily     pantoprazole  (PROTONIX ) 20 MG tablet Take 1 tablet (20 mg) by mouth daily. 90 tablet 1   VITAMIN D  PO Take 5,000 Units by mouth.     No current facility-administered medications for this visit.    Medication Side Effects: None  Allergies:  Allergies  Allergen Reactions   Flagyl [Metronidazole] Diarrhea   Metformin And Related Shortness Of Breath    Past Medical History:  Diagnosis Date   Depression    Diabetes mellitus without complication (HCC)    GERD (gastroesophageal reflux disease)    Hypertension    Sleep apnea     Past Medical History, Surgical history, Social history, and Family history were reviewed and updated as appropriate.   Please see review of systems for further details on the patient's review from today.   Objective:   Physical Exam:  LMP 02/02/2013   Physical Exam Constitutional:      General: She is not in acute distress. Musculoskeletal:        General: No deformity.  Neurological:     Mental Status: She is alert and oriented to person, place, and time.     Coordination: Coordination normal.  Psychiatric:        Attention and Perception: Attention and perception normal. She does not perceive auditory or visual hallucinations.        Mood and Affect: Mood normal. Mood is not anxious or depressed. Affect is not labile, blunt, angry or inappropriate.        Speech: Speech normal.        Behavior: Behavior normal.        Thought Content: Thought content normal. Thought content is not paranoid or delusional. Thought content does not include homicidal or suicidal ideation. Thought content does not include homicidal or suicidal  plan.        Cognition and Memory: Cognition and memory normal.        Judgment: Judgment normal.     Comments: Insight intact     Lab Review:     Component Value Date/Time   NA 139 06/26/2023 0748   K 4.9 06/26/2023 0748   CL 101 06/26/2023 0748   CO2 24 06/26/2023 0748   GLUCOSE 127 (H) 06/26/2023 0748   GLUCOSE 111 (H) 04/02/2023 1202   BUN 23 06/26/2023 0748   CREATININE 0.81 06/26/2023 0748   CALCIUM  9.7 06/26/2023 0748   PROT 7.2 06/26/2023 0748   ALBUMIN 4.7 06/26/2023 0748   AST 15 06/26/2023 0748   ALT 16 06/26/2023 0748   ALKPHOS 85 06/26/2023 0748   BILITOT 0.6 06/26/2023 0748  GFRNONAA >60 05/07/2020 1430   GFRAA >60 08/06/2017 1924       Component Value Date/Time   WBC 6.4 04/02/2023 1202   RBC 4.29 04/02/2023 1202   HGB 14.3 04/02/2023 1202   HCT 42.0 04/02/2023 1202   PLT 365.0 04/02/2023 1202   MCV 98.1 04/02/2023 1202   MCH 31.8 02/05/2014 2146   MCHC 33.9 04/02/2023 1202   RDW 13.2 04/02/2023 1202   LYMPHSABS 2.5 04/02/2023 1202   MONOABS 0.7 04/02/2023 1202   EOSABS 0.4 04/02/2023 1202   BASOSABS 0.0 04/02/2023 1202    No results found for: POCLITH, LITHIUM   No results found for: PHENYTOIN, PHENOBARB, VALPROATE, CBMZ   .res Assessment: Plan:    Treatment Plan/Recommendations:  Current medications: Wellbutrin  XL 300mg  Celexa  40mg  daily Xanax  0.5mg  at hs for sleep  Add Buspar  10mg  TID - 1/2 tab TID x 7 days.  RTC 3 months  20 minutes spent dedicated to the care of this patient on the date of this encounter to include pre-visit review of records, ordering of medication, post visit documentation, and face-to-face time with the patient discussing MDD, situational anxiety and adjustment insomnia. Discussed continuing current medication regimen.   Discussed potential benefits, risk, and side effects of benzodiazepines to include potential risk of tolerance and dependence, as well as possible drowsiness.  Advised patient not  to drive if experiencing drowsiness and to take lowest possible effective dose to minimize risk of dependence and tolerance.   There are no diagnoses linked to this encounter.   Please see After Visit Summary for patient specific instructions.  Future Appointments  Date Time Provider Department Center  09/11/2023  4:30 PM Lawerance Angeline Mattocks, NP [facility] None  09/14/2023  2:00 PM [facility] 1 [facility]  10/06/2023  9:40 AM Joshua Debby CROME, MD [facility] None  01/05/2024  3:00 PM Nida, Ethelle ORN, MD [facility] None    No orders of the defined types were placed in this encounter.   -------------------------------

## 2023-09-14 ENCOUNTER — Ambulatory Visit (HOSPITAL_BASED_OUTPATIENT_CLINIC_OR_DEPARTMENT_OTHER)
Admission: RE | Admit: 2023-09-14 | Discharge: 2023-09-14 | Disposition: A | Discharge status: Home / Self Care | Source: Ambulatory Visit | Attending: "Endocrinology | Admitting: "Endocrinology

## 2023-09-14 ENCOUNTER — Other Ambulatory Visit (HOSPITAL_COMMUNITY): Payer: Self-pay

## 2023-09-14 ENCOUNTER — Other Ambulatory Visit: Payer: Self-pay | Admitting: "Endocrinology

## 2023-09-14 ENCOUNTER — Ambulatory Visit: Payer: Self-pay | Admitting: Obstetrics and Gynecology

## 2023-09-14 DIAGNOSIS — Z794 Long term (current) use of insulin: Secondary | ICD-10-CM | POA: Diagnosis not present

## 2023-09-14 DIAGNOSIS — M810 Age-related osteoporosis without current pathological fracture: Secondary | ICD-10-CM | POA: Diagnosis not present

## 2023-09-14 DIAGNOSIS — E118 Type 2 diabetes mellitus with unspecified complications: Secondary | ICD-10-CM

## 2023-09-14 DIAGNOSIS — Z780 Asymptomatic menopausal state: Secondary | ICD-10-CM | POA: Diagnosis not present

## 2023-09-14 DIAGNOSIS — E1142 Type 2 diabetes mellitus with diabetic polyneuropathy: Secondary | ICD-10-CM

## 2023-09-14 DIAGNOSIS — R87611 Atypical squamous cells cannot exclude high grade squamous intraepithelial lesion on cytologic smear of cervix (ASC-H): Secondary | ICD-10-CM

## 2023-09-14 DIAGNOSIS — E1169 Type 2 diabetes mellitus with other specified complication: Secondary | ICD-10-CM | POA: Insufficient documentation

## 2023-09-15 ENCOUNTER — Other Ambulatory Visit (HOSPITAL_COMMUNITY): Payer: Self-pay

## 2023-09-15 ENCOUNTER — Telehealth: Payer: Self-pay

## 2023-09-15 DIAGNOSIS — N958 Other specified menopausal and perimenopausal disorders: Secondary | ICD-10-CM

## 2023-09-15 LAB — CYTOLOGY - PAP
Chlamydia: NEGATIVE
Comment: NEGATIVE
Comment: NEGATIVE
Comment: NEGATIVE
Comment: NORMAL
Diagnosis: HIGH — AB
High risk HPV: NEGATIVE
Neisseria Gonorrhea: NEGATIVE
Trichomonas: NEGATIVE

## 2023-09-15 MED ORDER — ESTRADIOL 10 MCG VA TABS
1.0000 | ORAL_TABLET | VAGINAL | 3 refills | Status: AC
  Filled 2023-09-15 – 2023-10-12 (×2): qty 24, 84d supply, fill #0
  Filled 2024-01-12: qty 24, 84d supply, fill #1

## 2023-09-15 MED ORDER — ESTRADIOL 10 MCG VA TABS
ORAL_TABLET | VAGINAL | 0 refills | Status: DC
  Filled 2023-09-15: qty 16, 28d supply, fill #0

## 2023-09-15 MED ORDER — DAPAGLIFLOZIN PROPANEDIOL 10 MG PO TABS
10.0000 mg | ORAL_TABLET | Freq: Every day | ORAL | 0 refills | Status: DC
  Filled 2023-09-15: qty 30, 30d supply, fill #0

## 2023-09-15 NOTE — Telephone Encounter (Signed)
 Vagifem  tab rx'd. Dc'd imvexxy  since not covered. Please inform patient

## 2023-09-15 NOTE — Addendum Note (Signed)
 Addended by: DALLIE VERA GAILS on: 09/15/2023 03:22 PM   Modules accepted: Orders

## 2023-09-15 NOTE — Telephone Encounter (Signed)
 A PA was submitted for Imvexxy  Starter Pack inserts  This request has received an Unfavorable outcome - DENIED  An eAppeal may be available. View the bottom of the request to see if an eAppeal is available along with any additional information provided by [employer].  Please advise

## 2023-09-16 ENCOUNTER — Other Ambulatory Visit (HOSPITAL_COMMUNITY): Payer: Self-pay

## 2023-09-22 ENCOUNTER — Other Ambulatory Visit (HOSPITAL_COMMUNITY): Payer: Self-pay

## 2023-09-22 ENCOUNTER — Telehealth: Payer: Self-pay | Admitting: Psychiatry

## 2023-09-22 ENCOUNTER — Encounter: Payer: Self-pay | Admitting: Psychiatry

## 2023-09-22 ENCOUNTER — Other Ambulatory Visit: Payer: Self-pay | Admitting: Internal Medicine

## 2023-09-22 ENCOUNTER — Ambulatory Visit (INDEPENDENT_AMBULATORY_CARE_PROVIDER_SITE_OTHER): Admitting: Psychiatry

## 2023-09-22 DIAGNOSIS — F411 Generalized anxiety disorder: Secondary | ICD-10-CM | POA: Diagnosis not present

## 2023-09-22 MED ORDER — PANTOPRAZOLE SODIUM 20 MG PO TBEC
20.0000 mg | DELAYED_RELEASE_TABLET | Freq: Every day | ORAL | 3 refills | Status: AC
  Filled 2023-09-22: qty 90, 90d supply, fill #0
  Filled 2023-12-25: qty 90, 90d supply, fill #1
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
## 2023-09-22 NOTE — Telephone Encounter (Signed)
 Ms. [name], finerty are scheduled for a virtual visit with your provider today.    Just as we do with appointments in the office, we must obtain your consent to participate.  Your consent will be active for this visit and any virtual visit you may have with one of our providers in the next 365 days.    If you have a MyChart account, I can also send a copy of this consent to you electronically.  All virtual visits are billed to your insurance company just like a traditional visit in the office.  As this is a virtual visit, video technology does not allow for your provider to perform a traditional examination.  This may limit your provider's ability to fully assess your condition.  If your provider identifies any concerns that need to be evaluated in person or the need to arrange testing such as labs, EKG, etc, we will make arrangements to do so.    Although advances in technology are sophisticated, we cannot ensure that it will always work on either your end or our end.  If the connection with a video visit is poor, we may have to switch to a telephone visit.  With either a video or telephone visit, we are not always able to ensure that we have a secure connection.   I need to obtain your verbal consent now.   Are you willing to proceed with your visit today?   LYRICA MCCLARTY has provided verbal consent on 09/22/2023 for a virtual visit (video or telephone).   Silvano Pacini, [facility] 09/22/2023  11:00 AM

## 2023-09-23 DIAGNOSIS — F4323 Adjustment disorder with mixed anxiety and depressed mood: Secondary | ICD-10-CM | POA: Diagnosis not present

## 2023-09-24 ENCOUNTER — Telehealth: Payer: Self-pay

## 2023-09-24 NOTE — Telephone Encounter (Signed)
 Patient states she wants to make sure you received her medical records from [facility]. She states she is scheduled for a colposcopy with Dr Dallie on 10-02-23. She said she wants to make sure you get those before her appt because she had this done before & they had to end up putting her to sleep to do it. The operative note from 2022 is in the chart. She wants to just make sure you have the office visit notes from [place] also.

## 2023-09-29 ENCOUNTER — Ambulatory Visit (INDEPENDENT_AMBULATORY_CARE_PROVIDER_SITE_OTHER): Admitting: "Endocrinology

## 2023-09-29 ENCOUNTER — Telehealth: Payer: Self-pay | Admitting: Obstetrics and Gynecology

## 2023-09-29 ENCOUNTER — Other Ambulatory Visit (HOSPITAL_COMMUNITY): Payer: Self-pay

## 2023-09-29 ENCOUNTER — Encounter: Payer: Self-pay | Admitting: "Endocrinology

## 2023-09-29 VITALS — BP 124/76 | HR 60 | Ht 66.0 in | Wt 163.0 lb

## 2023-09-29 DIAGNOSIS — M810 Age-related osteoporosis without current pathological fracture: Secondary | ICD-10-CM

## 2023-09-29 DIAGNOSIS — E1169 Type 2 diabetes mellitus with other specified complication: Secondary | ICD-10-CM

## 2023-09-29 DIAGNOSIS — Z713 Dietary counseling and surveillance: Secondary | ICD-10-CM | POA: Diagnosis not present

## 2023-09-29 DIAGNOSIS — I10 Essential (primary) hypertension: Secondary | ICD-10-CM

## 2023-09-29 DIAGNOSIS — Z794 Long term (current) use of insulin: Secondary | ICD-10-CM

## 2023-09-29 DIAGNOSIS — E782 Mixed hyperlipidemia: Secondary | ICD-10-CM

## 2023-09-29 DIAGNOSIS — Z7984 Long term (current) use of oral hypoglycemic drugs: Secondary | ICD-10-CM

## 2023-09-29 LAB — POCT GLYCOSYLATED HEMOGLOBIN (HGB A1C): HbA1c, POC (controlled diabetic range): 6.4 % (ref 0.0–7.0)

## 2023-09-29 MED ORDER — DAPAGLIFLOZIN PROPANEDIOL 10 MG PO TABS
10.0000 mg | ORAL_TABLET | Freq: Every day | ORAL | 1 refills | Status: AC
  Filled 2023-09-29 – 2023-10-08 (×2): qty 90, 90d supply, fill #0
  Filled 2024-01-12 – 2024-01-13 (×2): qty 90, 90d supply, fill #1

## 2023-09-29 MED ORDER — ALENDRONATE SODIUM 70 MG PO TABS
70.0000 mg | ORAL_TABLET | ORAL | 3 refills | Status: AC
  Filled 2023-09-29: qty 12, 84d supply, fill #0
  Filled 2024-01-12: qty 12, 84d supply, fill #1

## 2023-09-29 NOTE — Telephone Encounter (Signed)
 Insurance denied imvexxy , will try vagifem .

## 2023-09-29 NOTE — Telephone Encounter (Signed)
 Spoke with patient and she would like to do colpo in OR. Appointment for Friday changed to pre op consult.

## 2023-09-29 NOTE — Progress Notes (Signed)
 Endocrinology follow-up note       09/29/2023, 12:43 PM   Subjective:    Patient ID: [name], female    DOB: [birth year].  [name] is being seen in follow-up after she was seen in consultation for management of currently controlled asymptomatic diabetes requested by  Joshua Debby LITTIE, MD.   Past Medical History:  Diagnosis Date   Depression    Diabetes mellitus without complication (HCC)    GERD (gastroesophageal reflux disease)    Hypertension    Sleep apnea     Past Surgical History:  Procedure Laterality Date   COLONOSCOPY  2013   hypo plastic polyps at [facility] in [place]   COLONOSCOPY WITH ESOPHAGOGASTRODUODENOSCOPY (EGD)  11/11/2021   DIAGNOSTIC LAPAROSCOPY     DILATION AND CURETTAGE OF UTERUS N/A 05/09/2020   Procedure: CERVICAL DILATATION AND ENDOCERVICAL CURETTAGE;  Surgeon: Storm Setter, DO;  Location: [facility];  Service: Gynecology;  Laterality: N/A;   LABIOPLASTY Left 08/10/2017   Procedure: LABIAPLASTY REVISION;  Surgeon: Timmie Norris, MD;  Location: [facility];  Service: Gynecology;  Laterality: Left;  Labiaplasty revision   UPPER GASTROINTESTINAL ENDOSCOPY     WRIST SURGERY  11/2022    Social History   Socioeconomic History   Marital status: Widowed    Spouse name: Tonette Shed   Number of children: 0   Years of education: Not on file   Highest education level: Bachelor's degree (e.g., BA, AB, BS)  Occupational History   Occupation: accountent  Tobacco Use   Smoking status: Former    Types: Cigarettes   Smokeless tobacco: Never   Tobacco comments:    Not every day smoker in the past  Vaping Use   Vaping status: Never Used  Substance and Sexual Activity   Alcohol use: Yes    Comment: social   Drug use: Never   Sexual activity: Yes    Birth control/protection: Post-menopausal  Other Topics Concern   Not on file  Social History Narrative   Right handed    Lives in a one  story home       Husband passed away unexpectedly 04-27-22   Caffiene 3 mugs a day   Social Drivers of Health   Financial Resource Strain: Low Risk  (07/09/2023)   Overall Financial Resource Strain (CARDIA)    Difficulty of Paying Living Expenses: Not hard at all  Food Insecurity: No Food Insecurity (07/09/2023)   Hunger Vital Sign    Worried About Running Out of Food in the Last Year: Never true    Ran Out of Food in the Last Year: Never true  Transportation Needs: No Transportation Needs (07/09/2023)   PRAPARE - Administrator, Civil Service (Medical): No    Lack of Transportation (Non-Medical): No  Physical Activity: Sufficiently Active (07/09/2023)   Exercise Vital Sign    Days of Exercise per Week: 5 days    Minutes of Exercise per Session: 30 min  Stress: Stress Concern Present (07/09/2023)   [employer] of Occupational Health - Occupational Stress Questionnaire    Feeling of Stress: Very much  Social Connections: Moderately Integrated (07/09/2023)   Social Connection and Isolation Panel    Frequency of Communication with Friends and Family: More than three times a week    Frequency of Social Gatherings with Friends and Family: More than three times a week    Attends Religious Services: More than 4 times per year    Active  Member of Clubs or Organizations: Yes    Attends Engineer, structural: More than 4 times per year    Marital Status: Widowed    Family History  Problem Relation Age of Onset   Dementia Mother    Alzheimer's disease Mother    Hypertension Mother    Rheumatic fever Father    Bipolar disorder Sister    Diabetes Sister    High Cholesterol Sister    Bipolar disorder Brother    Depression Maternal Grandmother    Diabetes Maternal Grandfather    Pancreatic cancer Paternal Grandfather    Colon cancer Neg Hx    Colon polyps Neg Hx    Esophageal cancer Neg Hx    Rectal cancer Neg Hx    Stomach cancer Neg Hx     Outpatient  Encounter Medications as of 09/29/2023  Medication Sig   alendronate  (FOSAMAX ) 70 MG tablet Take 1 tablet (70 mg total) by mouth every 7 (seven) days. Take with a full glass of water on an empty stomach.   ALPRAZolam  (XANAX ) 0.5 MG tablet Take 1 tablet (0.5 mg total) by mouth at bedtime as needed for anxiety.   atorvastatin  (LIPITOR) 10 MG tablet Take 1 tablet (10 mg total) by mouth daily.   buPROPion  (WELLBUTRIN  XL) 300 MG 24 hr tablet Take 1 tablet (300 mg total) by mouth daily.   busPIRone  (BUSPAR ) 10 MG tablet Take 1 tablet (10 mg total) by mouth 3 (three) times daily.   citalopram  (CELEXA ) 40 MG tablet Take 1 tablet (40 mg total) by mouth daily.   co-enzyme Q-10 30 MG capsule Take 30 mg by mouth daily.   Continuous Glucose Receiver (DEXCOM G7 RECEIVER) DEVI Use as directed.   Continuous Glucose Sensor (DEXCOM G7 SENSOR) MISC Attach to skin as directed. Replace every 10 days.   Cyanocobalamin  (VITAMIN B12) 1000 MCG TBCR Take by mouth.   dapagliflozin  propanediol (FARXIGA ) 10 MG TABS tablet Take 1 tablet (10 mg total) by mouth daily before breakfast.   dicyclomine  (BENTYL ) 10 MG capsule Take 1 capsule (10 mg total) by mouth 3 (three) times daily as needed.   Estradiol  (VAGIFEM ) 10 MCG TABS vaginal tablet Insert 1 tablet inside the vagina nightly for 2 weeks. Then insert 1 tablet inside the vagina twice a week.   [START ON 10/16/2023] Estradiol  10 MCG TABS vaginal tablet Place 1 tablet (10 mcg total) vaginally 2 (two) times a week.   loratadine (CLARITIN) 10 MG tablet    meloxicam  (MOBIC ) 15 MG tablet Take 1 tablet (15 mg total) by mouth daily. (Patient taking differently: Take 15 mg by mouth as needed.)   OVER THE COUNTER MEDICATION GI Microb-X 1 tablet daily   pantoprazole  (PROTONIX ) 20 MG tablet Take 1 tablet (20 mg) by mouth daily.   VITAMIN D  PO Take 5,000 Units by mouth.   [DISCONTINUED] dapagliflozin  propanediol (FARXIGA ) 10 MG TABS tablet Take 1 tablet (10 mg total) by mouth daily before  breakfast.   No facility-administered encounter medications on file as of 09/29/2023.    ALLERGIES: Allergies  Allergen Reactions   Flagyl [Metronidazole] Diarrhea   Metformin And Related Shortness Of Breath    VACCINATION STATUS: Immunization History  Administered Date(s) Administered   Influenza, Seasonal, Injecte, Preservative Fre 04/02/2023   Influenza,inj,Quad PF,6+ Mos 10/08/2021   PFIZER Comirnaty(Gray Top)Covid-19 Tri-Sucrose Vaccine 05/28/2020   PFIZER(Purple Top)SARS-COV-2 Vaccination 03/30/2019, 04/20/2019, 11/23/2019   PNEUMOCOCCAL CONJUGATE-20 06/11/2020   Tdap 10/06/2008, 12/20/2018   Zoster Recombinant(Shingrix) 06/11/2020, 09/10/2020  Diabetes She presents for her follow-up diabetic visit. She has type 2 diabetes mellitus. Onset time: She was diagnosed at approximate age of 61 years. Her disease course has been stable. There are no hypoglycemic associated symptoms. Pertinent negatives for hypoglycemia include no confusion, headaches, pallor or seizures. There are no diabetic associated symptoms. Pertinent negatives for diabetes include no chest pain, no polydipsia, no polyphagia and no polyuria. There are no hypoglycemic complications. Symptoms are stable. There are no diabetic complications. Risk factors for coronary artery disease include dyslipidemia, diabetes mellitus, post-menopausal and tobacco exposure. Her weight is fluctuating minimally. She is following a generally unhealthy diet. When asked about meal planning, she reported none. She has not had a previous visit with a dietitian. She participates in exercise intermittently. Her home blood glucose trend is fluctuating minimally. Her breakfast blood glucose range is generally 130-140 mg/dl. Her lunch blood glucose range is generally 130-140 mg/dl. Her dinner blood glucose range is generally 130-140 mg/dl. Her overall blood glucose range is 130-140 mg/dl. (She continues to have controlled glycemic profile at target  with point-of-care A1c of 6.4%.  Her cysts CGM shows 92% time range, 8% level 1 hyperglycemia.  She has no hypoglycemia.    ) An ACE inhibitor/angiotensin II receptor blocker is being taken.  Hyperlipidemia This is a chronic problem. The current episode started more than 1 year ago. The problem is controlled. Exacerbating diseases include diabetes. Pertinent negatives include no chest pain, myalgias or shortness of breath. Current antihyperlipidemic treatment includes statins. Risk factors for coronary artery disease include diabetes mellitus, dyslipidemia, post-menopausal and family history.    Osteoporosis: She gives prior history of osteoporosis, was not on treatment.  Her repeat bone density shows T-score of -2.9 on the lumbar spine.  She wishes to be treated.  She has no prior history of fragility fractures.  She has a half an inch of weight loss over the years.  She has a stable dental implant, monitoring and following closely with her dentist.  She Objective:       09/29/2023   10:13 AM 09/10/2023    9:14 AM 09/03/2023    1:50 PM  Vitals with BMI  Height 5' 6 5' 6.25 5' 6  Weight 163 lbs 159 lbs 161 lbs 4 oz  BMI 26.32 25.46 26.04  Systolic 124 120 893  Diastolic 76 70 72  Pulse 60 58 76    BP 124/76   Pulse 60   Ht 5' 6 (1.676 m)   Wt 163 lb (73.9 kg)   LMP 02/02/2013   BMI 26.31 kg/m   Wt Readings from Last 3 Encounters:  09/29/23 163 lb (73.9 kg)  09/10/23 159 lb (72.1 kg)  09/03/23 161 lb 4 oz (73.1 kg)     CMP ( most recent) CMP     Component Value Date/Time   NA 139 06/26/2023 0748   K 4.9 06/26/2023 0748   CL 101 06/26/2023 0748   CO2 24 06/26/2023 0748   GLUCOSE 127 (H) 06/26/2023 0748   GLUCOSE 111 (H) 04/02/2023 1202   BUN 23 06/26/2023 0748   CREATININE 0.81 06/26/2023 0748   CALCIUM  9.7 06/26/2023 0748   PROT 7.2 06/26/2023 0748   ALBUMIN 4.7 06/26/2023 0748   AST 15 06/26/2023 0748   ALT 16 06/26/2023 0748   ALKPHOS 85 06/26/2023 0748    BILITOT 0.6 06/26/2023 0748   GFRNONAA >60 05/07/2020 1430   GFRAA >60 08/06/2017 1924     Diabetic Labs (most recent): Lab  Results  Component Value Date   HGBA1C 6.4 09/29/2023   HGBA1C 6.4 07/06/2023   HGBA1C 6.4 12/31/2022   MICROALBUR <0.7 04/02/2023     Lipid Panel ( most recent) Lipid Panel     Component Value Date/Time   CHOL 151 06/26/2023 0748   TRIG 60 06/26/2023 0748   HDL 52 06/26/2023 0748   CHOLHDL 2.9 06/26/2023 0748   CHOLHDL 3 02/07/2021 1618   VLDL 16.6 02/07/2021 1618   LDLCALC 87 06/26/2023 0748   LABVLDL 12 06/26/2023 0748        Assessment & Plan:   1. Type 2 diabetes mellitus with other specified complication, with long-term current use of insulin  (HCC) - [name] has currently uncontrolled symptomatic type 2 DM since  63 years of age.  She continues to have controlled glycemic profile at target with point-of-care A1c of 6.4%.  Her cysts CGM shows 92% time range, 8% level 1 hyperglycemia.  She has no hypoglycemia.      Recent labs reviewed. - I had a long discussion with her about the possible risk factors and  the pathology behind its diabetes and its complications. -her diabetes is complicated by comorbid hyperlipidemia and she remains at a high risk for more acute and chronic complications which include CAD, CVA, CKD, retinopathy, and neuropathy. These are all discussed in detail with her.  - I discussed all available options of managing her diabetes including de-escalation of medications. I have counseled her on Food as Medicine by adopting a Whole Food , Plant Predominant  ( WFPP) nutrition as recommended by [employer] of Lifestyle Medicine. Patient is encouraged to switch to  unprocessed or minimally processed  complex starch, adequate protein intake (mainly plant source), minimal liquid fat, plenty of fruits, and vegetables. -  she is advised to stick to a routine mealtimes to eat 3 complete meals a day and snack only when  necessary ( to snack only to correct hypoglycemia BG <70 day time or <100 at night).  - she acknowledges that there is a room for improvement in her food and drink choices. - Suggestion is made for her to avoid simple carbohydrates  from her diet including Cakes, Sweet Desserts, Ice Cream, Soda (diet and regular), Sweet Tea, Candies, Chips, Cookies, Store Bought Juices, Alcohol , Artificial Sweeteners,  Coffee Creamer, and Sugar-free Products, Lemonade. This will help patient to have more stable blood glucose profile and potentially avoid unintended weight gain.    - I have approached her with the following individualized plan to manage  her diabetes and patient agrees:   - she presents with controlled glycemic profile. - Based on her glycemic response, she will not need insulin  treatment.  She is benefiting and advised to continue Farxiga  10 mg p.o. daily at breakfast.    This patient has a chance to reverse her diabetes.  She is encouraged to continue to utilize her Dexcom CGM as long as she has access for it.  - she is encouraged to call clinic for blood glucose levels less than 70 or above 200 mg /dl. She will be considered for low-dose GLP-1 receptor agonists on subsequent visits if necessary.  - Specific targets for  A1c;  LDL, HDL,  and Triglycerides were discussed with the patient.  2) Blood Pressure /Hypertension: Her blood pressure is tightly controlled to target.   She has not tolerated even low-dose ACE inhibitors which was causing dizziness.   3) Lipids/Hyperlipidemia:   Review of her recent lipid  panel showed  controlled  LDL improving at 87.  She is advised to continue atorvastatin  10 mg p.o. daily at bedtime.  Side effects and precautions discussed with her.  4)  Weight/Diet:  Body mass index is 26.31 kg/m.  -    she is  a candidate for some weight loss.  I discussed with her the fact that loss of 5 - 10% of her  current body weight will have the most impact on her  diabetes management.  The above detailed  ACLM recommendations for nutrition, exercise, sleep, social life, avoidance of risky substances, the need for restorative sleep   information will also detailed on discharge instructions.  5) osteoporosis-no recent history of fragility fracture nor significant height loss.  She wishes to be treated.  Options were discussed with her to treat osteoporosis given T-score of -2.9 at the lumbar spine.  She agrees with my recommendation of starting Fosamax  70 mg p.o. weekly.  Side effects and precautions discussed with her.  Expectation of treatment for 5 to 10 years were discussed with plan to repeat bone density every 2 years.  6) Chronic Care/Health Maintenance:  -she  is on ACEI/ARB and Statin medications and  is encouraged to initiate and continue to follow up with Ophthalmology, Dentist,  Podiatrist at least yearly or according to recommendations, and advised to   stay away from smoking. I have recommended yearly flu vaccine and pneumonia vaccine at least every 5 years; moderate intensity exercise for up to 150 minutes weekly; and  sleep for 7- 9 hours a day.  She gives prior history of osteoporosis, not currently on treatment.  She will have a repeat bone density before her next visit.     - she is  advised to maintain close follow up with Joshua Debby CROME, MD for primary care needs, as well as her other providers for optimal and coordinated care.    I spent  40  minutes in the care of the patient today including review of labs from CMP, Lipids, Thyroid  Function, Hematology (current and previous including abstractions from other facilities); face-to-face time discussing  her blood glucose readings/logs, discussing hypoglycemia and hyperglycemia episodes and symptoms, medications doses, her options of short and long term treatment based on the latest standards of care / guidelines;  discussion about incorporating lifestyle medicine;  and documenting the  encounter. Risk reduction counseling performed per USPSTF guidelines to reduce cardiovascular risk factors.     Please refer to Patient Instructions for Blood Glucose Monitoring and Insulin /Medications Dosing Guide  in media tab for additional information. Please  also refer to  Patient Self Inventory in the Media  tab for reviewed elements of pertinent patient history.  Gina CROME Fields participated in the discussions, expressed understanding, and voiced agreement with the above plans.  All questions were answered to her satisfaction. she is encouraged to contact clinic should she have any questions or concerns prior to her return visit.   Follow up plan: - Return in about 6 months (around 03/28/2024) for Meter/CGM/Logs, A1c here, F/U with Pre-visit Labs, Meter/CGM/Logs, A1c here.  Ranny Earl, MD [facility] [address] [place], [place] [address] Phone: [phone]  Fax: [phone]    09/29/2023, 12:43 PM  This note was partially dictated with voice recognition software. Similar sounding words can be transcribed inadequately or may not  be corrected upon review.

## 2023-09-29 NOTE — Telephone Encounter (Signed)
 Spoke with patient and advised her per Dr. Dallie that we may or may not have problems in the office with the colpo. I offered to change her appointment to a pre op. Patient is going to take a few hours to think about it and will call back.

## 2023-09-30 NOTE — Progress Notes (Signed)
 63 y.o. [id number] postmenopausal female with hx of LEEP (1990), hx of cosmetic labioplasty (2022), osteoporosis (managed by endocrine) here for surgical consultation. Widowed, now dating. GYN at [facility] left. Last seen last year with abnormal PAP.  Medical hx is notable for HTN, T2DM, HLD, GERD, OSA, MDD, anxiety, sinus bradycardia on EKG.  She desires colposcopy under anesthesia, declines local anesthesia. No SOB or CP with climbing stairs. Smoker: No, former 04/02/2023 EKG with sinus bradycardia (otherwise unremarkable) 04/02/23 CBC and 06/26/23 CMP unremarkable  09/29/23 A1c 6.4  Sexually active: Yes H/o abnl PAP: yes Last PAP: 01/30/20 ASC-H, HPV neg > colpo ECC in OR - Focal koilocytic atypia (consistent with HPV effect) 05/27/21 ASCUS, +HRHPV April 2022 ECC with HPV effect No pathology/cytology sent     Component Value Date/Time   DIAGPAP (A) 09/10/2023 1009    - Atypical squamous cells, cannot exclude high grade squamous   DIAGPAP intraepithelial lesion (ASC-H) (A) 09/10/2023 1009   HPVHIGH Negative 09/10/2023 1009   ADEQPAP  09/10/2023 1009    Satisfactory for evaluation; transformation zone component PRESENT.    GYN HISTORY: Prior LEEP Abnormal PAP 2022  OB History  Gravida Para Term Preterm AB Living  8    8   SAB IAB Ectopic Multiple Live Births  8        # Outcome Date GA Lbr Len/2nd Weight Sex Type Anes PTL Lv  8 SAB           7 SAB           6 SAB           5 SAB           4 SAB           3 SAB           2 SAB           1 SAB            Past Medical History:  Diagnosis Date   Depression    Diabetes mellitus without complication (HCC)    GERD (gastroesophageal reflux disease)    Hypertension    Sleep apnea    Past Surgical History:  Procedure Laterality Date   COLONOSCOPY  2013   hypo plastic polyps at [facility] in [place]   COLONOSCOPY WITH ESOPHAGOGASTRODUODENOSCOPY (EGD)  11/11/2021   DIAGNOSTIC LAPAROSCOPY     DILATION AND CURETTAGE OF UTERUS  N/A 05/09/2020   Procedure: CERVICAL DILATATION AND ENDOCERVICAL CURETTAGE;  Surgeon: Storm Setter, DO;  Location: [facility];  Service: Gynecology;  Laterality: N/A;   LABIOPLASTY Left 08/10/2017   Procedure: LABIAPLASTY REVISION;  Surgeon: Timmie Norris, MD;  Location: [facility];  Service: Gynecology;  Laterality: Left;  Labiaplasty revision   UPPER GASTROINTESTINAL ENDOSCOPY     WRIST SURGERY  11/2022   Current Outpatient Medications on File Prior to Visit  Medication Sig Dispense Refill   alendronate  (FOSAMAX ) 70 MG tablet Take 1 tablet (70 mg total) by mouth every 7 (seven) days. Take with a full glass of water on an empty stomach. 12 tablet 3   ALPRAZolam  (XANAX ) 0.5 MG tablet Take 1 tablet (0.5 mg total) by mouth at bedtime as needed for anxiety. 30 tablet 2   atorvastatin  (LIPITOR) 10 MG tablet Take 1 tablet (10 mg total) by mouth daily. 90 tablet 1   buPROPion  (WELLBUTRIN  XL) 300 MG 24 hr tablet Take 1 tablet (300 mg total) by mouth daily. 90 tablet  1   busPIRone  (BUSPAR ) 10 MG tablet Take 1 tablet (10 mg total) by mouth 3 (three) times daily. 90 tablet 2   citalopram  (CELEXA ) 40 MG tablet Take 1 tablet (40 mg total) by mouth daily. 90 tablet 1   Continuous Glucose Receiver (DEXCOM G7 RECEIVER) DEVI Use as directed. 9 each 1   Continuous Glucose Sensor (DEXCOM G7 SENSOR) MISC Attach to skin as directed. Replace every 10 days. 3 each 3   Cyanocobalamin  (VITAMIN B12) 1000 MCG TBCR Take by mouth.     dapagliflozin  propanediol (FARXIGA ) 10 MG TABS tablet Take 1 tablet (10 mg total) by mouth daily before breakfast. 90 tablet 1   dicyclomine  (BENTYL ) 10 MG capsule Take 1 capsule (10 mg total) by mouth 3 (three) times daily as needed. 90 capsule 0   Estradiol  (VAGIFEM ) 10 MCG TABS vaginal tablet Insert 1 tablet inside the vagina nightly for 2 weeks. Then insert 1 tablet inside the vagina twice a week. 18 tablet 0   [START ON 10/16/2023] Estradiol  10 MCG TABS  vaginal tablet Place 1 tablet (10 mcg total) vaginally 2 (two) times a week. 24 tablet 3   loratadine (CLARITIN) 10 MG tablet      meloxicam  (MOBIC ) 15 MG tablet Take 1 tablet (15 mg total) by mouth daily. (Patient taking differently: Take 15 mg by mouth as needed.) 30 tablet 1   pantoprazole  (PROTONIX ) 20 MG tablet Take 1 tablet (20 mg) by mouth daily. 90 tablet 3   VITAMIN D  PO Take 5,000 Units by mouth.     No current facility-administered medications on file prior to visit.   Allergies  Allergen Reactions   Flagyl [Metronidazole] Diarrhea   Metformin And Related Shortness Of Breath     PE Today's Vitals   10/02/23 0958  BP: 100/62  Pulse: 62  Temp: 97.9 F (36.6 C)  TempSrc: Oral  SpO2: 99%  Weight: 163 lb (73.9 kg)  Height: 5' 6.25 (1.683 m)    Body mass index is 26.11 kg/m.  Physical Exam Vitals reviewed. Exam conducted with a chaperone present.  Constitutional:      General: She is not in acute distress.    Appearance: Normal appearance.  HENT:     Head: Normocephalic and atraumatic.     Nose: Nose normal.  Eyes:     Extraocular Movements: Extraocular movements intact.     Conjunctiva/sclera: Conjunctivae normal.  Cardiovascular:     Rate and Rhythm: Normal rate and regular rhythm.     Heart sounds: No murmur heard.    No friction rub. No gallop.  Pulmonary:     Effort: Pulmonary effort is normal. No respiratory distress.     Breath sounds: Normal breath sounds. No stridor. No wheezing, rhonchi or rales.  Genitourinary:    Exam position: Lithotomy position.     Vagina: Normal. No vaginal discharge.     Cervix: Normal. No cervical motion tenderness, discharge or lesion.     Uterus: Normal. Not enlarged and not tender.      Adnexa: Right adnexa normal and left adnexa normal.     Comments: Vulvovaginal atrophy Small cervix Musculoskeletal:        General: Normal range of motion.     Cervical back: Normal range of motion.  Neurological:     General: No  focal deficit present.     Mental Status: She is alert.  Psychiatric:        Mood and Affect: Mood normal.  Behavior: Behavior normal.      Assessment and Plan:        Papanicolaou smear of cervix with atypical squamous cells cannot exclude high grade squamous intraepithelial lesion (ASC-H) Assessment & Plan: We discussed cervical cancer screening and colposcopic biopsies. Per ASCCP guidelines treatment is recommended with excisional procedure. Given prior LEEP >25yr ago, I recommend proceeding with a LEEP.  Discussed outpatient procedure. Reviewed that recovery is usually 1 weeks. Reviewed risks including infection, bleeding, damage to surrounding structures. Reviewed risk of recurrence, is higher with more severe dysplasia.  Recommend NPO prior to midnight and reviewed medication to take on day of surgery. Dicussed use of NSAIDS as needed for pain postoperatively.  Preop checklist: Antibiotics: none DVT ppx: SCDs Postop visit: 2 week Additional clearance: none, normal EKG and labs this year as noted. METS >4.   Orders: -     Ambulatory Referral For Surgery Scheduling   Gina LULLA Pa, MD

## 2023-09-30 NOTE — H&P (View-Only) (Signed)
 63 y.o. [id number] postmenopausal female with hx of LEEP (1990), hx of cosmetic labioplasty (2022), osteoporosis (managed by endocrine) here for surgical consultation. Widowed, now dating. GYN at [facility] left. Last seen last year with abnormal PAP.  Medical hx is notable for HTN, T2DM, HLD, GERD, OSA, MDD, anxiety, sinus bradycardia on EKG.  She desires colposcopy under anesthesia, declines local anesthesia. No SOB or CP with climbing stairs. Smoker: No, former 04/02/2023 EKG with sinus bradycardia (otherwise unremarkable) 04/02/23 CBC and 06/26/23 CMP unremarkable  09/29/23 A1c 6.4  Sexually active: Yes H/o abnl PAP: yes Last PAP: 01/30/20 ASC-H, HPV neg > colpo ECC in OR - Focal koilocytic atypia (consistent with HPV effect) 05/27/21 ASCUS, +HRHPV April 2022 ECC with HPV effect No pathology/cytology sent     Component Value Date/Time   DIAGPAP (A) 09/10/2023 1009    - Atypical squamous cells, cannot exclude high grade squamous   DIAGPAP intraepithelial lesion (ASC-H) (A) 09/10/2023 1009   HPVHIGH Negative 09/10/2023 1009   ADEQPAP  09/10/2023 1009    Satisfactory for evaluation; transformation zone component PRESENT.    GYN HISTORY: Prior LEEP Abnormal PAP 2022  OB History  Gravida Para Term Preterm AB Living  8    8   SAB IAB Ectopic Multiple Live Births  8        # Outcome Date GA Lbr Len/2nd Weight Sex Type Anes PTL Lv  8 SAB           7 SAB           6 SAB           5 SAB           4 SAB           3 SAB           2 SAB           1 SAB            Past Medical History:  Diagnosis Date   Depression    Diabetes mellitus without complication (HCC)    GERD (gastroesophageal reflux disease)    Hypertension    Sleep apnea    Past Surgical History:  Procedure Laterality Date   COLONOSCOPY  2013   hypo plastic polyps at [facility] in [place]   COLONOSCOPY WITH ESOPHAGOGASTRODUODENOSCOPY (EGD)  11/11/2021   DIAGNOSTIC LAPAROSCOPY     DILATION AND CURETTAGE OF UTERUS  N/A 05/09/2020   Procedure: CERVICAL DILATATION AND ENDOCERVICAL CURETTAGE;  Surgeon: Storm Setter, DO;  Location: [facility];  Service: Gynecology;  Laterality: N/A;   LABIOPLASTY Left 08/10/2017   Procedure: LABIAPLASTY REVISION;  Surgeon: Timmie Norris, MD;  Location: [facility];  Service: Gynecology;  Laterality: Left;  Labiaplasty revision   UPPER GASTROINTESTINAL ENDOSCOPY     WRIST SURGERY  11/2022   Current Outpatient Medications on File Prior to Visit  Medication Sig Dispense Refill   alendronate  (FOSAMAX ) 70 MG tablet Take 1 tablet (70 mg total) by mouth every 7 (seven) days. Take with a full glass of water on an empty stomach. 12 tablet 3   ALPRAZolam  (XANAX ) 0.5 MG tablet Take 1 tablet (0.5 mg total) by mouth at bedtime as needed for anxiety. 30 tablet 2   atorvastatin  (LIPITOR) 10 MG tablet Take 1 tablet (10 mg total) by mouth daily. 90 tablet 1   buPROPion  (WELLBUTRIN  XL) 300 MG 24 hr tablet Take 1 tablet (300 mg total) by mouth daily. 90 tablet  1   busPIRone  (BUSPAR ) 10 MG tablet Take 1 tablet (10 mg total) by mouth 3 (three) times daily. 90 tablet 2   citalopram  (CELEXA ) 40 MG tablet Take 1 tablet (40 mg total) by mouth daily. 90 tablet 1   Continuous Glucose Receiver (DEXCOM G7 RECEIVER) DEVI Use as directed. 9 each 1   Continuous Glucose Sensor (DEXCOM G7 SENSOR) MISC Attach to skin as directed. Replace every 10 days. 3 each 3   Cyanocobalamin  (VITAMIN B12) 1000 MCG TBCR Take by mouth.     dapagliflozin  propanediol (FARXIGA ) 10 MG TABS tablet Take 1 tablet (10 mg total) by mouth daily before breakfast. 90 tablet 1   dicyclomine  (BENTYL ) 10 MG capsule Take 1 capsule (10 mg total) by mouth 3 (three) times daily as needed. 90 capsule 0   Estradiol  (VAGIFEM ) 10 MCG TABS vaginal tablet Insert 1 tablet inside the vagina nightly for 2 weeks. Then insert 1 tablet inside the vagina twice a week. 18 tablet 0   [START ON 10/16/2023] Estradiol  10 MCG TABS  vaginal tablet Place 1 tablet (10 mcg total) vaginally 2 (two) times a week. 24 tablet 3   loratadine (CLARITIN) 10 MG tablet      meloxicam  (MOBIC ) 15 MG tablet Take 1 tablet (15 mg total) by mouth daily. (Patient taking differently: Take 15 mg by mouth as needed.) 30 tablet 1   pantoprazole  (PROTONIX ) 20 MG tablet Take 1 tablet (20 mg) by mouth daily. 90 tablet 3   VITAMIN D  PO Take 5,000 Units by mouth.     No current facility-administered medications on file prior to visit.   Allergies  Allergen Reactions   Flagyl [Metronidazole] Diarrhea   Metformin And Related Shortness Of Breath     PE Today's Vitals   10/02/23 0958  BP: 100/62  Pulse: 62  Temp: 97.9 F (36.6 C)  TempSrc: Oral  SpO2: 99%  Weight: 163 lb (73.9 kg)  Height: 5' 6.25 (1.683 m)    Body mass index is 26.11 kg/m.  Physical Exam Vitals reviewed. Exam conducted with a chaperone present.  Constitutional:      General: She is not in acute distress.    Appearance: Normal appearance.  HENT:     Head: Normocephalic and atraumatic.     Nose: Nose normal.  Eyes:     Extraocular Movements: Extraocular movements intact.     Conjunctiva/sclera: Conjunctivae normal.  Cardiovascular:     Rate and Rhythm: Normal rate and regular rhythm.     Heart sounds: No murmur heard.    No friction rub. No gallop.  Pulmonary:     Effort: Pulmonary effort is normal. No respiratory distress.     Breath sounds: Normal breath sounds. No stridor. No wheezing, rhonchi or rales.  Genitourinary:    Exam position: Lithotomy position.     Vagina: Normal. No vaginal discharge.     Cervix: Normal. No cervical motion tenderness, discharge or lesion.     Uterus: Normal. Not enlarged and not tender.      Adnexa: Right adnexa normal and left adnexa normal.     Comments: Vulvovaginal atrophy Small cervix Musculoskeletal:        General: Normal range of motion.     Cervical back: Normal range of motion.  Neurological:     General: No  focal deficit present.     Mental Status: She is alert.  Psychiatric:        Mood and Affect: Mood normal.  Behavior: Behavior normal.      Assessment and Plan:        Papanicolaou smear of cervix with atypical squamous cells cannot exclude high grade squamous intraepithelial lesion (ASC-H) Assessment & Plan: We discussed cervical cancer screening and colposcopic biopsies. Per ASCCP guidelines treatment is recommended with excisional procedure. Given prior LEEP >25yr ago, I recommend proceeding with a LEEP.  Discussed outpatient procedure. Reviewed that recovery is usually 1 weeks. Reviewed risks including infection, bleeding, damage to surrounding structures. Reviewed risk of recurrence, is higher with more severe dysplasia.  Recommend NPO prior to midnight and reviewed medication to take on day of surgery. Dicussed use of NSAIDS as needed for pain postoperatively.  Preop checklist: Antibiotics: none DVT ppx: SCDs Postop visit: 2 week Additional clearance: none, normal EKG and labs this year as noted. METS >4.   Orders: -     Ambulatory Referral For Surgery Scheduling   Vera LULLA Pa, MD

## 2023-10-02 ENCOUNTER — Ambulatory Visit: Admitting: Obstetrics and Gynecology

## 2023-10-02 ENCOUNTER — Encounter: Payer: Self-pay | Admitting: Obstetrics and Gynecology

## 2023-10-02 ENCOUNTER — Other Ambulatory Visit (HOSPITAL_COMMUNITY): Payer: Self-pay

## 2023-10-02 VITALS — BP 100/62 | HR 62 | Temp 97.9°F | Ht 66.25 in | Wt 163.0 lb

## 2023-10-02 DIAGNOSIS — R87611 Atypical squamous cells cannot exclude high grade squamous intraepithelial lesion on cytologic smear of cervix (ASC-H): Secondary | ICD-10-CM

## 2023-10-02 NOTE — Assessment & Plan Note (Addendum)
 We discussed cervical cancer screening and colposcopic biopsies. Per ASCCP guidelines treatment is recommended with excisional procedure. Given prior LEEP >22yr ago, I recommend proceeding with a LEEP.  Discussed outpatient procedure. Reviewed that recovery is usually 1 weeks. Reviewed risks including infection, bleeding, damage to surrounding structures. Reviewed risk of recurrence, is higher with more severe dysplasia.  Recommend NPO prior to midnight and reviewed medication to take on day of surgery. Dicussed use of NSAIDS as needed for pain postoperatively.  Preop checklist: Antibiotics: none DVT ppx: SCDs Postop visit: 2 week Additional clearance: none, normal EKG and labs this year as noted. METS >4.

## 2023-10-02 NOTE — Patient Instructions (Addendum)
 Preoperative instructions: Nothing to eat or drink after midnight, unless instructed differently regarding clear liquids by the anesthesia team at [facility] health. Do not take any medications on the day of surgery, except those listed below: Protonix  Please follow all other instructions as provided by our surgical scheduler at [facility] and the anesthesia team at [facility] health.  Postoperative instructions: Take ibuprofen  as prescribed and over-the-counter Tylenol  as needed.

## 2023-10-05 ENCOUNTER — Ambulatory Visit (INDEPENDENT_AMBULATORY_CARE_PROVIDER_SITE_OTHER): Admitting: Psychiatry

## 2023-10-05 DIAGNOSIS — F411 Generalized anxiety disorder: Secondary | ICD-10-CM

## 2023-10-05 NOTE — Progress Notes (Addendum)
      [facility] Counselor/Therapist Progress Note  Patient ID: [name], MRN: [id number],    Date: 10/05/2023  Time Spent: 48 minutes start time 11:00 Amend time 11:48 AM Virtual Visit via Video Note Connected with patient by a telemedicine/telehealth application, with their informed consent, and verified patient privacy and that I am speaking with the correct person using two identifiers. I discussed the limitations, risks, security and privacy concerns of performing psychotherapy and the availability of in person appointments. I also discussed with the patient that there may be a patient responsible charge related to this service. The patient expressed understanding and agreed to proceed. I discussed the treatment planning with the patient. The patient was provided an opportunity to ask questions and all were answered. The patient agreed with the plan and demonstrated an understanding of the instructions. The patient was advised to call  our office if  symptoms worsen or feel they are in a crisis state and need immediate contact.   Therapist Location: home Patient Location: home    Treatment Type: Individual Therapy  Reported Symptoms: anxiety, worry, rumination, sleep issues, triggered responses, body shocks   Mental Status Exam:  Appearance:   Casual     Behavior:  Appropriate  Motor:  Normal  Speech/Language:   Normal Rate  Affect:  Appropriate  Mood:  anxious  Thought process:  normal  Thought content:    WNL  Sensory/Perceptual disturbances:    WNL  Orientation:  oriented to person, place, time/date, and situation  Attention:  Good  Concentration:  Good  Memory:  WNL  Fund of knowledge:   Good  Insight:    Good  Judgment:   Good  Impulse Control:  Good   Risk Assessment: Danger to Self:  No Self-injurious Behavior: No Danger to Others: No Duty to Warn:no Physical Aggression / Violence:No  Access to Firearms a concern: No  Gang Involvement:No    Subjective: Met with patient via virtual session. She shared that she is feeling more anxiety since she last session.  She went on to share that talking about it all has opened up dad's death as well as the time before he died that was harder on them. Developed treatment plan and goals with patient. Did grounding exercises.  Encourage patient to take things 1 step at a time and to recognize that processing will progress as it needs to in session.  Patient was encouraged to work on paying attention more when she feels the body shocks and to start working on some journaling to try and help figure out what needs to be processed in session.  Patient agreed to work on journaling and exercising to release negative emotions from her body appropriately.  Interventions: Solution-Oriented/Positive Psychology  Diagnosis:   ICD-10-CM   1. Generalized anxiety disorder  F41.1       Plan: Patient is to practice grounding skills to decrease anxiety symptoms.  Patient is to journal and to exercise to release negative emotions appropriately.  Patient is to continue working with provider Regina Mozingo NP for medication management.  Silvano Pacini, [facility]

## 2023-10-06 ENCOUNTER — Other Ambulatory Visit (HOSPITAL_COMMUNITY): Payer: Self-pay

## 2023-10-06 ENCOUNTER — Other Ambulatory Visit: Payer: Self-pay | Admitting: Internal Medicine

## 2023-10-06 ENCOUNTER — Encounter: Payer: Self-pay | Admitting: Internal Medicine

## 2023-10-06 ENCOUNTER — Ambulatory Visit: Admitting: Internal Medicine

## 2023-10-06 VITALS — BP 120/78 | HR 59 | Temp 98.2°F | Resp 16 | Ht 66.25 in | Wt 161.6 lb

## 2023-10-06 DIAGNOSIS — E118 Type 2 diabetes mellitus with unspecified complications: Secondary | ICD-10-CM

## 2023-10-06 DIAGNOSIS — F5104 Psychophysiologic insomnia: Secondary | ICD-10-CM | POA: Diagnosis not present

## 2023-10-06 DIAGNOSIS — I10 Essential (primary) hypertension: Secondary | ICD-10-CM

## 2023-10-06 DIAGNOSIS — E785 Hyperlipidemia, unspecified: Secondary | ICD-10-CM

## 2023-10-06 DIAGNOSIS — Z23 Encounter for immunization: Secondary | ICD-10-CM | POA: Diagnosis not present

## 2023-10-06 DIAGNOSIS — R202 Paresthesia of skin: Secondary | ICD-10-CM | POA: Insufficient documentation

## 2023-10-06 MED ORDER — ESZOPICLONE 2 MG PO TABS
2.0000 mg | ORAL_TABLET | Freq: Every evening | ORAL | 0 refills | Status: DC | PRN
  Filled 2023-10-06: qty 90, 90d supply, fill #0

## 2023-10-06 NOTE — Progress Notes (Signed)
 Subjective:  Patient ID: [name], female    DOB: 1960-08-17  Age: 63 y.o. MRN: [id number]  CC: Diabetes and Hypertension   HPI [name] presents for f/up ---  Discussed the use of AI scribe software for clinical note transcription with the patient, who gave verbal consent to proceed.  History of Present Illness [name] is a 63 year old female who presents with sleep disturbances and nerve pain.  She has been experiencing significant sleep disturbances for the past year and a half, characterized by difficulty staying asleep. She wakes up several times during the night and often remains awake for two to three hours. These issues began following her husband's death.  She describes experiencing 'shooting nerve pains' that feel like her 'nerves are on fire,' which contribute to her sleep disturbances. She has recently started trauma therapy to address these issues.  She has a history of using Xanax  for sleep but has stopped due to concerns about dependency. She occasionally uses it but is trying to avoid regular use. She has been on psychiatric medications for 25 years and is currently taking Buspar , although she has not noticed any improvement in her symptoms yet.  She reports low blood pressure and heart rate, which have been consistently low for about the same duration as her sleep issues. Her endocrinologist has taken her off ramipril  due to these low readings.  She remains physically active and feels fine during activities. No dizziness or lightheadedness. She has not received a flu shot yet but is open to getting one. She is unsure about her vaccination status for pneumonia and RSV.     Outpatient Medications Prior to Visit  Medication Sig Dispense Refill   alendronate  (FOSAMAX ) 70 MG tablet Take 1 tablet (70 mg total) by mouth every 7 (seven) days. Take with a full glass of water on an empty stomach. 12 tablet 3   ALPRAZolam  (XANAX ) 0.5 MG tablet Take 1  tablet (0.5 mg total) by mouth at bedtime as needed for anxiety. 30 tablet 2   ASPIRIN 81 PO Take 81 mg by mouth daily in the afternoon.     atorvastatin  (LIPITOR) 10 MG tablet Take 1 tablet (10 mg total) by mouth daily. 90 tablet 1   buPROPion  (WELLBUTRIN  XL) 300 MG 24 hr tablet Take 1 tablet (300 mg total) by mouth daily. 90 tablet 1   busPIRone  (BUSPAR ) 10 MG tablet Take 1 tablet (10 mg total) by mouth 3 (three) times daily. 90 tablet 2   citalopram  (CELEXA ) 40 MG tablet Take 1 tablet (40 mg total) by mouth daily. 90 tablet 1   Continuous Glucose Receiver (DEXCOM G7 RECEIVER) DEVI Use as directed. 9 each 1   Continuous Glucose Sensor (DEXCOM G7 SENSOR) MISC Attach to skin as directed. Replace every 10 days. 3 each 3   Cyanocobalamin  (VITAMIN B12) 1000 MCG TBCR Take by mouth.     dapagliflozin  propanediol (FARXIGA ) 10 MG TABS tablet Take 1 tablet (10 mg total) by mouth daily before breakfast. 90 tablet 1   dicyclomine  (BENTYL ) 10 MG capsule Take 1 capsule (10 mg total) by mouth 3 (three) times daily as needed. 90 capsule 0   [START ON 10/16/2023] Estradiol  10 MCG TABS vaginal tablet Place 1 tablet (10 mcg total) vaginally 2 (two) times a week. 24 tablet 3   loratadine (CLARITIN) 10 MG tablet      meloxicam  (MOBIC ) 15 MG tablet Take 1 tablet (15 mg total) by mouth daily. (Patient  taking differently: Take 15 mg by mouth as needed.) 30 tablet 1   pantoprazole  (PROTONIX ) 20 MG tablet Take 1 tablet (20 mg) by mouth daily. 90 tablet 3   VITAMIN D  PO Take 5,000 Units by mouth.     No facility-administered medications prior to visit.    ROS Review of Systems  Hematological:  Negative for adenopathy. Does not bruise/bleed easily.  Psychiatric/Behavioral:  Positive for dysphoric mood and sleep disturbance. Negative for agitation, behavioral problems, confusion, hallucinations, self-injury and suicidal ideas. The patient is not nervous/anxious.     Objective:  BP 120/78 (BP Location: Left Arm,  Patient Position: Sitting, Cuff Size: Normal)   Pulse (!) 59   Temp 98.2 F (36.8 C) (Oral)   Resp 16   Ht 5' 6.25 (1.683 m)   Wt 161 lb 9.6 oz (73.3 kg)   LMP 02/02/2013   SpO2 97%   BMI 25.89 kg/m   BP Readings from Last 3 Encounters:  10/06/23 120/78  10/02/23 100/62  09/29/23 124/76    Wt Readings from Last 3 Encounters:  10/06/23 161 lb 9.6 oz (73.3 kg)  10/02/23 163 lb (73.9 kg)  09/29/23 163 lb (73.9 kg)    Physical Exam Vitals reviewed.  Constitutional:      Appearance: Normal appearance.  HENT:     Mouth/Throat:     Mouth: Mucous membranes are moist.  Eyes:     General: No scleral icterus.    Conjunctiva/sclera: Conjunctivae normal.  Cardiovascular:     Rate and Rhythm: Bradycardia present.     Heart sounds: No murmur heard.    No friction rub. No gallop.  Pulmonary:     Effort: Pulmonary effort is normal.     Breath sounds: No stridor. No wheezing, rhonchi or rales.  Abdominal:     General: Abdomen is flat.     Palpations: There is no mass.     Tenderness: There is no abdominal tenderness. There is no guarding.     Hernia: No hernia is present.  Musculoskeletal:        General: Normal range of motion.     Cervical back: Neck supple.     Right lower leg: No edema.     Left lower leg: No edema.  Skin:    General: Skin is warm and dry.  Neurological:     General: No focal deficit present.     Mental Status: She is alert and oriented to person, place, and time.  Psychiatric:        Attention and Perception: Attention normal.        Mood and Affect: Mood normal.        Speech: Speech normal.        Behavior: Behavior normal.        Thought Content: Thought content normal. Thought content is not paranoid or delusional. Thought content does not include homicidal or suicidal ideation.        Cognition and Memory: Cognition normal.     Lab Results  Component Value Date   WBC 6.4 04/02/2023   HGB 14.3 04/02/2023   HCT 42.0 04/02/2023   PLT 365.0  04/02/2023   GLUCOSE 127 (H) 06/26/2023   CHOL 151 06/26/2023   TRIG 60 06/26/2023   HDL 52 06/26/2023   LDLCALC 87 06/26/2023   ALT 16 06/26/2023   AST 15 06/26/2023   NA 139 06/26/2023   K 4.9 06/26/2023   CL 101 06/26/2023   CREATININE 0.81 06/26/2023  BUN 23 06/26/2023   CO2 24 06/26/2023   TSH 2.430 06/26/2023   INR 1.0 09/18/2006   HGBA1C 6.4 09/29/2023   MICROALBUR <0.7 04/02/2023    DG Bone Density Result Date: 09/15/2023 EXAM: DUAL X-RAY ABSORPTIOMETRY (DXA) FOR BONE MINERAL DENSITY 09/14/2023 2:20 pm CLINICAL DATA:  63 year old Female Postmenopausal. Screening for Osteoporosis TECHNIQUE: An axial (e.g., hips, spine) and/or appendicular (e.g., radius) exam was performed, as appropriate, using GE Secretary/administrator at [employer]. Images are obtained for bone mineral density measurement and are not obtained for diagnostic purposes. [id number] Exclusions: None. COMPARISON:  None. FINDINGS: Scan quality: Good. LUMBAR SPINE (L1-L4): BMD (in g/cm2): 0.849 T-score: -2.8 Z-score: -1.3 LEFT FEMORAL NECK: BMD (in g/cm2): 0.779 T-score: -1.9 Z-score: -0.5 LEFT TOTAL HIP: BMD (in g/cm2): 0.810 T-score: -1.6 Z-score: -0.5 RIGHT FEMORAL NECK: BMD (in g/cm2): 0.820 T-score: -1.6 Z-score: -0.2 RIGHT TOTAL HIP: BMD (in g/cm2): 0.806 T-score: -1.6 Z-score: -0.5 FRAX 10-YEAR PROBABILITY OF FRACTURE: FRAX not reported as the lowest BMD is not in the osteopenia range. IMPRESSION: Osteoporosis based on BMD. Fracture risk is increased. Increased risk is based on low BMD. RECOMMENDATIONS: 1. All patients should optimize calcium  and vitamin D  intake. 2. Consider FDA-approved medical therapies in postmenopausal women and men aged 63 years and older, based on the following: - A hip or vertebral (clinical or morphometric) fracture - T-score less than or equal to -2.5 and secondary causes have been excluded. - Low bone mass (T-score between -1.0 and -2.5) and a 10-year probability  of a hip fracture greater than or equal to 3% or a 10-year probability of a major osteoporosis-related fracture greater than or equal to 20% based on the [place] -adapted WHO algorithm. - Clinician judgment and/or patient preferences may indicate treatment for people with 10-year fracture probabilities above or below these levels 3. Patients with diagnosis of osteoporosis or at high risk for fracture should have regular bone mineral density tests. For patients eligible for Medicare, routine testing is allowed once every 2 years. The testing frequency can be increased to one year for patients who have rapidly progressing disease, those who are receiving or discontinuing medical therapy to restore bone mass, or have additional risk factors. Electronically Signed   By: Dina  Arceo M.D.   On: 09/15/2023 16:03    Assessment & Plan:  Need for immunization against influenza -     Flu vaccine trivalent PF, 6mos and older(Flulaval,Afluria,Fluarix,Fluzone)  Type II diabetes mellitus with manifestations (HCC) -     HM Diabetes Foot Exam  Primary hypertension  Psychophysiological insomnia -     Eszopiclone ; Take 1 tablet (2 mg total) by mouth at bedtime as needed for sleep. Take immediately before bedtime  Dispense: 90 tablet; Refill: 0     Follow-up: Return in about 3 months (around 01/05/2024).  Debby Molt, MD

## 2023-10-06 NOTE — Patient Instructions (Signed)
 Insomnia Insomnia is a sleep disorder that makes it difficult to fall asleep or stay asleep. Insomnia can cause fatigue, low energy, difficulty concentrating, mood swings, and poor performance at work or school. There are three different ways to classify insomnia: Difficulty falling asleep. Difficulty staying asleep. Waking up too early in the morning. Any type of insomnia can be long-term (chronic) or short-term (acute). Both are common. Short-term insomnia usually lasts for 3 months or less. Chronic insomnia occurs at least three times a week for longer than 3 months. What are the causes? Insomnia may be caused by another condition, situation, or substance, such as: Having certain mental health conditions, such as anxiety and depression. Using caffeine, alcohol , tobacco, or drugs. Having gastrointestinal conditions, such as gastroesophageal reflux disease (GERD). Having certain medical conditions. These include: Asthma. Alzheimer's disease. Stroke. Chronic pain. An overactive thyroid  gland (hyperthyroidism). Other sleep disorders, such as restless legs syndrome and sleep apnea. Menopause. Sometimes, the cause of insomnia may not be known. What increases the risk? Risk factors for insomnia include: Gender. Females are affected more often than males. Age. Insomnia is more common as people get older. Stress and certain medical and mental health conditions. Lack of exercise. Having an irregular work schedule. This may include working night shifts and traveling between different time zones. What are the signs or symptoms? If you have insomnia, the main symptom is having trouble falling asleep or having trouble staying asleep. This may lead to other symptoms, such as: Feeling tired or having low energy. Feeling nervous about going to sleep. Not feeling rested in the morning. Having trouble concentrating. Feeling irritable, anxious, or depressed. How is this diagnosed? This condition  may be diagnosed based on: Your symptoms and medical history. Your health care provider may ask about: Your sleep habits. Any medical conditions you have. Your mental health. A physical exam. How is this treated? Treatment for insomnia depends on the cause. Treatment may focus on treating an underlying condition that is causing the insomnia. Treatment may also include: Medicines to help you sleep. Counseling or therapy. Lifestyle adjustments to help you sleep better. Follow these instructions at home: Eating and drinking  Limit or avoid alcohol , caffeinated beverages, and products that contain nicotine and tobacco, especially close to bedtime. These can disrupt your sleep. Do not eat a large meal or eat spicy foods right before bedtime. This can lead to digestive discomfort that can make it hard for you to sleep. Sleep habits  Keep a sleep diary to help you and your health care provider figure out what could be causing your insomnia. Write down: When you sleep. When you wake up during the night. How well you sleep and how rested you feel the next day. Any side effects of medicines you are taking. What you eat and drink. Make your bedroom a dark, comfortable place where it is easy to fall asleep. Put up shades or blackout curtains to block light from outside. Use a white noise machine to block noise. Keep the temperature cool. Limit screen use before bedtime. This includes: Not watching TV. Not using your smartphone, tablet, or computer. Stick to a routine that includes going to bed and waking up at the same times every day and night. This can help you fall asleep faster. Consider making a quiet activity, such as reading, part of your nighttime routine. Try to avoid taking naps during the day so that you sleep better at night. Get out of bed if you are still awake after  15 minutes of trying to sleep. Keep the lights down, but try reading or doing a quiet activity. When you feel  sleepy, go back to bed. General instructions Take over-the-counter and prescription medicines only as told by your health care provider. Exercise regularly as told by your health care provider. However, avoid exercising in the hours right before bedtime. Use relaxation techniques to manage stress. Ask your health care provider to suggest some techniques that may work well for you. These may include: Breathing exercises. Routines to release muscle tension. Visualizing peaceful scenes. Make sure that you drive carefully. Do not drive if you feel very sleepy. Keep all follow-up visits. This is important. Contact a health care provider if: You are tired throughout the day. You have trouble in your daily routine due to sleepiness. You continue to have sleep problems, or your sleep problems get worse. Get help right away if: You have thoughts about hurting yourself or someone else. Get help right away if you feel like you may hurt yourself or others, or have thoughts about taking your own life. Go to your nearest emergency room or: Call 911. Call the National Suicide Prevention Lifeline at 2232757840 or 988. This is open 24 hours a day. Text the Crisis Text Line at 657-529-4371. Summary Insomnia is a sleep disorder that makes it difficult to fall asleep or stay asleep. Insomnia can be long-term (chronic) or short-term (acute). Treatment for insomnia depends on the cause. Treatment may focus on treating an underlying condition that is causing the insomnia. Keep a sleep diary to help you and your health care provider figure out what could be causing your insomnia. This information is not intended to replace advice given to you by your health care provider. Make sure you discuss any questions you have with your health care provider. Document Revised: 12/17/2020 Document Reviewed: 12/17/2020 Elsevier Patient Education  2024 [employer].

## 2023-10-07 ENCOUNTER — Other Ambulatory Visit (HOSPITAL_COMMUNITY): Payer: Self-pay

## 2023-10-08 ENCOUNTER — Other Ambulatory Visit: Payer: Self-pay

## 2023-10-08 ENCOUNTER — Other Ambulatory Visit (HOSPITAL_COMMUNITY): Payer: Self-pay

## 2023-10-09 ENCOUNTER — Other Ambulatory Visit (HOSPITAL_COMMUNITY): Payer: Self-pay

## 2023-10-12 ENCOUNTER — Other Ambulatory Visit: Payer: Self-pay

## 2023-10-12 ENCOUNTER — Encounter: Payer: Self-pay | Admitting: Psychiatry

## 2023-10-12 ENCOUNTER — Ambulatory Visit: Admitting: Psychiatry

## 2023-10-12 ENCOUNTER — Other Ambulatory Visit (HOSPITAL_COMMUNITY): Payer: Self-pay

## 2023-10-12 DIAGNOSIS — F411 Generalized anxiety disorder: Secondary | ICD-10-CM

## 2023-10-12 MED ORDER — ATORVASTATIN CALCIUM 10 MG PO TABS
10.0000 mg | ORAL_TABLET | Freq: Every day | ORAL | 1 refills | Status: AC
  Filled 2023-10-12: qty 90, 90d supply, fill #0
  Filled 2024-01-28: qty 90, 90d supply, fill #1

## 2023-10-12 NOTE — Progress Notes (Signed)
 [facility] Counselor/Therapist Progress Note  Patient ID: [name], MRN: [id number],    Date: 10/12/2023  Time Spent: 52 minutes start time 2:03 PM end 2:55 PM Virtual Visit via Video Note Connected with patient by a telemedicine/telehealth application, with their informed consent, and verified patient privacy and that I am speaking with the correct person using two identifiers. I discussed the limitations, risks, security and privacy concerns of performing psychotherapy and the availability of in person appointments. I also discussed with the patient that there may be a patient responsible charge related to this service. The patient expressed understanding and agreed to proceed. I discussed the treatment planning with the patient. The patient was provided an opportunity to ask questions and all were answered. The patient agreed with the plan and demonstrated an understanding of the instructions. The patient was advised to call  our office if  symptoms worsen or feel they are in a crisis state and need immediate contact.   Therapist Location: home Patient Location: home    Treatment Type: Individual Therapy  Reported Symptoms: anxiety, crying spell, sleep issues, grief issues  Mental Status Exam:  Appearance:   Well Groomed     Behavior:  Appropriate  Motor:  Normal  Speech/Language:   Normal Rate  Affect:  Appropriate  Mood:  anxious  Thought process:  normal  Thought content:    WNL  Sensory/Perceptual disturbances:    WNL  Orientation:  oriented to person, place, time/date, and situation  Attention:  Good  Concentration:  Good  Memory:  WNL  Fund of knowledge:   Good  Insight:    Good  Judgment:   Good  Impulse Control:  Good   Risk Assessment: Danger to Self:  No Self-injurious Behavior: No Danger to Others: No Duty to Warn:no Physical Aggression / Violence:No  Access to Firearms a concern: No  Gang Involvement:No   Subjective: Met with patient via  virtual session. She shared her brother has been living with her 6 months. She shared that he uses her stove and doesn't know how to use it. He was supposed to do the grocery shopping  and he has stopped for whatever reason.  Did processing set on that issue SUDS level 7, negative cognition I am taken advantage of, felt hurt and sadness in her hip and chest.  Patient reduced SUDS level to 5.  She was able to realize more through the processing that the physical shocks in her body are connected to thinking negatively.  Discussed multiple things that she could do before bedtime to try and help with her sleep.  To see if that keeps her from waking up in the morning with the physical shocks which is been what is happening.  Patient was encouraged to allow processing to take place over the left next week.  Patient was also able to realize that she needs to confront her brother on him not doing what he had agreed to when he first moved in.  Discussed how to do that.  Interventions: Solution-Oriented/Positive Psychology, Insight-Oriented, and BSP  Diagnosis:   ICD-10-CM   1. Generalized anxiety disorder  F41.1       Plan:  Patient is to practice grounding skills to decrease anxiety symptoms.  Patient is to follow plans to communicate with her brother about the situation.  Patient is to journal and to exercise to release negative emotions appropriately.  Patient is to continue working with provider Regina Mozingo, NP for medication management.  Silvano Pacini, [facility]

## 2023-10-16 ENCOUNTER — Ambulatory Visit: Admitting: Internal Medicine

## 2023-10-16 ENCOUNTER — Other Ambulatory Visit: Payer: Self-pay

## 2023-10-16 ENCOUNTER — Encounter: Payer: Self-pay | Admitting: Internal Medicine

## 2023-10-16 ENCOUNTER — Other Ambulatory Visit (HOSPITAL_COMMUNITY): Payer: Self-pay

## 2023-10-16 VITALS — BP 118/78 | HR 57 | Temp 98.2°F | Ht 66.25 in | Wt 159.0 lb

## 2023-10-16 DIAGNOSIS — N390 Urinary tract infection, site not specified: Secondary | ICD-10-CM

## 2023-10-16 DIAGNOSIS — Z87440 Personal history of urinary (tract) infections: Secondary | ICD-10-CM | POA: Diagnosis not present

## 2023-10-16 DIAGNOSIS — N3000 Acute cystitis without hematuria: Secondary | ICD-10-CM

## 2023-10-16 DIAGNOSIS — R300 Dysuria: Secondary | ICD-10-CM

## 2023-10-16 DIAGNOSIS — N952 Postmenopausal atrophic vaginitis: Secondary | ICD-10-CM | POA: Diagnosis not present

## 2023-10-16 LAB — POC URINALSYSI DIPSTICK (AUTOMATED)
Bilirubin, UA: NEGATIVE
Blood, UA: NEGATIVE
Glucose, UA: POSITIVE — AB
Ketones, UA: NEGATIVE
Leukocytes, UA: NEGATIVE
Nitrite, UA: NEGATIVE
Protein, UA: NEGATIVE
Spec Grav, UA: 1.025 (ref 1.010–1.025)
Urobilinogen, UA: 0.2 U/dL
pH, UA: 5.5 (ref 5.0–8.0)

## 2023-10-16 MED ORDER — CEPHALEXIN 500 MG PO CAPS
500.0000 mg | ORAL_CAPSULE | Freq: Two times a day (BID) | ORAL | 0 refills | Status: DC
  Filled 2023-10-16: qty 14, 7d supply, fill #0

## 2023-10-16 NOTE — Patient Instructions (Addendum)
       Medications changes include :   keflex  twice daily for one 1 week      Return if symptoms worsen or fail to improve.

## 2023-10-16 NOTE — Progress Notes (Signed)
 Subjective:    Patient ID: [name], female    DOB: [birth year], 63 y.o.   MRN: [id number]      HPI [name] is here for  Chief Complaint  Patient presents with   Urinary Tract Infection    Burning and pressure that started Wednesday   Discussed the use of AI scribe software for clinical note transcription with the patient, who gave verbal consent to proceed.  History of Present Illness [name] is a 63 year old female who presents with recurrent urinary tract infections.  This is her third UTI in the past three months. Symptoms began on Wednesday with increased urinary frequency, followed by dysuria by Friday. She experiences urinary urgency, frequency, and dysuria, but no hematuria, nausea, abdominal pain, back pain, or fevers.  Her previous UTIs were treated with nitrofurantoin , with complete resolution of symptoms. The first UTI was treated at urgent care, and the second 1 here. Both infections were caused by E. coli, which was pan-sensitive to antibiotics.  She has been on Farxiga  for a few years, which can increase UTI risk, but she did not notice an increase in UTIs until this year. She became sexually active again in June after being widowed last year, which she associates with the onset of her UTIs.  She recently started using estrogen cream three weeks ago to address postmenopausal changes that may contribute to UTIs.     Recent urinary studies 8/15 - ecoli  ua mod leuks, glucose  7/25 - mod blood, small lueks, glucose ecoli    Medications and allergies reviewed with patient and updated if appropriate.  Current Outpatient Medications on File Prior to Visit  Medication Sig Dispense Refill   alendronate  (FOSAMAX ) 70 MG tablet Take 1 tablet (70 mg total) by mouth every 7 (seven) days. Take with a full glass of water on an empty stomach. 12 tablet 3   ALPRAZolam  (XANAX ) 0.5 MG tablet Take 1 tablet (0.5 mg total) by mouth at bedtime as needed for anxiety.  30 tablet 2   ASPIRIN 81 PO Take 81 mg by mouth daily in the afternoon.     atorvastatin  (LIPITOR) 10 MG tablet Take 1 tablet (10 mg total) by mouth daily. 90 tablet 1   buPROPion  (WELLBUTRIN  XL) 300 MG 24 hr tablet Take 1 tablet (300 mg total) by mouth daily. 90 tablet 1   busPIRone  (BUSPAR ) 10 MG tablet Take 1 tablet (10 mg total) by mouth 3 (three) times daily. 90 tablet 2   citalopram  (CELEXA ) 40 MG tablet Take 1 tablet (40 mg total) by mouth daily. 90 tablet 1   Continuous Glucose Receiver (DEXCOM G7 RECEIVER) DEVI Use as directed. 9 each 1   Continuous Glucose Sensor (DEXCOM G7 SENSOR) MISC Attach to skin as directed. Replace every 10 days. 3 each 3   Cyanocobalamin  (VITAMIN B12) 1000 MCG TBCR Take by mouth.     dapagliflozin  propanediol (FARXIGA ) 10 MG TABS tablet Take 1 tablet (10 mg total) by mouth daily before breakfast. 90 tablet 1   dicyclomine  (BENTYL ) 10 MG capsule Take 1 capsule (10 mg total) by mouth 3 (three) times daily as needed. 90 capsule 0   Estradiol  10 MCG TABS vaginal tablet Place 1 tablet (10 mcg total) vaginally 2 (two) times a week. 24 tablet 3   eszopiclone  (LUNESTA ) 2 MG TABS tablet Take 1 tablet (2 mg total) by mouth at bedtime as needed for sleep. Take immediately before bedtime 90 tablet 0  loratadine (CLARITIN) 10 MG tablet      meloxicam  (MOBIC ) 15 MG tablet Take 1 tablet (15 mg total) by mouth daily. (Patient taking differently: Take 15 mg by mouth as needed.) 30 tablet 1   pantoprazole  (PROTONIX ) 20 MG tablet Take 1 tablet (20 mg) by mouth daily. 90 tablet 3   VITAMIN D  PO Take 5,000 Units by mouth.     No current facility-administered medications on file prior to visit.    Review of Systems  Constitutional:  Negative for fever.  Gastrointestinal:  Negative for abdominal pain and nausea.  Genitourinary:  Positive for dysuria, frequency and urgency. Negative for difficulty urinating and hematuria.  Musculoskeletal:  Negative for back pain.        Objective:   Vitals:   10/16/23 1500  BP: 118/78  Pulse: (!) 57  Temp: 98.2 F (36.8 C)  SpO2: 97%   BP Readings from Last 3 Encounters:  10/16/23 118/78  10/06/23 120/78  10/02/23 100/62   Wt Readings from Last 3 Encounters:  10/16/23 159 lb (72.1 kg)  10/06/23 161 lb 9.6 oz (73.3 kg)  10/02/23 163 lb (73.9 kg)   Body mass index is 25.47 kg/m.    Physical Exam Constitutional:      General: She is not in acute distress.    Appearance: Normal appearance. She is not ill-appearing.  HENT:     Head: Normocephalic.  Eyes:     Conjunctiva/sclera: Conjunctivae normal.  Abdominal:     General: There is no distension.     Palpations: Abdomen is soft.     Tenderness: There is no abdominal tenderness. There is no right CVA tenderness, left CVA tenderness, guarding or rebound.  Skin:    General: Skin is warm and dry.  Neurological:     Mental Status: She is alert.            Assessment & Plan:    Assessment and Plan Assessment & Plan Acute cystitis, recurrent urinary tract infections Current symptoms symptoms suggesting infection despite non-impressive urinalysis.  Will go ahead and treat.  Previous E. coli infections treated with nitrofurantoin . Possible incomplete treatment due to timing.  She has been on Farxiga  for a while so that is unlikely to be the cause. Recent sexual activity and postmenopausal status may contribute. - Prescribe Keflex  500 mg, one pill twice a day for 7 days. - Continue estrogen labs for prevention of UTIs and tissue health. - Monitor symptoms and review culture results on Monday. If culture shows no bacteria, consider stopping the antibiotic. - Encourage increased fluid intake.  Postmenopausal atrophic vaginitis Estrogen tabs started three weeks ago for tissue health and UTI prevention. Estrogen therapy is safe and beneficial for reducing discomfort and preventing UTIs. - Continue estrogen tabs indefinitely for maintenance and prevention  of UTIs and discomfort.

## 2023-10-19 ENCOUNTER — Ambulatory Visit: Payer: Self-pay | Admitting: Internal Medicine

## 2023-10-19 LAB — CULTURE, URINE COMPREHENSIVE

## 2023-10-22 ENCOUNTER — Encounter: Payer: Self-pay | Admitting: *Deleted

## 2023-10-23 ENCOUNTER — Other Ambulatory Visit (HOSPITAL_COMMUNITY): Payer: Self-pay

## 2023-10-26 ENCOUNTER — Encounter (HOSPITAL_COMMUNITY): Payer: Self-pay | Admitting: Obstetrics and Gynecology

## 2023-10-26 ENCOUNTER — Ambulatory Visit: Admitting: Psychiatry

## 2023-10-26 DIAGNOSIS — F411 Generalized anxiety disorder: Secondary | ICD-10-CM | POA: Diagnosis not present

## 2023-10-26 NOTE — Progress Notes (Signed)
 [facility] Counselor/Therapist Progress Note  Patient ID: [name], MRN: [id number],    Date: 10/26/2023  Time Spent: 48 minutes start time 2:03 PM end time 2:51 PM  Virtual Visit via Video Note Connected with patient by a telemedicine/telehealth application, with their informed consent, and verified patient privacy and that I am speaking with the correct person using two identifiers. I discussed the limitations, risks, security and privacy concerns of performing psychotherapy and the availability of in person appointments. I also discussed with the patient that there may be a patient responsible charge related to this service. The patient expressed understanding and agreed to proceed. I discussed the treatment planning with the patient. The patient was provided an opportunity to ask questions and all were answered. The patient agreed with the plan and demonstrated an understanding of the instructions. The patient was advised to call  our office if  symptoms worsen or feel they are in a crisis state and need immediate contact.   Therapist Location: home Patient Location: home    Treatment Type: Individual Therapy  Reported Symptoms: sleep issues, crying spell, anxiety, triggered response  Mental Status Exam:  Appearance:   Well Groomed     Behavior:  Appropriate  Motor:  Normal  Speech/Language:   Normal Rate  Affect:  Appropriate and Tearful  Mood:  anxious  Thought process:  normal  Thought content:    WNL  Sensory/Perceptual disturbances:    WNL  Orientation:  oriented to person, place, time/date, and situation  Attention:  Good  Concentration:  Good  Memory:  WNL  Fund of knowledge:   Good  Insight:    Good  Judgment:   Good  Impulse Control:  Good   Risk Assessment: Danger to Self:  No Self-injurious Behavior: No Danger to Others: No Duty to Warn:no Physical Aggression / Violence:No  Access to Firearms a concern: No  Gang Involvement:No   Subjective:  Met with patient via virtual session. She shared she is getting ready to go on a cruise for 2 weeks in [place]. She had a great time with her family in [place] . She shared she had a bad day emotionally yesterday that was triggered by something that her boyfriend had said. She explained that she had not taken her medication and it impacted her sleep. She shared she tried the theta music and did not find it helpful. She went on to share that she has found the breathing and progressive muscle relaxation helpful.  She was encouraged to take her medication as directed and to contact her provider if she had any questions or concerns.  She was also encouraged to make sure she is taking the supplements that her provider had discussed with her.  She went on to share that the comment her boyfriend made stirred up issues with love and money for her.  Had patient explore what she was meaning and what was triggered for her.  Through the processing she was able to see that there is lots of confusion around that issue and she needed to talk with her boyfriend about what she has experienced in previous relationships concerning money and what she would like to continue in their relationship.  Patient was able to see that he has been more caring and empathetic then either one of her previous relationships so she wants things to continue moving in a positive direction but she knows she has to communicate how she feels for that to happen.  Patient was  also encouraged to see what she needs for next session since she will be going out of the country for a few weeks if she needs that time to prepare she needs to cancel the appointment by Friday.  Patient reported finding discussion and treatment helpful and that she felt that she had a plan to communicate with her boyfriend which helped her feel much more relaxed.  Interventions: Solution-Oriented/Positive Psychology and Insight-Oriented  Diagnosis:   ICD-10-CM   1. Generalized  anxiety disorder  F41.1       Plan: Patient is to practice grounding skills to decrease anxiety symptoms.  Patient is to follow plans to communicate with her boyfriend about what got triggered in their conversation.  Patient is to journal and to exercise to release negative emotions appropriately.  Patient is to continue working with provider Regina Mozingo, NP for medication management.   Silvano Pacini, [facility]

## 2023-10-26 NOTE — Progress Notes (Signed)
 Spoke w/ via phone for pre-op interview---[name] needs dos----  BMP, CBG per anesthesia.        Fields results------ A1C 6.4 dated 09/29/23, EKG dated 04/02/23 in Epic. COVID test -----patient states asymptomatic no test needed Arrive at -------1130 NPO after MN NO Solid Food.  Clear liquids from MN until---1030 Pre-Surgery Ensure or G2:  Med rec completed Medications to take morning of surgery -----Wellbutrin , Protonix  and Buspar . Diabetic medication ----- Hold Farxiga  3 days prior to procedure.  GLP1 agonist last dose: GLP1 instructions:  Patient instructed no nail polish to be worn day of surgery Patient instructed to bring photo id and insurance card day of surgery Patient aware to have Driver (ride ) / caregiver    for 24 hours after surgery - Friend [name] Patient Special Instructions ----- Pre-Op special Instructions -----  Patient verbalized understanding of instructions that were given at this phone interview. Patient denies chest pain, sob, fever, cough at the interview.

## 2023-10-29 NOTE — Anesthesia Preprocedure Evaluation (Signed)
 Anesthesia Evaluation  Patient identified by MRN, date of birth, ID band Patient awake    Reviewed: Allergy & Precautions, NPO status , Patient's Chart, lab work & pertinent test results  Airway Mallampati: II  TM Distance: >3 FB Neck ROM: Full    Dental  (+) Teeth Intact, Dental Advisory Given   Pulmonary sleep apnea (no longer uses cpap since weight loss) , former smoker   Pulmonary exam normal breath sounds clear to auscultation       Cardiovascular hypertension (129/67 preop), Normal cardiovascular exam Rhythm:Regular Rate:Normal     Neuro/Psych  PSYCHIATRIC DISORDERS  Depression    negative neurological ROS     GI/Hepatic Neg liver ROS,GERD  Medicated and Controlled,,  Endo/Other  diabetes    Renal/GU negative Renal ROS  negative genitourinary   Musculoskeletal negative musculoskeletal ROS (+)    Abdominal   Peds  Hematology negative hematology ROS (+)   Anesthesia Other Findings   Reproductive/Obstetrics negative OB ROS                              Anesthesia Physical Anesthesia Plan  ASA: 2  Anesthesia Plan: General   Post-op Pain Management: Tylenol  PO (pre-op)* and Toradol  IV (intra-op)*   Induction: Intravenous  PONV Risk Score and Plan: 4 or greater and Dexamethasone , Ondansetron , Midazolam  and Treatment may vary due to age or medical condition  Airway Management Planned: LMA  Additional Equipment: None  Intra-op Plan:   Post-operative Plan: Extubation in OR  Informed Consent: I have reviewed the patients History and Physical, chart, labs and discussed the procedure including the risks, benefits and alternatives for the proposed anesthesia with the patient or authorized representative who has indicated his/her understanding and acceptance.     Dental advisory given  Plan Discussed with: CRNA  Anesthesia Plan Comments:          Anesthesia Quick  Evaluation

## 2023-10-30 ENCOUNTER — Ambulatory Visit (HOSPITAL_COMMUNITY): Payer: Self-pay | Admitting: Anesthesiology

## 2023-10-30 ENCOUNTER — Encounter (HOSPITAL_COMMUNITY): Payer: Self-pay | Admitting: Anesthesiology

## 2023-10-30 ENCOUNTER — Encounter (HOSPITAL_COMMUNITY)
Admission: RE | Disposition: A | Discharge status: Home / Self Care | Payer: Self-pay | Source: Home / Self Care | Attending: Obstetrics and Gynecology

## 2023-10-30 ENCOUNTER — Encounter (HOSPITAL_COMMUNITY): Payer: Self-pay | Admitting: Obstetrics and Gynecology

## 2023-10-30 ENCOUNTER — Other Ambulatory Visit: Payer: Self-pay

## 2023-10-30 ENCOUNTER — Ambulatory Visit (HOSPITAL_COMMUNITY)
Admission: RE | Admit: 2023-10-30 | Discharge: 2023-10-30 | Disposition: A | Discharge status: Home / Self Care | Attending: Obstetrics and Gynecology | Admitting: Obstetrics and Gynecology

## 2023-10-30 DIAGNOSIS — E119 Type 2 diabetes mellitus without complications: Secondary | ICD-10-CM | POA: Insufficient documentation

## 2023-10-30 DIAGNOSIS — Z87891 Personal history of nicotine dependence: Secondary | ICD-10-CM | POA: Insufficient documentation

## 2023-10-30 DIAGNOSIS — R87611 Atypical squamous cells cannot exclude high grade squamous intraepithelial lesion on cytologic smear of cervix (ASC-H): Secondary | ICD-10-CM | POA: Diagnosis present

## 2023-10-30 DIAGNOSIS — G4730 Sleep apnea, unspecified: Secondary | ICD-10-CM | POA: Insufficient documentation

## 2023-10-30 DIAGNOSIS — I10 Essential (primary) hypertension: Secondary | ICD-10-CM | POA: Diagnosis not present

## 2023-10-30 DIAGNOSIS — N870 Mild cervical dysplasia: Secondary | ICD-10-CM | POA: Insufficient documentation

## 2023-10-30 DIAGNOSIS — N72 Inflammatory disease of cervix uteri: Secondary | ICD-10-CM | POA: Diagnosis not present

## 2023-10-30 DIAGNOSIS — E1169 Type 2 diabetes mellitus with other specified complication: Secondary | ICD-10-CM

## 2023-10-30 DIAGNOSIS — K219 Gastro-esophageal reflux disease without esophagitis: Secondary | ICD-10-CM | POA: Diagnosis not present

## 2023-10-30 HISTORY — PX: LEEP: SHX91

## 2023-10-30 LAB — BASIC METABOLIC PANEL WITH GFR
Anion gap: 14 (ref 5–15)
BUN: 14 mg/dL (ref 8–23)
CO2: 23 mmol/L (ref 22–32)
Calcium: 8.9 mg/dL (ref 8.9–10.3)
Chloride: 99 mmol/L (ref 98–111)
Creatinine, Ser: 0.72 mg/dL (ref 0.44–1.00)
GFR, Estimated: >60 mL/min (ref >60)
Glucose, Bld: 119 mg/dL — ABNORMAL HIGH (ref 70–99)
Potassium: 5 mmol/L (ref 3.5–5.1)
Sodium: 136 mmol/L (ref 135–145)

## 2023-10-30 LAB — GLUCOSE, CAPILLARY
Glucose-Capillary: 120 mg/dL — ABNORMAL HIGH (ref 70–99)
Glucose-Capillary: 117 mg/dL — ABNORMAL HIGH (ref 70–99)

## 2023-10-30 SURGERY — LEEP (LOOP ELECTROSURGICAL EXCISION PROCEDURE)
Anesthesia: General | Site: Cervix

## 2023-10-30 MED ORDER — ONDANSETRON HCL 4 MG/2ML IJ SOLN: 4.0000 mg | Freq: Once | INTRAMUSCULAR | Status: DC | PRN

## 2023-10-30 MED ORDER — AMISULPRIDE (ANTIEMETIC) 5 MG/2ML IV SOLN: 10.0000 mg | Freq: Once | INTRAVENOUS | Status: DC | PRN

## 2023-10-30 MED ORDER — MEPERIDINE HCL 25 MG/ML IJ SOLN: 6.2500 mg | INTRAMUSCULAR | Status: DC | PRN

## 2023-10-30 MED ORDER — DEXAMETHASONE SOD PHOSPHATE PF 10 MG/ML IJ SOLN
INTRAMUSCULAR | Status: DC | PRN
  Administered 2023-10-30: 8 mg via INTRAVENOUS

## 2023-10-30 MED ORDER — LIDOCAINE-EPINEPHRINE 1 %-1:100000 IJ SOLN
INTRAMUSCULAR | Status: AC
  Filled 2023-10-30: qty 1

## 2023-10-30 MED ORDER — MIDAZOLAM HCL 2 MG/2ML IJ SOLN
INTRAMUSCULAR | Status: AC
Start: 2023-10-30 — End: 2023-10-30
  Filled 2023-10-30: qty 2

## 2023-10-30 MED ORDER — 0.9 % SODIUM CHLORIDE (POUR BTL) OPTIME
TOPICAL | Status: DC | PRN
  Administered 2023-10-30: 1000 mL

## 2023-10-30 MED ORDER — CHLORHEXIDINE GLUCONATE 0.12 % MT SOLN
15.0000 mL | Freq: Once | OROMUCOSAL | Status: AC
  Administered 2023-10-30: 15 mL via OROMUCOSAL

## 2023-10-30 MED ORDER — OXYCODONE HCL 5 MG/5ML PO SOLN: 5.0000 mg | Freq: Once | ORAL | Status: DC | PRN

## 2023-10-30 MED ORDER — ONDANSETRON HCL 4 MG/2ML IJ SOLN
INTRAMUSCULAR | Status: AC
Start: 2023-10-30 — End: 2023-10-30
  Filled 2023-10-30: qty 2

## 2023-10-30 MED ORDER — IODINE STRONG (LUGOLS) 5 % PO SOLN
ORAL | Status: DC | PRN
Start: 2023-10-30 — End: 2023-10-30
  Administered 2023-10-30: .1 mL

## 2023-10-30 MED ORDER — OXYCODONE HCL 5 MG PO TABS: 5.0000 mg | ORAL_TABLET | Freq: Once | ORAL | Status: DC | PRN

## 2023-10-30 MED ORDER — FENTANYL CITRATE (PF) 250 MCG/5ML IJ SOLN
INTRAMUSCULAR | Status: DC | PRN
  Administered 2023-10-30: 75 ug via INTRAVENOUS
  Administered 2023-10-30: 25 ug via INTRAVENOUS

## 2023-10-30 MED ORDER — LIDOCAINE 2% (20 MG/ML) 5 ML SYRINGE
INTRAMUSCULAR | Status: AC
  Filled 2023-10-30: qty 5

## 2023-10-30 MED ORDER — MONSELS FERRIC SUBSULFATE EX SOLN
CUTANEOUS | Status: DC | PRN
  Administered 2023-10-30: 1 via TOPICAL

## 2023-10-30 MED ORDER — FENTANYL CITRATE (PF) 250 MCG/5ML IJ SOLN
INTRAMUSCULAR | Status: AC
  Filled 2023-10-30: qty 5

## 2023-10-30 MED ORDER — PROPOFOL 10 MG/ML IV BOLUS
INTRAVENOUS | Status: AC
  Filled 2023-10-30: qty 20

## 2023-10-30 MED ORDER — LACTATED RINGERS IV SOLN
INTRAVENOUS | Status: DC
Start: 2023-10-30 — End: 2023-10-30

## 2023-10-30 MED ORDER — ONDANSETRON HCL 4 MG/2ML IJ SOLN
INTRAMUSCULAR | Status: DC | PRN
  Administered 2023-10-30: 4 mg via INTRAVENOUS

## 2023-10-30 MED ORDER — LIDOCAINE-EPINEPHRINE (PF) 1 %-1:200000 IJ SOLN
INTRAMUSCULAR | Status: DC | PRN
  Administered 2023-10-30: 10 mL

## 2023-10-30 MED ORDER — ACETIC ACID 5 % SOLN
Status: AC
  Filled 2023-10-30: qty 12

## 2023-10-30 MED ORDER — ACETAMINOPHEN 500 MG PO TABS
ORAL_TABLET | ORAL | Status: AC
  Filled 2023-10-30: qty 2

## 2023-10-30 MED ORDER — PROPOFOL 1000 MG/100ML IV EMUL
INTRAVENOUS | Status: AC
  Filled 2023-10-30: qty 100

## 2023-10-30 MED ORDER — LIDOCAINE-EPINEPHRINE (PF) 1 %-1:200000 IJ SOLN
INTRAMUSCULAR | Status: AC
  Filled 2023-10-30: qty 30

## 2023-10-30 MED ORDER — EPHEDRINE SULFATE-NACL 50-0.9 MG/10ML-% IV SOSY
PREFILLED_SYRINGE | INTRAVENOUS | Status: DC | PRN
  Administered 2023-10-30: 10 mg via INTRAVENOUS

## 2023-10-30 MED ORDER — LACTATED RINGERS IV SOLN: INTRAVENOUS | Status: DC

## 2023-10-30 MED ORDER — LIDOCAINE 2% (20 MG/ML) 5 ML SYRINGE
INTRAMUSCULAR | Status: DC | PRN
  Administered 2023-10-30: 60 mg via INTRAVENOUS

## 2023-10-30 MED ORDER — ACETAMINOPHEN 500 MG PO TABS
1000.0000 mg | ORAL_TABLET | ORAL | Status: AC
  Administered 2023-10-30: 1000 mg via ORAL

## 2023-10-30 MED ORDER — MONSELS FERRIC SUBSULFATE EX SOLN
CUTANEOUS | Status: AC
  Filled 2023-10-30: qty 8

## 2023-10-30 MED ORDER — IODINE STRONG (LUGOLS) 5 % PO SOLN
ORAL | Status: AC
  Filled 2023-10-30: qty 1

## 2023-10-30 MED ORDER — GLYCOPYRROLATE PF 0.2 MG/ML IJ SOSY
PREFILLED_SYRINGE | INTRAMUSCULAR | Status: DC | PRN
  Administered 2023-10-30: .2 mg via INTRAVENOUS

## 2023-10-30 MED ORDER — PROPOFOL 10 MG/ML IV BOLUS
INTRAVENOUS | Status: DC | PRN
  Administered 2023-10-30: 50 mg via INTRAVENOUS
  Administered 2023-10-30: 125 ug/kg/min via INTRAVENOUS
  Administered 2023-10-30: 150 mg via INTRAVENOUS

## 2023-10-30 MED ORDER — MIDAZOLAM HCL 2 MG/2ML IJ SOLN
INTRAMUSCULAR | Status: DC | PRN
  Administered 2023-10-30: 2 mg via INTRAVENOUS

## 2023-10-30 MED ORDER — PHENYLEPHRINE 80 MCG/ML (10ML) SYRINGE FOR IV PUSH (FOR BLOOD PRESSURE SUPPORT)
PREFILLED_SYRINGE | INTRAVENOUS | Status: DC | PRN
  Administered 2023-10-30: 160 ug via INTRAVENOUS

## 2023-10-30 MED ORDER — HYDROMORPHONE HCL 1 MG/ML IJ SOLN: 0.2500 mg | INTRAMUSCULAR | Status: DC | PRN

## 2023-10-30 MED ORDER — ORAL CARE MOUTH RINSE: 15.0000 mL | Freq: Once | OROMUCOSAL | Status: AC

## 2023-10-30 MED ORDER — PHENYLEPHRINE 80 MCG/ML (10ML) SYRINGE FOR IV PUSH (FOR BLOOD PRESSURE SUPPORT)
PREFILLED_SYRINGE | INTRAVENOUS | Status: AC
  Filled 2023-10-30: qty 10

## 2023-10-30 MED ORDER — ACETIC ACID 4% SOLUTION
Status: DC | PRN
  Administered 2023-10-30: 1 via TOPICAL

## 2023-10-30 MED ORDER — CHLORHEXIDINE GLUCONATE 0.12 % MT SOLN
OROMUCOSAL | Status: AC
  Filled 2023-10-30: qty 15

## 2023-10-30 MED ORDER — KETOROLAC TROMETHAMINE 30 MG/ML IJ SOLN
30.0000 mg | Freq: Once | INTRAMUSCULAR | Status: AC | PRN
  Administered 2023-10-30: 30 mg via INTRAVENOUS

## 2023-10-30 MED ORDER — EPHEDRINE 5 MG/ML INJ
INTRAVENOUS | Status: AC
  Filled 2023-10-30: qty 5

## 2023-10-30 SURGICAL SUPPLY — 26 items
APPLICATOR COTTON TIP 6 STRL (MISCELLANEOUS) IMPLANT
CATH ROBINSON RED A/P 16FR (CATHETERS) IMPLANT
DILATOR CANAL MILEX (MISCELLANEOUS) IMPLANT
ELECT BALL LEEP 3MM BLK (ELECTRODE) IMPLANT
ELECT BALL LEEP 5MM RED (ELECTRODE) IMPLANT
ELECTRODE LOOP LP RND 10X10YLW (CUTTING LOOP) IMPLANT
ELECTRODE LOOP LP RND 15X12GRN (CUTTING LOOP) IMPLANT
ELECTRODE LOOP LP RND 20X12WHT (CUTTING LOOP) IMPLANT
ELECTRODE LOOP LP SQR 10X10ORG (CUTTING LOOP) IMPLANT
EXTENDER ELECT LOOP LEEP 10CM (CUTTING LOOP) IMPLANT
GLOVE BIO SURGEON STRL SZ7 (GLOVE) ×1 IMPLANT
GLOVE BIOGEL PI IND STRL 7.0 (GLOVE) ×1 IMPLANT
GLOVE BIOGEL PI IND STRL 7.5 (GLOVE) IMPLANT
GLOVE SURG SS PI 7.0 STRL IVOR (GLOVE) IMPLANT
GOWN STRL REUS W/ TWL XL LVL3 (GOWN DISPOSABLE) ×1 IMPLANT
HEMOSTAT SURGICEL 4X8 (HEMOSTASIS) IMPLANT
HIBICLENS CHG 4% 4OZ BTL (MISCELLANEOUS) ×1 IMPLANT
PACK VAGINAL WOMENS (CUSTOM PROCEDURE TRAY) ×1 IMPLANT
PAD OB MATERNITY 11 LF (PERSONAL CARE ITEMS) ×1 IMPLANT
SCOPETTES 8 STERILE (MISCELLANEOUS) ×1 IMPLANT
SOLN 0.9% NACL 1000 ML (IV SOLUTION) ×1 IMPLANT
SOLN 0.9% NACL POUR BTL 1000ML (IV SOLUTION) ×1 IMPLANT
SPIKE FLUID TRANSFER (MISCELLANEOUS) IMPLANT
SUT SILK 0 SH 30 (SUTURE) ×1 IMPLANT
TOWEL GREEN STERILE (TOWEL DISPOSABLE) ×1 IMPLANT
TUBE CONNECTING 20X1/4 (TUBING) ×1 IMPLANT

## 2023-10-30 NOTE — Op Note (Signed)
   10/30/23 [id number] [name]  OPERATIVE REPORT  Preop Diagnosis: Papanicolaou smear of cervix with atypical squamous cells cannot exclude high grade squamous intraepithelial lesion  Post op Diagnosis: Same Procedure: LEEP, Endocervical curettage   Surgeon: Vera LULLA Pa, MD  Assistant: Circulator: Raguel Sherra LITTIE, RN Relief Circulator: [place], Hadassah Buel RAMAN, RN Scrub Person: Key, Jackolyn FERNS Circulator Assistant: Neysa Search C   Fluids: please see anesthesia report   Complications: None Anesthesia: MAC    Findings: Normal appearing atrophic cervix. Transformation zone not visualized. Endocervical stenosis.   Estimated blood loss: Minimal <15cc   Specimens: ID Type Source Tests Collected by Time Destination  1 : upper cervix, stitched at 12 o'clock Tissue PATH Gyn biopsy SURGICAL PATHOLOGY Pa Vera LULLA, MD 10/30/2023 1200   2 : lower cervix, stitched at 6 o'clock Tissue PATH Gyn biopsy SURGICAL PATHOLOGY Pa Vera LULLA, MD 10/30/2023 1202   3 : endocervical LEEP Tissue PATH Gyn biopsy SURGICAL PATHOLOGY Pa Vera LULLA, MD 10/30/2023 1205   4 : endocervical curettings Tissue PATH Gyn biopsy SURGICAL PATHOLOGY Pa Vera LULLA, MD 10/30/2023 1210      Disposition of specimen: Pathology     PROCEDURE IN DETAIL:  The patient had sequential compression devices applied to her lower extremities while in the preoperative area.  She was then taken to the operating room where anesthesia was induced.  She was placed in the dorsal lithotomy position, and was prepped and draped in a sterile manner.  After an adequate timeout was performed, a straight catheterization was performed.  The bivalved coated speculum was placed in the patient's vagina. A grounding pad placed on the patient. Acetic acid solution was applied to the cervix and findings were noted above.  Local anesthesia was administered via an intracervical block using 10 ml of 1% Lidocaine  with epinephrine.  The suction was turned on, and the loop wire on 45 Watts of blended current was used to excise the endocervical portion of the cervix. Given anatomy and atrophy, the specimen was taken in 2 parts- anterior (15x79mm) and posterior. An endocervical LEEP was performed, followed by an ECC. Specimens were handed off and sent to pathology.   Roller ball electrocautery was performed around the margins and along the surgical bed. Excellent hemostasis was achieved using Monsel's solution. The speculum was removed from the vagina. Sponge, laps, instrument count were correct x2.  Patient was taken to recover in stable condition.   The patient will be discharged to home as per PACU criteria.  Routine postoperative instructions given.    Vera LULLA Pa, MD Obstetrician & Gynecologist [facility], [facility]

## 2023-10-30 NOTE — Transfer of Care (Signed)
 Immediate Anesthesia Transfer of Care Note  Patient: [name]  Procedure(s) Performed: LEEP (LOOP ELECTROSURGICAL EXCISION PROCEDURE) (Cervix)  Patient Location: PACU  Anesthesia Type:General  Level of Consciousness: awake, alert , oriented, and patient cooperative  Airway & Oxygen Therapy: Patient Spontanous Breathing and Patient connected to face mask oxygen  Post-op Assessment: Report given to RN, Post -op Vital signs reviewed and stable, Patient moving all extremities, and Patient moving all extremities X 4  Post vital signs: Reviewed and stable  Last Vitals:  Vitals Value Taken Time  BP 129/64 10/30/23 12:34  Temp 36.8 C 10/30/23 12:34  Pulse 69 10/30/23 12:34  Resp 15 10/30/23 12:34  SpO2 100 % 10/30/23 12:34    Last Pain:  Vitals:   10/30/23 1102  TempSrc: Oral  PainSc: 0-No pain      Patients Stated Pain Goal: 3 (10/30/23 1102)  Complications: No notable events documented.

## 2023-10-30 NOTE — Anesthesia Procedure Notes (Signed)
 Procedure Name: LMA Insertion Date/Time: 10/30/2023 11:42 AM  Performed by: Nada Corean CROME, CRNAPre-anesthesia Checklist: Patient identified, Emergency Drugs available, Suction available, Patient being monitored and Timeout performed Patient Re-evaluated:Patient Re-evaluated prior to induction Oxygen Delivery Method: Circle system utilized Preoxygenation: Pre-oxygenation with 100% oxygen Induction Type: IV induction Ventilation: Mask ventilation without difficulty LMA: LMA inserted LMA Size: 4.0 Tube type: Oral Number of attempts: 1 Placement Confirmation: positive ETCO2 and breath sounds checked- equal and bilateral Tube secured with: Tape Dental Injury: Teeth and Oropharynx as per pre-operative assessment

## 2023-10-30 NOTE — Discharge Instructions (Signed)

## 2023-10-30 NOTE — Progress Notes (Signed)
 Called pt regarding early arrival per MD. Pt agrees to come in at 35.

## 2023-10-30 NOTE — Interval H&P Note (Signed)
 Date of Initial H&P: 10/02/2023  History reviewed, patient examined, no change in status, stable for surgery.

## 2023-10-30 NOTE — Addendum Note (Signed)
 Addendum  created 10/30/23 1318 by Nada Corean CROME, CRNA   Intraprocedure Meds edited

## 2023-10-30 NOTE — Discharge Instr - Supplementary Instructions (Signed)
 May take Tylenol  after 5pm if needed for discomfort.  May take Ibuprofen ,Advil ,Alleve after 6:15pm if needed for discomfort.

## 2023-10-30 NOTE — Anesthesia Postprocedure Evaluation (Signed)
 Anesthesia Post Note  Patient: [name]  Procedure(s) Performed: LEEP (LOOP ELECTROSURGICAL EXCISION PROCEDURE) (Cervix)     Patient location during evaluation: PACU Anesthesia Type: General Level of consciousness: awake and alert Pain management: pain level controlled Vital Signs Assessment: post-procedure vital signs reviewed and stable Respiratory status: spontaneous breathing, nonlabored ventilation, respiratory function stable and patient connected to nasal cannula oxygen Cardiovascular status: blood pressure returned to baseline and stable Postop Assessment: no apparent nausea or vomiting Anesthetic complications: no   No notable events documented.  Last Vitals:  Vitals:   10/30/23 1234 10/30/23 1302  BP: 129/64 128/74  Pulse: 69 77  Resp: 15 11  Temp: 36.8 C   SpO2: 100% 100%    Last Pain:  Vitals:   10/30/23 1102  TempSrc: Oral  PainSc: 0-No pain                 Rome Ade

## 2023-10-31 ENCOUNTER — Encounter (HOSPITAL_COMMUNITY): Payer: Self-pay | Admitting: Obstetrics and Gynecology

## 2023-11-02 ENCOUNTER — Ambulatory Visit: Admitting: Psychiatry

## 2023-11-03 ENCOUNTER — Ambulatory Visit: Payer: Self-pay | Admitting: Obstetrics and Gynecology

## 2023-11-03 LAB — SURGICAL PATHOLOGY

## 2023-11-05 ENCOUNTER — Encounter: Payer: Self-pay | Admitting: Obstetrics and Gynecology

## 2023-11-05 ENCOUNTER — Other Ambulatory Visit (HOSPITAL_COMMUNITY): Payer: Self-pay

## 2023-11-05 ENCOUNTER — Ambulatory Visit (INDEPENDENT_AMBULATORY_CARE_PROVIDER_SITE_OTHER): Admitting: Obstetrics and Gynecology

## 2023-11-05 VITALS — BP 122/74 | HR 66 | Temp 98.2°F | Wt 158.0 lb

## 2023-11-05 DIAGNOSIS — B3731 Acute candidiasis of vulva and vagina: Secondary | ICD-10-CM

## 2023-11-05 DIAGNOSIS — Z09 Encounter for follow-up examination after completed treatment for conditions other than malignant neoplasm: Secondary | ICD-10-CM

## 2023-11-05 DIAGNOSIS — N870 Mild cervical dysplasia: Secondary | ICD-10-CM | POA: Insufficient documentation

## 2023-11-05 LAB — WET PREP FOR TRICH, YEAST, CLUE

## 2023-11-05 MED ORDER — CLOTRIMAZOLE-BETAMETHASONE 1-0.05 % EX CREA
1.0000 | TOPICAL_CREAM | Freq: Two times a day (BID) | CUTANEOUS | 0 refills | Status: AC
  Filled 2023-11-05: qty 30, 15d supply, fill #0

## 2023-11-05 MED ORDER — FLUCONAZOLE 150 MG PO TABS
150.0000 mg | ORAL_TABLET | Freq: Once | ORAL | 0 refills | Status: AC
  Filled 2023-11-05: qty 3, 9d supply, fill #0

## 2023-11-05 NOTE — Progress Notes (Signed)
 63 y.o. [id number] postmenopausal female with CIN 1 (2025, +margin), hx of LEEP (1990), hx of cosmetic labioplasty (2022), osteoporosis (managed by endocrine) here for postop exam: 1 week status post LEEP for ASC-H, HPV neg. Widowed, now dating. GYN at [facility] left. Last seen last year with abnormal PAP.  She is still having spotting off and on, vaginal itching. No pelvic pain. Normal diet, voiding, and Bms.  Sexually active: Yes H/o abnl PAP: yes Last PAP: 01/30/20 ASC-H, HPV neg > colpo ECC in OR - Focal koilocytic atypia (consistent with HPV effect) 05/27/21 ASCUS, +HRHPV April 2022 ECC with HPV effect No pathology/cytology sent     Component Value Date/Time   DIAGPAP (A) 09/10/2023 1009    - Atypical squamous cells, cannot exclude high grade squamous   DIAGPAP intraepithelial lesion (ASC-H) (A) 09/10/2023 1009   HPVHIGH Negative 09/10/2023 1009   ADEQPAP  09/10/2023 1009    Satisfactory for evaluation; transformation zone component PRESENT.   10/30/23 path: A. CERVIX, LEEP:       Low-grade squamous intraepithelial lesion (LSIL / CIN1).       LSIL extends to endocervical margin.       No evidence of high-grade dysplasia.   B. CERVIX, LEEP (lower LEEP specimen):       Low-grade squamous intraepithelial lesion (LSIL / CIN1).       LSIL extends to endocervical and ectocervical margins.       No evidence of high-grade dysplasia.   C. ENDOCERVIX, LEEP:       Endocervical mucosa with chronic inflammation.       Negative for squamous intraepithelial lesion.       See comment.   D. ENDOCERVIX, CURETTAGE:       Insufficient for evaluation.   GYN HISTORY: Prior LEEP Abnormal PAP 2022  OB History  Gravida Para Term Preterm AB Living  8    8   SAB IAB Ectopic Multiple Live Births  8        # Outcome Date GA Lbr Len/2nd Weight Sex Type Anes PTL Lv  8 SAB           7 SAB           6 SAB           5 SAB           4 SAB           3 SAB           2 SAB           1 SAB             Past Medical History:  Diagnosis Date   Depression    Diabetes mellitus without complication (HCC)    GERD (gastroesophageal reflux disease)    Hypertension    Sleep apnea    Has not worn CPAP in many years   Past Surgical History:  Procedure Laterality Date   COLONOSCOPY  2013   hypo plastic polyps at [facility] in [place]   COLONOSCOPY WITH ESOPHAGOGASTRODUODENOSCOPY (EGD)  11/11/2021   DIAGNOSTIC LAPAROSCOPY     DILATION AND CURETTAGE OF UTERUS N/A 05/09/2020   Procedure: CERVICAL DILATATION AND ENDOCERVICAL CURETTAGE;  Surgeon: Storm Setter, DO;  Location: [facility];  Service: Gynecology;  Laterality: N/A;   LABIOPLASTY Left 08/10/2017   Procedure: LABIAPLASTY REVISION;  Surgeon: Timmie Norris, MD;  Location: [facility];  Service: Gynecology;  Laterality: Left;  Labiaplasty revision  LEEP N/A 10/30/2023   Procedure: LEEP (LOOP ELECTROSURGICAL EXCISION PROCEDURE);  Surgeon: Dallie Vera GAILS, MD;  Location: [facility] OR;  Service: Gynecology;  Laterality: N/A;   UPPER GASTROINTESTINAL ENDOSCOPY     WRIST SURGERY  11/2022   Current Outpatient Medications on File Prior to Visit  Medication Sig Dispense Refill   alendronate  (FOSAMAX ) 70 MG tablet Take 1 tablet (70 mg total) by mouth every 7 (seven) days. Take with a full glass of water on an empty stomach. (Patient taking differently: Take 70 mg by mouth every 7 (seven) days. Take with a full glass of water on an empty stomach.  HAS NOT STARTED) 12 tablet 3   ALPRAZolam  (XANAX ) 0.5 MG tablet Take 1 tablet (0.5 mg total) by mouth at bedtime as needed for anxiety. 30 tablet 2   ASPIRIN 81 PO Take 81 mg by mouth daily in the afternoon.     atorvastatin  (LIPITOR) 10 MG tablet Take 1 tablet (10 mg total) by mouth daily. 90 tablet 1   buPROPion  (WELLBUTRIN  XL) 300 MG 24 hr tablet Take 1 tablet (300 mg total) by mouth daily. 90 tablet 1   busPIRone  (BUSPAR ) 10 MG tablet Take 1 tablet (10 mg total) by  mouth 3 (three) times daily. 90 tablet 2   citalopram  (CELEXA ) 40 MG tablet Take 1 tablet (40 mg total) by mouth daily. 90 tablet 1   Continuous Glucose Receiver (DEXCOM G7 RECEIVER) DEVI Use as directed. 9 each 1   Continuous Glucose Sensor (DEXCOM G7 SENSOR) MISC Attach to skin as directed. Replace every 10 days. 3 each 3   Cyanocobalamin  (VITAMIN B12) 1000 MCG TBCR Take by mouth.     dapagliflozin  propanediol (FARXIGA ) 10 MG TABS tablet Take 1 tablet (10 mg total) by mouth daily before breakfast. 90 tablet 1   dicyclomine  (BENTYL ) 10 MG capsule Take 1 capsule (10 mg total) by mouth 3 (three) times daily as needed. 90 capsule 0   [Paused] Estradiol  10 MCG TABS vaginal tablet Place 1 tablet (10 mcg total) vaginally 2 (two) times a week. 24 tablet 3   eszopiclone  (LUNESTA ) 2 MG TABS tablet Take 1 tablet (2 mg total) by mouth at bedtime as needed for sleep. Take immediately before bedtime 90 tablet 0   loratadine (CLARITIN) 10 MG tablet      meloxicam  (MOBIC ) 15 MG tablet Take 1 tablet (15 mg total) by mouth daily. (Patient taking differently: Take 15 mg by mouth as needed.) 30 tablet 1   pantoprazole  (PROTONIX ) 20 MG tablet Take 1 tablet (20 mg) by mouth daily. 90 tablet 3   UNABLE TO FIND Med Name: Magnesium Sleeplex by [employer]     VITAMIN D  PO Take 5,000 Units by mouth.     No current facility-administered medications on file prior to visit.   Allergies  Allergen Reactions   Flagyl [Metronidazole] Diarrhea   Metformin And Related Shortness Of Breath     PE Today's Vitals   11/05/23 0934  BP: 122/74  Pulse: 66  Temp: 98.2 F (36.8 C)  TempSrc: Oral  SpO2: 98%  Weight: 158 lb (71.7 kg)    Body mass index is 25.31 kg/m.  Physical Exam Vitals reviewed. Exam conducted with a chaperone present.  Constitutional:      General: She is not in acute distress.    Appearance: Normal appearance.  HENT:     Head: Normocephalic and atraumatic.     Nose: Nose normal.  Eyes:      Extraocular  Movements: Extraocular movements intact.     Conjunctiva/sclera: Conjunctivae normal.  Pulmonary:     Effort: Pulmonary effort is normal.  Genitourinary:    Exam position: Lithotomy position.     Vagina: Vaginal discharge present.     Cervix: Normal. No cervical motion tenderness, discharge or lesion.     Uterus: Normal. Not enlarged and not tender.      Adnexa: Right adnexa normal and left adnexa normal.     Comments: +vulvar erythema Leep bed healing well Musculoskeletal:        General: Normal range of motion.     Cervical back: Normal range of motion.  Neurological:     General: No focal deficit present.     Mental Status: She is alert.  Psychiatric:        Mood and Affect: Mood normal.        Behavior: Behavior normal.      Assessment and Plan:        Yeast vaginitis -     WET PREP FOR TRICH, YEAST, CLUE -     Fluconazole; Take 1 tablet (150 mg total) by mouth once for 1 dose. Repeat dose every 3 days if symptoms are still present. Up to 3 doses.  Dispense: 3 tablet; Refill: 0 -     Clotrimazole-Betamethasone; Apply topically 2 (two) times daily for 14 days. To vulva and perineum  Dispense: 30 g; Refill: 0 Wet prep neg however high suspicion for yeas vaginitis. Ddx also includes contact dermatitis Will treat for yeast with topical steroid as well  Postop check CIN1 with + margins Normal postop exam.  Pathology reviewed with patient. Cleared to return to work in 1 wk Continue pelvic rest PAP in 6 months All questions answered.     Vera LULLA Pa, MD

## 2023-11-05 NOTE — Patient Instructions (Signed)
 Continue pelvic rest for 2 weeks or longer if you are still spotting or cramping. This includes use of tampons, intercourse, douching, public bathing (including hot tubs, pools, the ocean).

## 2023-11-10 ENCOUNTER — Telehealth: Payer: Self-pay

## 2023-11-10 NOTE — Telephone Encounter (Signed)
 Patient notified & given bleeding precautions.

## 2023-11-10 NOTE — Telephone Encounter (Signed)
 Left message to call back.

## 2023-11-10 NOTE — Telephone Encounter (Signed)
 Patient called triage line. She states she had a LEEP post op on 10-26-23. She is now in [place] & this morning she felt a gush. She went to the bathroom and she had saturated her pad with blood and there was a clot. There was blood also in the toilet. She went back about 2hrs later and noticed a smaller amount of blood. Patient states she is not having any bleeding right now. She wants to know if this is normal. She has not had any pelvic pain or fever. Please advise.

## 2023-11-12 ENCOUNTER — Ambulatory Visit: Admitting: Psychiatry

## 2023-11-19 ENCOUNTER — Ambulatory Visit: Admitting: Psychiatry

## 2023-11-30 ENCOUNTER — Ambulatory Visit: Admitting: Psychiatry

## 2023-11-30 DIAGNOSIS — F411 Generalized anxiety disorder: Secondary | ICD-10-CM | POA: Diagnosis not present

## 2023-11-30 NOTE — Progress Notes (Signed)
 [facility] Counselor/Therapist Progress Note  Patient ID: [name], MRN: [id number],    Date: 11/30/2023  Time Spent: 48 minutes start time 11:02 AM end time 11:50 AM Virtual Visit via Video Note Connected with patient by a telemedicine/telehealth application, with their informed consent, and verified patient privacy and that I am speaking with the correct person using two identifiers. I discussed the limitations, risks, security and privacy concerns of performing psychotherapy and the availability of in person appointments. I also discussed with the patient that there may be a patient responsible charge related to this service. The patient expressed understanding and agreed to proceed. I discussed the treatment planning with the patient. The patient was provided an opportunity to ask questions and all were answered. The patient agreed with the plan and demonstrated an understanding of the instructions. The patient was advised to call  our office if  symptoms worsen or feel they are in a crisis state and need immediate contact.   Therapist Location: home Patient Location: home    Treatment Type: Individual Therapy  Reported Symptoms: sleep issues, anxiety, overwhelmed, grief issues, fatigue  Mental Status Exam:  Appearance:   Well Groomed     Behavior:  Appropriate  Motor:  Normal  Speech/Language:   Normal Rate  Affect:  Appropriate  Mood:  anxious  Thought process:  normal  Thought content:    WNL  Sensory/Perceptual disturbances:    WNL  Orientation:  oriented to person, place, time/date, and situation  Attention:  Good  Concentration:  Good  Memory:  WNL  Fund of knowledge:   Good  Insight:    Good  Judgment:   Good  Impulse Control:  Good   Risk Assessment: Danger to Self:  No Self-injurious Behavior: No Danger to Others: No Duty to Warn:no Physical Aggression / Violence:No  Access to Firearms a concern: No  Gang Involvement:No   Subjective: Met with  patient via virtual session. She shared that the trip was wonderful and she got along well with Koren. She noticed on the trip she didn't wake  up with the electrical stuff. She went on to share that she still hasn't had that since she came back home. She shared that she is feeling being able to be away from it all for that long has helped her to be more at peace with everything. She and Koren are more of a couple now which is a good thing as well. She is also seeing that her marriage was hard on her and it was not the life she wanted especially with all the hoarding that her husband did. She went on to share she still isn't sleeping well since she returned. Discussed how the stuff in her house is still a huge weight on her and may be some of the issue with her sleep.  Discussed a different plan to help her break down the task of going through the stuff in her house in a reasonable manner.  Encouraged her to look at stuff on line that the [employer] lady decluttering website has.  In the meantime she is to find 25 things to give away and 25 things to throw away each day.  She shared she felt that that plan was doable and more realistic than her trying to work on her home 4 hours every day.  Discussed the importance of setting up realistic expectations or she would not follow them.  Patient stated she liked the plan and wanted to  work on that.  Also encouraged her to have a journal to write to her husband the things that surface and bother her if things do over the next week.  Interventions: Solution-Oriented/Positive Psychology and Insight-Oriented  Diagnosis:   ICD-10-CM   1. Generalized anxiety disorder  F41.1       Plan: Patient is to practice grounding skills to decrease anxiety symptoms.  Patient is to follow plans to find 25 things to take to [facility] and 25 things to throw away each day. Patient is to write to her husband things that surface that she is hurt by  Patient is to journal and to exercise to  release negative emotions appropriately.  Patient is to continue working with provider Regina Mozingo, NP for medication management.   Silvano Pacini, [facility]

## 2023-12-01 ENCOUNTER — Ambulatory Visit: Payer: Self-pay | Admitting: Obstetrics and Gynecology

## 2023-12-01 ENCOUNTER — Ambulatory Visit: Admitting: Obstetrics and Gynecology

## 2023-12-01 ENCOUNTER — Ambulatory Visit: Payer: Self-pay

## 2023-12-01 VITALS — BP 118/72 | HR 77 | Temp 98.1°F | Ht 66.0 in | Wt 160.0 lb

## 2023-12-01 DIAGNOSIS — N898 Other specified noninflammatory disorders of vagina: Secondary | ICD-10-CM

## 2023-12-01 DIAGNOSIS — N3000 Acute cystitis without hematuria: Secondary | ICD-10-CM

## 2023-12-01 DIAGNOSIS — R350 Frequency of micturition: Secondary | ICD-10-CM | POA: Diagnosis not present

## 2023-12-01 DIAGNOSIS — N920 Excessive and frequent menstruation with regular cycle: Secondary | ICD-10-CM

## 2023-12-01 DIAGNOSIS — B3731 Acute candidiasis of vulva and vagina: Secondary | ICD-10-CM

## 2023-12-01 LAB — WET PREP FOR TRICH, YEAST, CLUE

## 2023-12-01 NOTE — Progress Notes (Signed)
 63 y.o. [id number] postmenopausal female with CIN 1 (2025, +margin), hx of LEEP (1990), hx of cosmetic labioplasty (2022), osteoporosis (managed by endocrine) here for problem visit: 4 week status post LEEP for ASC-H, HPV neg. Widowed, now dating. GYN at [facility] left. Last seen last year with abnormal PAP.  She has been spotting off & on for about 3 days, and has urinary tract discomfort: frequency & urge to urinate   Sexually active: Yes H/o abnl PAP: yes Last PAP: 2025 ASC-H, HPV neg   10/30/23 path: A. CERVIX, LEEP:       Low-grade squamous intraepithelial lesion (LSIL / CIN1).       LSIL extends to endocervical margin.       No evidence of high-grade dysplasia.   B. CERVIX, LEEP (lower LEEP specimen):       Low-grade squamous intraepithelial lesion (LSIL / CIN1).       LSIL extends to endocervical and ectocervical margins.       No evidence of high-grade dysplasia.   C. ENDOCERVIX, LEEP:       Endocervical mucosa with chronic inflammation.       Negative for squamous intraepithelial lesion.       See comment.   D. ENDOCERVIX, CURETTAGE:       Insufficient for evaluation.   GYN HISTORY: Prior LEEP Abnormal PAP 2022  OB History  Gravida Para Term Preterm AB Living  8    8   SAB IAB Ectopic Multiple Live Births  8        # Outcome Date GA Lbr Len/2nd Weight Sex Type Anes PTL Lv  8 SAB           7 SAB           6 SAB           5 SAB           4 SAB           3 SAB           2 SAB           1 SAB            Past Medical History:  Diagnosis Date   Depression    Diabetes mellitus without complication (HCC)    GERD (gastroesophageal reflux disease)    Hypertension    Sleep apnea    Has not worn CPAP in many years   Past Surgical History:  Procedure Laterality Date   COLONOSCOPY  2013   hypo plastic polyps at [facility] in [place]   COLONOSCOPY WITH ESOPHAGOGASTRODUODENOSCOPY (EGD)  11/11/2021   DIAGNOSTIC LAPAROSCOPY     DILATION AND CURETTAGE OF UTERUS N/A  05/09/2020   Procedure: CERVICAL DILATATION AND ENDOCERVICAL CURETTAGE;  Surgeon: Storm Setter, DO;  Location: [facility];  Service: Gynecology;  Laterality: N/A;   LABIOPLASTY Left 08/10/2017   Procedure: LABIAPLASTY REVISION;  Surgeon: Timmie Norris, MD;  Location: [facility];  Service: Gynecology;  Laterality: Left;  Labiaplasty revision   LEEP N/A 10/30/2023   Procedure: LEEP (LOOP ELECTROSURGICAL EXCISION PROCEDURE);  Surgeon: Dallie Vera GAILS, MD;  Location: [facility] OR;  Service: Gynecology;  Laterality: N/A;   UPPER GASTROINTESTINAL ENDOSCOPY     WRIST SURGERY  11/2022   Current Outpatient Medications on File Prior to Visit  Medication Sig Dispense Refill   ALPRAZolam  (XANAX ) 0.5 MG tablet Take 1 tablet (0.5 mg total) by mouth at bedtime as needed for anxiety. 30  tablet 2   ASPIRIN 81 PO Take 81 mg by mouth daily in the afternoon.     atorvastatin  (LIPITOR) 10 MG tablet Take 1 tablet (10 mg total) by mouth daily. 90 tablet 1   buPROPion  (WELLBUTRIN  XL) 300 MG 24 hr tablet Take 1 tablet (300 mg total) by mouth daily. 90 tablet 1   busPIRone  (BUSPAR ) 10 MG tablet Take 1 tablet (10 mg total) by mouth 3 (three) times daily. 90 tablet 2   citalopram  (CELEXA ) 40 MG tablet Take 1 tablet (40 mg total) by mouth daily. 90 tablet 1   Continuous Glucose Receiver (DEXCOM G7 RECEIVER) DEVI Use as directed. 9 each 1   Continuous Glucose Sensor (DEXCOM G7 SENSOR) MISC Attach to skin as directed. Replace every 10 days. 3 each 3   Cyanocobalamin  (VITAMIN B12) 1000 MCG TBCR Take by mouth.     dapagliflozin  propanediol (FARXIGA ) 10 MG TABS tablet Take 1 tablet (10 mg total) by mouth daily before breakfast. 90 tablet 1   dicyclomine  (BENTYL ) 10 MG capsule Take 1 capsule (10 mg total) by mouth 3 (three) times daily as needed. 90 capsule 0   Estradiol  10 MCG TABS vaginal tablet Place 1 tablet (10 mcg total) vaginally 2 (two) times a week. 24 tablet 3   eszopiclone  (LUNESTA ) 2 MG  TABS tablet Take 1 tablet (2 mg total) by mouth at bedtime as needed for sleep. Take immediately before bedtime 90 tablet 0   loratadine (CLARITIN) 10 MG tablet      meloxicam  (MOBIC ) 15 MG tablet Take 1 tablet (15 mg total) by mouth daily. 30 tablet 1   pantoprazole  (PROTONIX ) 20 MG tablet Take 1 tablet (20 mg) by mouth daily. 90 tablet 3   UNABLE TO FIND Med Name: Magnesium Sleeplex by [employer]     VITAMIN D  PO Take 5,000 Units by mouth.     alendronate  (FOSAMAX ) 70 MG tablet Take 1 tablet (70 mg total) by mouth every 7 (seven) days. Take with a full glass of water on an empty stomach. (Patient not taking: Reported on 12/01/2023) 12 tablet 3   No current facility-administered medications on file prior to visit.   Allergies  Allergen Reactions   Flagyl [Metronidazole] Diarrhea   Metformin And Related Shortness Of Breath     PE Today's Vitals   12/01/23 1519  BP: 118/72  Pulse: 77  Temp: 98.1 F (36.7 C)  TempSrc: Oral  SpO2: 95%  Weight: 160 lb (72.6 kg)  Height: 5' 6 (1.676 m)    Body mass index is 25.82 kg/m.  Physical Exam Vitals reviewed. Exam conducted with a chaperone present.  Constitutional:      General: She is not in acute distress.    Appearance: Normal appearance.  HENT:     Head: Normocephalic and atraumatic.     Nose: Nose normal.  Eyes:     Extraocular Movements: Extraocular movements intact.     Conjunctiva/sclera: Conjunctivae normal.  Pulmonary:     Effort: Pulmonary effort is normal.  Genitourinary:    Exam position: Lithotomy position.     Vagina: Vaginal discharge present.     Cervix: Normal. No cervical motion tenderness, discharge or lesion.     Uterus: Normal. Not enlarged and not tender.      Adnexa: Right adnexa normal and left adnexa normal.     Comments: Leep bed healing well, small spotting noted Musculoskeletal:        General: Normal range of motion.  Cervical back: Normal range of motion.  Neurological:     General: No  focal deficit present.     Mental Status: She is alert.  Psychiatric:        Mood and Affect: Mood normal.        Behavior: Behavior normal.      Assessment and Plan:        Urinary frequency -     Urinalysis,Complete w/RFL Culture  Spotting  Vaginal discharge -     WET PREP FOR TRICH, YEAST, CLUE  Yeast vaginitis  Complete diflucan course, only took 1 tab the last time 1 tab q72hr now Will f/u [facility]   Vera LULLA Pa, MD

## 2023-12-01 NOTE — Telephone Encounter (Signed)
 FYI Only or Action Required?: FYI only for provider: appointment scheduled on 11.12.25.  Patient was last seen in primary care on 10/16/2023 by Geofm Glade PARAS, MD.  Called Nurse Triage reporting Headache.  Symptoms began several days ago.  Interventions attempted: OTC medications: saline washes.  Symptoms are: gradually worsening.  Triage Disposition: See PCP When Office is Open (Within 3 Days)  Patient/caregiver understands and will follow disposition?: Yes   Copied from CRM #[id number]. Topic: Clinical - Red Word Triage >> Dec 01, 2023  1:15 PM Roselie BROCKS wrote: [facility] that prompted transfer to Nurse Triage: Patient states she has really bad pressure in her head, with worsening pain, and bad painful headache, feels all jammed up. Reason for Disposition  [1] Sinus congestion (pressure, fullness) AND [2] present > 10 days  Answer Assessment - Initial Assessment Questions Pt states that she has a history of colds turning into sinus infections. States she has had a lingering cold since the end of October. She states about 3 days ago she began to have sinus pressure worsening and headache. She states this is what is typically feels like when she gets a sinus infection.      1. LOCATION: Where does it hurt?      Headache, sinus pressure  2. ONSET: When did the sinus pain start?  (e.g., hours, days)      About 3 days ago 3. SEVERITY: How bad is the pain?   (Scale 0-10; or none, mild, moderate or severe)     5 4. RECURRENT SYMPTOM: Have you ever had sinus problems before? If Yes, ask: When was the last time? and What happened that time?      Yes - this is what happens and turns into sinus infection.  5. NASAL CONGESTION: Is the nose blocked? If Yes, ask: Can you open it or must you breathe through your mouth?     Blocked  6. NASAL DISCHARGE: Do you have discharge from your nose? If so ask, What color?     Clear thick and sticky 7. FEVER: Do you have a fever? If  Yes, ask: What is it, how was it measured, and when did it start?      no 8. OTHER SYMPTOMS: Do you have any other symptoms? (e.g., sore throat, cough, earache, difficulty breathing)     Headache, throat clearing.  Protocols used: Sinus Pain or Congestion-A-AH

## 2023-12-02 ENCOUNTER — Encounter: Payer: Self-pay | Admitting: Internal Medicine

## 2023-12-02 ENCOUNTER — Other Ambulatory Visit (HOSPITAL_COMMUNITY): Payer: Self-pay

## 2023-12-02 ENCOUNTER — Ambulatory Visit: Admitting: Internal Medicine

## 2023-12-02 VITALS — BP 136/84 | HR 62 | Temp 97.9°F | Resp 16 | Ht 66.0 in | Wt 161.2 lb

## 2023-12-02 DIAGNOSIS — J0140 Acute pansinusitis, unspecified: Secondary | ICD-10-CM

## 2023-12-02 DIAGNOSIS — I10 Essential (primary) hypertension: Secondary | ICD-10-CM | POA: Diagnosis not present

## 2023-12-02 DIAGNOSIS — D1801 Hemangioma of skin and subcutaneous tissue: Secondary | ICD-10-CM | POA: Diagnosis not present

## 2023-12-02 DIAGNOSIS — L814 Other melanin hyperpigmentation: Secondary | ICD-10-CM | POA: Diagnosis not present

## 2023-12-02 DIAGNOSIS — D485 Neoplasm of uncertain behavior of skin: Secondary | ICD-10-CM | POA: Diagnosis not present

## 2023-12-02 DIAGNOSIS — L821 Other seborrheic keratosis: Secondary | ICD-10-CM | POA: Diagnosis not present

## 2023-12-02 MED ORDER — AMOXICILLIN-POT CLAVULANATE 875-125 MG PO TABS
1.0000 | ORAL_TABLET | Freq: Two times a day (BID) | ORAL | 0 refills | Status: AC
  Filled 2023-12-02: qty 20, 10d supply, fill #0

## 2023-12-02 NOTE — Progress Notes (Signed)
 Subjective:  Patient ID: [name], female    DOB: Feb 12, 1960  Age: 63 y.o. MRN: [id number]  CC: Hypertension and Sinusitis   HPI [name] presents for f/up ---  Discussed the use of AI scribe software for clinical note transcription with the patient, who gave verbal consent to proceed.  History of Present Illness [name] is a 63 year old female who presents with sinus congestion and headache.  She began experiencing symptoms of a cold on October 29th, which persisted for several days. Approximately five days ago, she developed significant sinus drainage, which has since stopped, leading to congestion and pressure in her sinuses. The sinus pain is described as pressure affecting all her sinuses, accompanied by headaches across her forehead.  She denies fever, chills, or epistaxis. She experienced ear discomfort a few days ago, which has resolved. She reports a cough with phlegm but no chest cough. Initially, she had a sore throat for the first five or six days of her illness.  She has been using DayQuil, which contains a nasal decongestant, to manage her symptoms. She notes that her blood pressure has been slightly elevated.  She recently returned from a trip to [place], where she initially caught the cold.  Outpatient Medications Prior to Visit  Medication Sig Dispense Refill   ALPRAZolam  (XANAX ) 0.5 MG tablet Take 1 tablet (0.5 mg total) by mouth at bedtime as needed for anxiety. 30 tablet 2   ASPIRIN 81 PO Take 81 mg by mouth daily in the afternoon.     atorvastatin  (LIPITOR) 10 MG tablet Take 1 tablet (10 mg total) by mouth daily. 90 tablet 1   buPROPion  (WELLBUTRIN  XL) 300 MG 24 hr tablet Take 1 tablet (300 mg total) by mouth daily. 90 tablet 1   busPIRone  (BUSPAR ) 10 MG tablet Take 1 tablet (10 mg total) by mouth 3 (three) times daily. 90 tablet 2   citalopram  (CELEXA ) 40 MG tablet Take 1 tablet (40 mg total) by mouth daily. 90 tablet 1   Continuous Glucose  Receiver (DEXCOM G7 RECEIVER) DEVI Use as directed. 9 each 1   Continuous Glucose Sensor (DEXCOM G7 SENSOR) MISC Attach to skin as directed. Replace every 10 days. 3 each 3   Cyanocobalamin  (VITAMIN B12) 1000 MCG TBCR Take by mouth.     dapagliflozin  propanediol (FARXIGA ) 10 MG TABS tablet Take 1 tablet (10 mg total) by mouth daily before breakfast. 90 tablet 1   dicyclomine  (BENTYL ) 10 MG capsule Take 1 capsule (10 mg total) by mouth 3 (three) times daily as needed. 90 capsule 0   Estradiol  10 MCG TABS vaginal tablet Place 1 tablet (10 mcg total) vaginally 2 (two) times a week. 24 tablet 3   eszopiclone  (LUNESTA ) 2 MG TABS tablet Take 1 tablet (2 mg total) by mouth at bedtime as needed for sleep. Take immediately before bedtime 90 tablet 0   loratadine (CLARITIN) 10 MG tablet      meloxicam  (MOBIC ) 15 MG tablet Take 1 tablet (15 mg total) by mouth daily. 30 tablet 1   pantoprazole  (PROTONIX ) 20 MG tablet Take 1 tablet (20 mg) by mouth daily. 90 tablet 3   UNABLE TO FIND Med Name: Magnesium Sleeplex by [employer]     VITAMIN D  PO Take 5,000 Units by mouth.     alendronate  (FOSAMAX ) 70 MG tablet Take 1 tablet (70 mg total) by mouth every 7 (seven) days. Take with a full glass of water on an empty stomach. (  Patient not taking: Reported on 12/02/2023) 12 tablet 3   No facility-administered medications prior to visit.    ROS Review of Systems  Constitutional:  Negative for appetite change, chills, diaphoresis, fatigue and fever.  HENT:  Positive for congestion, postnasal drip, rhinorrhea, sinus pressure, sinus pain and sore throat. Negative for ear pain, nosebleeds and trouble swallowing.   Eyes: Negative.   Respiratory: Negative.  Negative for cough, chest tightness, wheezing and stridor.   Cardiovascular:  Negative for chest pain, palpitations and leg swelling.  Gastrointestinal:  Negative for abdominal pain, constipation, diarrhea, nausea and vomiting.  Endocrine: Negative.    Genitourinary: Negative.  Negative for difficulty urinating.  Musculoskeletal: Negative.  Negative for arthralgias, back pain, neck pain and neck stiffness.  Skin: Negative.   Neurological: Negative.  Negative for dizziness, weakness and headaches.  Hematological:  Negative for adenopathy. Does not bruise/bleed easily.  Psychiatric/Behavioral: Negative.      Objective:  BP 136/84 (BP Location: Left Arm, Patient Position: Sitting, Cuff Size: Normal)   Pulse 62   Temp 97.9 F (36.6 C) (Oral)   Resp 16   Ht 5' 6 (1.676 m)   Wt 161 lb 3.2 oz (73.1 kg)   LMP 02/02/2013   SpO2 96%   BMI 26.02 kg/m   BP Readings from Last 3 Encounters:  12/02/23 136/84  12/01/23 118/72  11/05/23 122/74    Wt Readings from Last 3 Encounters:  12/02/23 161 lb 3.2 oz (73.1 kg)  12/01/23 160 lb (72.6 kg)  11/05/23 158 lb (71.7 kg)    Physical Exam Vitals reviewed.  HENT:     Right Ear: Hearing, tympanic membrane, ear canal and external ear normal. No middle ear effusion. Tympanic membrane is not injected.     Left Ear: Hearing, tympanic membrane, ear canal and external ear normal.  No middle ear effusion. Tympanic membrane is not injected.     Nose: Nose normal. No mucosal edema, congestion or rhinorrhea.     Right Nostril: No epistaxis.     Left Nostril: No epistaxis.     Mouth/Throat:     Pharynx: Oropharynx is clear. No pharyngeal swelling or oropharyngeal exudate.     Tonsils: No tonsillar exudate.  Eyes:     General: No scleral icterus.    Conjunctiva/sclera: Conjunctivae normal.  Cardiovascular:     Rate and Rhythm: Normal rate and regular rhythm.     Heart sounds: No murmur heard.    No friction rub. No gallop.  Pulmonary:     Effort: Pulmonary effort is normal.     Breath sounds: No stridor. No wheezing, rhonchi or rales.  Abdominal:     General: Abdomen is flat.     Palpations: There is no mass.     Tenderness: There is no abdominal tenderness. There is no guarding.      Hernia: No hernia is present.  Musculoskeletal:        General: Normal range of motion.     Cervical back: Neck supple.     Right lower leg: No edema.     Left lower leg: No edema.  Lymphadenopathy:     Cervical: No cervical adenopathy.  Skin:    General: Skin is warm and dry.  Neurological:     General: No focal deficit present.     Mental Status: Mental status is at baseline.  Psychiatric:        Mood and Affect: Mood normal.        Behavior: Behavior normal.  Lab Results  Component Value Date   WBC 6.4 04/02/2023   HGB 14.3 04/02/2023   HCT 42.0 04/02/2023   PLT 365.0 04/02/2023   GLUCOSE 119 (H) 10/30/2023   CHOL 151 06/26/2023   TRIG 60 06/26/2023   HDL 52 06/26/2023   LDLCALC 87 06/26/2023   ALT 16 06/26/2023   AST 15 06/26/2023   NA 136 10/30/2023   K 5.0 10/30/2023   CL 99 10/30/2023   CREATININE 0.72 10/30/2023   BUN 14 10/30/2023   CO2 23 10/30/2023   TSH 2.430 06/26/2023   INR 1.0 09/18/2006   HGBA1C 6.4 09/29/2023   MICROALBUR <0.7 04/02/2023    No results found.  Assessment & Plan:   Acute non-recurrent pansinusitis -     Amoxicillin-Pot Clavulanate; Take 1 tablet by mouth 2 (two) times daily for 10 days.  Dispense: 20 tablet; Refill: 0  Primary hypertension- Her BP is adequately well controlled.     Follow-up: Return in about 3 months (around 03/03/2024).  Debby Molt, MD

## 2023-12-02 NOTE — Patient Instructions (Signed)

## 2023-12-03 ENCOUNTER — Other Ambulatory Visit (HOSPITAL_COMMUNITY): Payer: Self-pay

## 2023-12-03 LAB — URINALYSIS, COMPLETE W/RFL CULTURE
Bilirubin Urine: NEGATIVE
Hgb urine dipstick: NEGATIVE
Hyaline Cast: NONE SEEN /LPF
Nitrites, Initial: NEGATIVE
Protein, ur: NEGATIVE
RBC / HPF: NONE SEEN /HPF (ref 0–2)
Specific Gravity, Urine: 1.02 (ref 1.001–1.035)
pH: 5.5 (ref 5.0–8.0)

## 2023-12-03 LAB — CULTURE INDICATED

## 2023-12-03 LAB — URINE CULTURE
MICRO NUMBER:: 17219562
SPECIMEN QUALITY:: ADEQUATE

## 2023-12-03 MED ORDER — NITROFURANTOIN MONOHYD MACRO 100 MG PO CAPS
100.0000 mg | ORAL_CAPSULE | Freq: Two times a day (BID) | ORAL | 0 refills | Status: AC
  Filled 2023-12-03: qty 10, 5d supply, fill #0

## 2023-12-07 ENCOUNTER — Ambulatory Visit (INDEPENDENT_AMBULATORY_CARE_PROVIDER_SITE_OTHER): Admitting: Psychiatry

## 2023-12-07 DIAGNOSIS — F411 Generalized anxiety disorder: Secondary | ICD-10-CM | POA: Diagnosis not present

## 2023-12-07 NOTE — Progress Notes (Unsigned)
 [facility] Counselor/Therapist Progress Note  Patient ID: [name], MRN: [id number],    Date: 12/07/2023  Time Spent: 52 minutes start time 10:56 AM end time 11:48 AM Virtual Visit via Video Note Connected with patient by a telemedicine/telehealth application, with their informed consent, and verified patient privacy and that I am speaking with the correct person using two identifiers. I discussed the limitations, risks, security and privacy concerns of performing psychotherapy and the availability of in person appointments. I also discussed with the patient that there may be a patient responsible charge related to this service. The patient expressed understanding and agreed to proceed. I discussed the treatment planning with the patient. The patient was provided an opportunity to ask questions and all were answered. The patient agreed with the plan and demonstrated an understanding of the instructions. The patient was advised to call  our office if  symptoms worsen or feel they are in a crisis state and need immediate contact.   Therapist Location: home Patient Location: home    Treatment Type: Individual Therapy  Reported Symptoms: sadness, anxiety, rumination, triggered responses  Mental Status Exam:  Appearance:   Casual     Behavior:  Appropriate  Motor:  Normal  Speech/Language:   Normal Rate  Affect:  Appropriate and Tearful  Mood:  anxious and sad  Thought process:  circumstantial  Thought content:    WNL  Sensory/Perceptual disturbances:    WNL  Orientation:  oriented to person, place, time/date, and situation  Attention:  Good  Concentration:  Good  Memory:  WNL  Fund of knowledge:   Good  Insight:    Good  Judgment:   Good  Impulse Control:  Good   Risk Assessment: Danger to Self:  No Self-injurious Behavior: No Danger to Others: No Duty to Warn:no Physical Aggression / Violence:No  Access to Firearms a concern: No  Gang Involvement:No    Subjective: Met with patient via virtual session.  Patient reported that she was able to follow through with plans from session for couple days.  She went on to share she started getting overwhelmed so discussed going ahead and lowering the number of items to 10 a day so that she does not get overwhelmed.  She shared she was not doing well due to something that happened last night. She shared that the guy that she has been saying had her go through his phone. While she was doing that she saw a message on the [employer] dating app that suggested he was messaging other people. She shared she is very confused about what happened and she is having a hard time with how he handled it.  Discussed different ways she can handle the situation. She was encouraged to figure out what she needs to figure out what she wants to do in the situation. Discussed DBT skill Pos and Cons and how to give herself a week to work on the list.  Encouraged her to continue finding other ways to release the negative emotions appropriately.  She is going to continue exercising and spending time with family.  Interventions: Dialectical Behavioral Therapy and Solution-Oriented/Positive Psychology  Diagnosis:   ICD-10-CM   1. Generalized anxiety disorder  F41.1       Plan: Patient is to practice grounding skills and DBT skills to decrease anxiety symptoms.  Patient is to follow plans to find 10 things to take to [facility] and 10 things to throw away each day. Patient is to write to  her husband things that surface that she is hurt by  Patient is to journal and to exercise to release negative emotions appropriately.  Patient is to continue working with provider Regina Mozingo, NP for medication management.     Silvano Pacini, [facility]

## 2023-12-11 ENCOUNTER — Other Ambulatory Visit (HOSPITAL_COMMUNITY): Payer: Self-pay

## 2023-12-11 ENCOUNTER — Ambulatory Visit: Admitting: Adult Health

## 2023-12-11 ENCOUNTER — Encounter: Payer: Self-pay | Admitting: Adult Health

## 2023-12-11 DIAGNOSIS — F418 Other specified anxiety disorders: Secondary | ICD-10-CM

## 2023-12-11 DIAGNOSIS — F5102 Adjustment insomnia: Secondary | ICD-10-CM | POA: Diagnosis not present

## 2023-12-11 DIAGNOSIS — F331 Major depressive disorder, recurrent, moderate: Secondary | ICD-10-CM

## 2023-12-11 MED ORDER — BUSPIRONE HCL 10 MG PO TABS
20.0000 mg | ORAL_TABLET | Freq: Two times a day (BID) | ORAL | 1 refills | Status: AC
  Filled 2023-12-11: qty 360, fill #0
  Filled 2023-12-17: qty 360, 90d supply, fill #0

## 2023-12-11 NOTE — Progress Notes (Signed)
 [name] [id number] [birth year] 63 y.o.  Subjective:   Patient ID:  [name] is a 63 y.o. (DOB 1960-04-21) female.  Chief Complaint: No chief complaint on file.   HPI [name] presents to the office today for follow-up of MDD, situational anxiety and adjustment insomnia.  Working with Silvano Pacini  Describes mood today as ok. Pleasant. Denies tearfulness. Mood symptoms - denies depression. Reports stable interest and motivation. Reports anxiety has improved. Denies irritability. Denies panic attacks. Reports being fearful at times - future. Denies worry, rumination and over thinking - has moments of fear. Continues to grieve loss of husband, but has started to date again. Reports mood is stable. Stating I feel like I'm doing ok. Feels like current medications are helpful. Taking medications as prescribed.  Energy levels better. Active, has a regular exercise routine.   Enjoys some usual interests and activities. Lives alone with 2 dogs. Brother living with her. Spending time with family - upcoming family beach trip. Appetite adequate. Weight stable. Sleeps better some nights than others. Averages 6 hours.  Reports focus and concentration stable. Completing tasks. Managing aspects of household. Retired. Denies SI or HI.  Denies AH or VH. Denies self harm. Denies substance use.  Previous medication trials: Wellbutrin , Celexa     AUDIT    Flowsheet Row Office Visit from 12/02/2023 in [facility]  Alcohol Use Disorder Identification Test Final Score (AUDIT) 5    GAD-7    Flowsheet Row Counselor from 09/22/2023 in [facility] Office Visit from [facility]/19/20[facility] in [facility]  Total GAD-7 Score 13 21   PHQ2-9    Flowsheet Row Office Visit from 09/10/2023 in [facility] Office Visit from 04/02/2023 in [facility] Office Visit from 08/06/2022 in [facility] Office Visit from 06[facility]19/2022 in [facility] Office Visit from 08/21/2021 in [facility]  PHQ-2 Total Score 0 0 4 0 0  PHQ-9 Total Score -- 0 8 -- 5   Flowsheet Row Admission (Discharged) from 10/30/2023 in [facility] from 08/14/2023 in [facility] Admission (Discharged) from 05/09/2020 in [facility]  C-SSRS RISK CATEGORY No Risk No Risk No Risk     Review of Systems:  Review of Systems  Musculoskeletal:  Negative for gait problem.  Neurological:  Negative for tremors.  Psychiatric/Behavioral:         Please refer to HPI    Medications: I have reviewed the patient's current medications.  Current Outpatient Medications  Medication Sig Dispense Refill   alendronate  (FOSAMAX ) 70 MG tablet Take 1 tablet (70 mg total) by mouth every 7 (seven) days. Take with a full glass of water on an empty stomach. (Patient not taking: Reported on 12/02/2023) 12 tablet 3   ALPRAZolam  (XANAX ) 0.5 MG tablet Take 1 tablet (0.5 mg total) by mouth at bedtime as needed for anxiety. 30 tablet 2   amoxicillin -clavulanate (AUGMENTIN ) 875-125 MG tablet Take 1 tablet by mouth 2 (two) times daily for 10 days. 20 tablet 0   ASPIRIN 81 PO Take 81 mg by mouth daily in the afternoon.     atorvastatin  (LIPITOR) 10 MG tablet Take 1 tablet (10 mg total) by mouth daily. 90 tablet 1   buPROPion  (WELLBUTRIN  XL) 300 MG 24 hr tablet Take  1 tablet (300 mg total) by mouth daily. 90 tablet 1   busPIRone  (BUSPAR ) 10 MG tablet Take 1 tablet (10 mg total) by mouth 3 (three) times daily. 90 tablet 2   citalopram  (CELEXA ) 40 MG tablet Take 1 tablet (40 mg total) by mouth daily. 90 tablet 1   Continuous Glucose Receiver (DEXCOM G7 RECEIVER) DEVI Use as directed. 9 each 1   Continuous Glucose Sensor (DEXCOM G7 SENSOR) MISC Attach to skin as  directed. Replace every 10 days. 3 each 3   Cyanocobalamin  (VITAMIN B12) 1000 MCG TBCR Take by mouth.     dapagliflozin  propanediol (FARXIGA ) 10 MG TABS tablet Take 1 tablet (10 mg total) by mouth daily before breakfast. 90 tablet 1   dicyclomine  (BENTYL ) 10 MG capsule Take 1 capsule (10 mg total) by mouth 3 (three) times daily as needed. 90 capsule 0   Estradiol  10 MCG TABS vaginal tablet Place 1 tablet (10 mcg total) vaginally 2 (two) times a week. 24 tablet 3   eszopiclone  (LUNESTA ) 2 MG TABS tablet Take 1 tablet (2 mg total) by mouth at bedtime as needed for sleep. Take immediately before bedtime 90 tablet 0   loratadine (CLARITIN) 10 MG tablet      meloxicam  (MOBIC ) 15 MG tablet Take 1 tablet (15 mg total) by mouth daily. 30 tablet 1   pantoprazole  (PROTONIX ) 20 MG tablet Take 1 tablet (20 mg) by mouth daily. 90 tablet 3   UNABLE TO FIND Med Name: Magnesium Sleeplex by [employer]     VITAMIN D  PO Take 5,000 Units by mouth.     No current facility-administered medications for this visit.    Medication Side Effects: None  Allergies:  Allergies  Allergen Reactions   Flagyl [Metronidazole] Diarrhea   Metformin And Related Shortness Of Breath    Past Medical History:  Diagnosis Date   Depression    Diabetes mellitus without complication (HCC)    GERD (gastroesophageal reflux disease)    Hypertension    Sleep apnea    Has not worn CPAP in many years    Past Medical History, Surgical history, Social history, and Family history were reviewed and updated as appropriate.   Please see review of systems for further details on the patient's review from today.   Objective:   Physical Exam:  LMP 02/02/2013   Physical Exam Constitutional:      General: She is not in acute distress. Musculoskeletal:        General: No deformity.  Neurological:     Mental Status: She is alert and oriented to person, place, and time.     Coordination: Coordination normal.  Psychiatric:         Attention and Perception: Attention and perception normal. She does not perceive auditory or visual hallucinations.        Mood and Affect: Mood normal. Mood is not anxious or depressed. Affect is not labile, blunt, angry or inappropriate.        Speech: Speech normal.        Behavior: Behavior normal.        Thought Content: Thought content normal. Thought content is not paranoid or delusional. Thought content does not include homicidal or suicidal ideation. Thought content does not include homicidal or suicidal plan.        Cognition and Memory: Cognition and memory normal.        Judgment: Judgment normal.     Comments: Insight intact     Lab Review:  Component Value Date/Time   NA 136 10/30/2023 1115   NA 139 06/26/2023 0748   K 5.0 10/30/2023 1115   CL 99 10/30/2023 1115   CO2 23 10/30/2023 1115   GLUCOSE 119 (H) 10/30/2023 1115   BUN 14 10/30/2023 1115   BUN 23 06/26/2023 0748   CREATININE 0.72 10/30/2023 1115   CALCIUM  8.9 10/30/2023 1115   PROT 7.2 06/26/2023 0748   ALBUMIN 4.7 06/26/2023 0748   AST 15 06/26/2023 0748   ALT 16 06/26/2023 0748   ALKPHOS 85 06/26/2023 0748   BILITOT 0.6 06/26/2023 0748   GFRNONAA >60 10/30/2023 1115   GFRAA >60 08/06/2017 1924       Component Value Date/Time   WBC 6.4 04/02/2023 1202   RBC 4.29 04/02/2023 1202   HGB 14.3 04/02/2023 1202   HCT 42.0 04/02/2023 1202   PLT 365.0 04/02/2023 1202   MCV 98.1 04/02/2023 1202   MCH 31.8 02/05/2014 2146   MCHC 33.9 04/02/2023 1202   RDW 13.2 04/02/2023 1202   LYMPHSABS 2.5 04/02/2023 1202   MONOABS 0.7 04/02/2023 1202   EOSABS 0.4 04/02/2023 1202   BASOSABS 0.0 04/02/2023 1202    No results found for: POCLITH, LITHIUM   No results found for: PHENYTOIN, PHENOBARB, VALPROATE, CBMZ   .res Assessment: Plan:     Treatment Plan/Recommendations:  Current medications: Wellbutrin  XL 300mg  Celexa  40mg  daily Xanax  0.5mg  at hs for sleep  Increase Buspar  15mg  BID to  20mg  BID  RTC 3 months  20 minutes spent dedicated to the care of this patient on the date of this encounter to include pre-visit review of records, ordering of medication, post visit documentation, and face-to-face time with the patient discussing MDD, situational anxiety and adjustment insomnia. Discussed continuing current medication regimen.   Discussed potential benefits, risk, and side effects of benzodiazepines to include potential risk of tolerance and dependence, as well as possible drowsiness.  Advised patient not to drive if experiencing drowsiness and to take lowest possible effective dose to minimize risk of dependence and tolerance.   There are no diagnoses linked to this encounter.   Please see After Visit Summary for patient specific instructions.  Future Appointments  Date Time Provider Department Center  12/11/2023  3:00 PM Braysen Cloward, Angeline Mattocks, NP [facility] None  12/14/2023 10:00 AM Gail Castilla, [facility] None  12/21/2023 11:00 AM Gail Castilla, [facility] None  12/31/2023  2:00 PM Gail Castilla, [facility] None  01/05/2024  9:40 AM Joshua Debby CROME, MD [facility]  03/28/2024 11:30 AM Nida, Ethelle ORN, MD [facility] None    No orders of the defined types were placed in this encounter.   -------------------------------

## 2023-12-14 ENCOUNTER — Encounter: Payer: Self-pay | Admitting: Psychiatry

## 2023-12-14 ENCOUNTER — Other Ambulatory Visit (HOSPITAL_COMMUNITY): Payer: Self-pay

## 2023-12-14 ENCOUNTER — Ambulatory Visit (INDEPENDENT_AMBULATORY_CARE_PROVIDER_SITE_OTHER): Admitting: Psychiatry

## 2023-12-14 DIAGNOSIS — F411 Generalized anxiety disorder: Secondary | ICD-10-CM | POA: Diagnosis not present

## 2023-12-14 NOTE — Progress Notes (Unsigned)
 [facility] Counselor/Therapist Progress Note  Patient ID: [name], MRN: [id number],    Date: 12/14/2023  Time Spent: 57 minutes start time 10:01 AM end time 10:58 AM Virtual Visit via Video Note Connected with patient by a telemedicine/telehealth application, with their informed consent, and verified patient privacy and that I am speaking with the correct person using two identifiers. I discussed the limitations, risks, security and privacy concerns of performing psychotherapy and the availability of in person appointments. I also discussed with the patient that there may be a patient responsible charge related to this service. The patient expressed understanding and agreed to proceed. I discussed the treatment planning with the patient. The patient was provided an opportunity to ask questions and all were answered. The patient agreed with the plan and demonstrated an understanding of the instructions. The patient was advised to call  our office if  symptoms worsen or feel they are in a crisis state and need immediate contact.   Therapist Location: home Patient Location: home    Treatment Type: Individual Therapy  Reported Symptoms: anxiety, grief issues, triggered responses, rumination, focusing issues  Mental Status Exam:  Appearance:   Well Groomed     Behavior:  Appropriate  Motor:  Normal  Speech/Language:   Normal Rate  Affect:  Appropriate  Mood:  normal  Thought process:  normal  Thought content:    WNL  Sensory/Perceptual disturbances:    WNL  Orientation:  oriented to person, place, time/date, and situation  Attention:  Good  Concentration:  Good  Memory:  WNL  Fund of knowledge:   Good  Insight:    Good  Judgment:   Good  Impulse Control:  Good   Risk Assessment: Danger to Self:  No Self-injurious Behavior: No Danger to Others: No Duty to Warn:no Physical Aggression / Violence:No  Access to Firearms a concern: No  Gang Involvement:No    Subjective: Met with patient via virtual session. She shared she talked more with the guy she had been seeing. She shared she had decided to believe him and let what happened go. She shared it was hard to stop the replaying it in her head but she was able to get to the other side of it.  She shared she was able to take time to analyze the situation and she was able to see that she does not have to stay in the relationship which was empowering for her. She shared she feels much better since they talked and are closer now. She went on to share she is ready to try and figure out her house situation. She is going to have Thanksgiving at her house this year for the first time since 2013. She shared she is feeling her life had been minimized to what her husband wanted it to be. She went on to hare that her new guy has volunteered to help her work on her bedroom which she knows will make a big difference for her. She shared she is having fewer times of grief which is progress. She shared she was able to get over her sinus infection and bladder infection. She also has started taking boxing lessons. Did processing set on feeling stuck in her marriage due to not being able to find things or having people over due to his hoarding. SUDS level 10, negative cognition I'm powerless, felt anger in her arms and chest. Patient was able to reduce SUDS level 7. She did see that her brother  living with her is making it challenging and she is ready for him to find his own place.  Encouraged her to have that discussion with him when she feels ready.  Interventions: Eye Movement Desensitization and Reprocessing (EMDR) and Insight-Oriented  Diagnosis:   ICD-10-CM   1. Generalized anxiety disorder  F41.1       Plan:  Patient is to practice grounding skills and DBT skills to decrease anxiety symptoms.  Patient is to continue working on finding 10 things to take to [facility] and 10 things to throw away each day. Patient is to  write to her husband things that surface that she is hurt by  Patient is to journal and to exercise to release negative emotions appropriately.  Patient is to continue working with provider Regina Mozingo, NP for medication management.   Silvano Pacini, [facility]

## 2023-12-18 ENCOUNTER — Other Ambulatory Visit (HOSPITAL_COMMUNITY): Payer: Self-pay

## 2023-12-21 ENCOUNTER — Encounter: Payer: Self-pay | Admitting: Psychiatry

## 2023-12-21 ENCOUNTER — Ambulatory Visit: Admitting: Psychiatry

## 2023-12-21 DIAGNOSIS — F411 Generalized anxiety disorder: Secondary | ICD-10-CM

## 2023-12-21 NOTE — Progress Notes (Signed)
 [facility] Counselor/Therapist Progress Note  Patient ID: [name], MRN: [id number],    Date: 12/21/2023  Time Spent: 55 minutes start time 10:58 AM end time 11:53 AM Virtual Visit via Video Note Connected with patient by a telemedicine/telehealth application, with their informed consent, and verified patient privacy and that I am speaking with the correct person using two identifiers. I discussed the limitations, risks, security and privacy concerns of performing psychotherapy and the availability of in person appointments. I also discussed with the patient that there may be a patient responsible charge related to this service. The patient expressed understanding and agreed to proceed. I discussed the treatment planning with the patient. The patient was provided an opportunity to ask questions and all were answered. The patient agreed with the plan and demonstrated an understanding of the instructions. The patient was advised to call  our office if  symptoms worsen or feel they are in a crisis state and need immediate contact.   Therapist Location: home Patient Location: home    Treatment Type: Individual Therapy  Reported Symptoms: fatigue, anxiety, rumination, grief issues, sleeping issues  Mental Status Exam:  Appearance:   Casual     Behavior:  Appropriate  Motor:  Normal  Speech/Language:   Normal Rate  Affect:  Appropriate  Mood:  anxious  Thought process:  normal  Thought content:    WNL  Sensory/Perceptual disturbances:    WNL  Orientation:  oriented to person, place, time/date, and situation  Attention:  Good  Concentration:  Good  Memory:  WNL  Fund of knowledge:   Good  Insight:    Good  Judgment:   Good  Impulse Control:  Good   Risk Assessment: Danger to Self:  No Self-injurious Behavior: no Danger to Others: No Duty to Warn:no Physical Aggression / Violence:No  Access to Firearms a concern: No  Gang Involvement:No   Subjective: Met with  patient via virtual session. She shared that she had a great Thanksgiving at her home. She went on to share that it was so nice for her to see her work being worth it.  She shared that this was the first time she has had people over since 2013. Discussed how that is huge progress. She went on to share that she is tired and feels frustrated due to not working at the same. Encouraged her to realize that the getting rid of stuff is also letting to go emotions so she has to pace herself. She shared she is having anxiety about the future.  Had her think through the things that are creating the anxiety.  As she was discussing the different situations she was able to recognize that she is still trying to fix things for other people and that is not her job.  She was also able to states she is worrying about things that may or may not happen and she can work on releasing them.  Patient was encouraged to think through what are the immediate things that she can do that we will prepare for what ever is to come in the future.  She was able to recognize that continuing to go through things at her house and releasing things from the past is what she can do currently.  Had her think through small tasks that she could potentially complete that would help her to feel better.  As she was going through the discussion she realized that she had found a box of cards from her  and his mother to her late husband.  She shared she is struggling with releasing it because she does not want to let go of his history.  Encouraged her to think about what would happen if something were to happen to her regarding those cards.  Discussed the possibility of her picking out the favorite 1 from his mother and the favorite one from her and hanging onto those 2 and letting the other ones go.  Patient was also able to recognize that part of her issue was she has too much stuff and has to go through things to be able to put them away.  Did some brainstorming  on how she could structure things different so that she can get some stuff put away and keep releasing stuff as well.  Patient was able to think through some CBT filters to help her talk or self through the process.  Patient was also encouraged to write out a future list rather than a to do list because the to do list was starting to create anxiety for her.  Discussed the importance of reframing words so that it feels more tangible and doable and then her brain is more likely to complete them.  Interventions: Cognitive Behavioral Therapy, Solution-Oriented/Positive Psychology, and Insight-Oriented  Diagnosis:   ICD-10-CM   1. Generalized anxiety disorder  F41.1       Plan:  Patient is to practice grounding skills and DBT skills to decrease anxiety symptoms.  Patient is to continue working on finding 10 things to take to [facility] and 10 things to throw away each day. Patient is to write to her husband things that surface that she is hurt by  Patient is to journal and to exercise to release negative emotions appropriately.  Patient is to continue working with provider Regina Mozingo, NP for medication management.   Silvano Pacini, [facility]

## 2023-12-24 ENCOUNTER — Telehealth: Payer: Self-pay | Admitting: Neurology

## 2023-12-24 DIAGNOSIS — R202 Paresthesia of skin: Secondary | ICD-10-CM

## 2023-12-24 NOTE — Telephone Encounter (Signed)
 Pt wants to know is it ok to get scheduled for a nerve test. Thanks.

## 2023-12-25 DIAGNOSIS — Z713 Dietary counseling and surveillance: Secondary | ICD-10-CM | POA: Diagnosis not present

## 2023-12-31 ENCOUNTER — Encounter: Payer: Self-pay | Admitting: Psychiatry

## 2023-12-31 ENCOUNTER — Ambulatory Visit: Admitting: Psychiatry

## 2023-12-31 DIAGNOSIS — F411 Generalized anxiety disorder: Secondary | ICD-10-CM | POA: Diagnosis not present

## 2023-12-31 NOTE — Progress Notes (Unsigned)
 [facility] Counselor/Therapist Progress Note  Patient ID: [name], MRN: [id number],    Date: 12/31/2023  Time Spent: 52 minutes start time 2:03 PM end time 2:55 PM  Treatment Type: Individual Therapy  Reported Symptoms: anxiety, sadness, grief issues, overwhelmed,  Mental Status Exam:  Appearance:   Well Groomed     Behavior:  Appropriate  Motor:  Normal  Speech/Language:   Normal Rate  Affect:  Appropriate  Mood:  anxious  Thought process:  normal  Thought content:    WNL  Sensory/Perceptual disturbances:    WNL  Orientation:  oriented to person, place, time/date, and situation  Attention:  Good  Concentration:  Good  Memory:  WNL  Fund of knowledge:   Good  Insight:    Good  Judgment:   Good  Impulse Control:  Good   Risk Assessment: Danger to Self:  No Self-injurious Behavior: No Danger to Others: No Duty to Warn:no Physical Aggression / Violence:No  Access to Firearms a concern: No  Gang Involvement:No   Subjective: Patient was present for session. She shared she has the living room started and she is feeling good about what they have done so far to make some space for people to be there for Christmas. She is trying to be realistic about what she can do.  Patient reported she is feeling so much better by getting going and doing some things.  At this point her guy friend is helping their and that has been a good thing as well.  She shared she still feels some fear regarding the relationship.  She thinks back to what happened and feels anxiety about it.  Patient was encouraged to think through what the facts regarding the relationship.  Also encouraged her to realize that she does not have to make any decisions about forever or the future at this point.  Discussed trying to take things at a much slower basis since they have only been dating for 4 months.  Patient was encouraged to think to only look as far as the next trip since she does like having a  traveling companion and traveling is something that she is really wanting to be able to do.  Patient also shared that she is having some emotions regarding her stepson's.  She shared that 1 of him she feels good about the relationship but the other only contacts her when he is wanting money.  Patient stated that she is feeling that she may need to distance from that relationship and encouraged her to do what she feels is going to be best for her.   Did processing set on patient fear of being stuck in something she can't get out of quickly.   Interventions: Dialectical Behavioral Therapy and Solution-Oriented/Positive Psychology  Diagnosis:   ICD-10-CM   1. Generalized anxiety disorder  F41.1       Plan:  Patient is to practice grounding skills and DBT skills to decrease anxiety symptoms.  Patient is to try and take things just as far as the next trip regarding her relationship.  Patient is to continue working on getting rid of things from her house and encouraging her to feel good about that process.  Patient is to journal and to exercise to release negative emotions appropriately.  Patient is to continue working with provider Regina Mozingo, NP for medication management.   Silvano Pacini, [facility]

## 2024-01-02 ENCOUNTER — Other Ambulatory Visit: Payer: Self-pay | Admitting: Internal Medicine

## 2024-01-02 DIAGNOSIS — F5104 Psychophysiologic insomnia: Secondary | ICD-10-CM

## 2024-01-05 ENCOUNTER — Other Ambulatory Visit (HOSPITAL_COMMUNITY): Payer: Self-pay

## 2024-01-05 ENCOUNTER — Ambulatory Visit: Admitting: "Endocrinology

## 2024-01-05 ENCOUNTER — Ambulatory Visit: Admitting: Internal Medicine

## 2024-01-05 MED ORDER — ESZOPICLONE 2 MG PO TABS
2.0000 mg | ORAL_TABLET | Freq: Every evening | ORAL | 0 refills | Status: AC | PRN
  Filled 2024-01-05: qty 90, 90d supply, fill #0

## 2024-01-12 ENCOUNTER — Other Ambulatory Visit (HOSPITAL_COMMUNITY): Payer: Self-pay

## 2024-01-12 ENCOUNTER — Ambulatory Visit: Admitting: Internal Medicine

## 2024-01-12 NOTE — Progress Notes (Signed)
 "  Subjective:  Patient ID: [name], female    DOB: 02/28/1960  Age: 63 y.o. MRN: [id number]  CC: No chief complaint on file.   HPI [name] presents for not seen  Discussed the use of AI scribe software for clinical note transcription with the patient, who gave verbal consent to proceed.  History of Present Illness      Outpatient Medications Prior to Visit  Medication Sig Dispense Refill   alendronate  (FOSAMAX ) 70 MG tablet Take 1 tablet (70 mg total) by mouth every 7 (seven) days. Take with a full glass of water on an empty stomach. (Patient not taking: Reported on 12/02/2023) 12 tablet 3   ALPRAZolam  (XANAX ) 0.5 MG tablet Take 1 tablet (0.5 mg total) by mouth at bedtime as needed for anxiety. 30 tablet 2   ASPIRIN 81 PO Take 81 mg by mouth daily in the afternoon.     atorvastatin  (LIPITOR) 10 MG tablet Take 1 tablet (10 mg total) by mouth daily. 90 tablet 1   buPROPion  (WELLBUTRIN  XL) 300 MG 24 hr tablet Take 1 tablet (300 mg total) by mouth daily. 90 tablet 1   busPIRone  (BUSPAR ) 10 MG tablet Take 2 tablets (20 mg total) by mouth 2 (two) times daily. 360 tablet 1   citalopram  (CELEXA ) 40 MG tablet Take 1 tablet (40 mg total) by mouth daily. 90 tablet 1   Continuous Glucose Receiver (DEXCOM G7 RECEIVER) DEVI Use as directed. 9 each 1   Continuous Glucose Sensor (DEXCOM G7 SENSOR) MISC Attach to skin as directed. Replace every 10 days. 3 each 3   Cyanocobalamin  (VITAMIN B12) 1000 MCG TBCR Take by mouth.     dapagliflozin  propanediol (FARXIGA ) 10 MG TABS tablet Take 1 tablet (10 mg total) by mouth daily before breakfast. 90 tablet 1   dicyclomine  (BENTYL ) 10 MG capsule Take 1 capsule (10 mg total) by mouth 3 (three) times daily as needed. 90 capsule 0   Estradiol  10 MCG TABS vaginal tablet Place 1 tablet (10 mcg total) vaginally 2 (two) times a week. 24 tablet 3   eszopiclone  (LUNESTA ) 2 MG TABS tablet Take 1 tablet (2 mg total) by mouth at bedtime as needed for  sleep. Take immediately before bedtime 90 tablet 0   loratadine (CLARITIN) 10 MG tablet      meloxicam  (MOBIC ) 15 MG tablet Take 1 tablet (15 mg total) by mouth daily. 30 tablet 1   pantoprazole  (PROTONIX ) 20 MG tablet Take 1 tablet (20 mg) by mouth daily. 90 tablet 3   UNABLE TO FIND Med Name: Magnesium Sleeplex by [employer]     VITAMIN D  PO Take 5,000 Units by mouth.     No facility-administered medications prior to visit.    ROS Review of Systems  Objective:  LMP 02/02/2013   BP Readings from Last 3 Encounters:  12/02/23 136/84  12/01/23 118/72  11/05/23 122/74    Wt Readings from Last 3 Encounters:  12/02/23 161 lb 3.2 oz (73.1 kg)  12/01/23 160 lb (72.6 kg)  11/05/23 158 lb (71.7 kg)    Physical Exam  Lab Results  Component Value Date   WBC 6.4 04/02/2023   HGB 14.3 04/02/2023   HCT 42.0 04/02/2023   PLT 365.0 04/02/2023   GLUCOSE 119 (H) 10/30/2023   CHOL 151 06/26/2023   TRIG 60 06/26/2023   HDL 52 06/26/2023   LDLCALC 87 06/26/2023   ALT 16 06/26/2023   AST 15 06/26/2023   NA 136  10/30/2023   K 5.0 10/30/2023   CL 99 10/30/2023   CREATININE 0.72 10/30/2023   BUN 14 10/30/2023   CO2 23 10/30/2023   TSH 2.430 06/26/2023   INR 1.0 09/18/2006   HGBA1C 6.4 09/29/2023   MICROALBUR <0.7 04/02/2023    No results found.  Assessment & Plan:  There are no diagnoses linked to this encounter.   Follow-up: No follow-ups on file.  Debby Molt, MD "

## 2024-01-13 ENCOUNTER — Other Ambulatory Visit (HOSPITAL_COMMUNITY): Payer: Self-pay

## 2024-01-26 ENCOUNTER — Ambulatory Visit (INDEPENDENT_AMBULATORY_CARE_PROVIDER_SITE_OTHER): Admitting: Psychiatry

## 2024-01-26 DIAGNOSIS — F411 Generalized anxiety disorder: Secondary | ICD-10-CM

## 2024-01-26 NOTE — Progress Notes (Signed)
 "       [facility] Counselor/Therapist Progress Note  Patient ID: [name], MRN: [id number],    Date: 01/26/2024  Time Spent: 51 minutes start time 12:57 PM end time 1:48 PM Virtual Visit via Video Note Connected with patient by a telemedicine/telehealth application, with their informed consent, and verified patient privacy and that I am speaking with the correct person using two identifiers. I discussed the limitations, risks, security and privacy concerns of performing psychotherapy and the availability of in person appointments. I also discussed with the patient that there may be a patient responsible charge related to this service. The patient expressed understanding and agreed to proceed. I discussed the treatment planning with the patient. The patient was provided an opportunity to ask questions and all were answered. The patient agreed with the plan and demonstrated an understanding of the instructions. The patient was advised to call  our office if  symptoms worsen or feel they are in a crisis state and need immediate contact.   Therapist Location: home Patient Location: home    Treatment Type: Individual Therapy  Reported Symptoms: anxiety, sadness, triggered responses, grief issues, rumination  Mental Status Exam:  Appearance:   Well Groomed     Behavior:  Appropriate  Motor:  Normal  Speech/Language:   Normal Rate  Affect:  Appropriate  Mood:  anxious  Thought process:  normal  Thought content:    WNL  Sensory/Perceptual disturbances:    WNL  Orientation:  oriented to person, place, time/date, and situation  Attention:  Good  Concentration:  Good  Memory:  WNL  Fund of knowledge:   Good  Insight:    Good  Judgment:   Good  Impulse Control:  Good   Risk Assessment: Danger to Self:  No Self-injurious Behavior: No Danger to Others: No Duty to Warn:no Physical Aggression / Violence:No  Access to Firearms a concern: No  Gang Involvement:No   Subjective: Met  with patient via virtual session. She shared that she was able to get her living room cleaned and had a great Holiday with decorating. She went on to share that she is starting to work on her bedroom because she is excited about getting things done. She went on to share that her boyfriend is helping her get things done at her house and it is wonderful but stirring up stuff for her. She is becoming aware that  this has not been her life which is confusing for her.  She went on to share she is seeing that she likes things to be nice and put away but it got too overwhelming with her husband so she lost part of her.  Patient went on to share she sometimes starts feeling anxious about the possibility of the relationship not working out.  Encouraged her to think through what she is already gained in the situation.  She realizes now that she deserves to have a partner who will do things with her, that she likes for her house to be clean and neat, and that even on her own she can be okay.  Patient was able to see that those lessons were worth what ever happens in the relationship.  Also challenged her to think through the facts of the situation and as she did that she could see that he really does care about her and that she needs to focus on those truths rather than allowing herself to ruminate on what if that are not realistic.  Patient was encouraged  to continue reminding herself of the facts and to continue taking 1 project at a time so she can feel that she is getting her house back to where she wanted it to be.  Interventions: Cognitive Behavioral Therapy and Solution-Oriented/Positive Psychology  Diagnosis:   ICD-10-CM   1. Generalized anxiety disorder  F41.1       Plan:  Patient is to practice grounding skills CBT and DBT skills to decrease anxiety symptoms.  Patient is to try and take things just as far as the next trip regarding her relationship.  Patient is to continue working on getting rid of things  from her house and encouraging her to feel good about that process.  Patient is to journal and to exercise to release negative emotions appropriately.  Patient is to continue working with provider Regina Mozingo, NP for medication management.   Silvano Pacini, [facility]                   "

## 2024-01-27 ENCOUNTER — Other Ambulatory Visit (HOSPITAL_COMMUNITY): Payer: Self-pay

## 2024-01-28 ENCOUNTER — Other Ambulatory Visit (HOSPITAL_COMMUNITY): Payer: Self-pay

## 2024-02-16 ENCOUNTER — Ambulatory Visit (INDEPENDENT_AMBULATORY_CARE_PROVIDER_SITE_OTHER): Admitting: Psychiatry

## 2024-02-16 ENCOUNTER — Encounter: Payer: Self-pay | Admitting: Psychiatry

## 2024-02-16 DIAGNOSIS — F411 Generalized anxiety disorder: Secondary | ICD-10-CM | POA: Diagnosis not present

## 2024-02-16 NOTE — Progress Notes (Unsigned)
 "       [facility] Counselor/Therapist Progress Note  Patient ID: TIPHANIE VO, MRN: [id number],    Date: 02/16/2024  Time Spent: 51 minutes start time 1:01 PM end time 1:52 PM Virtual Visit via Video Note Connected with patient by a telemedicine/telehealth application, with their informed consent, and verified patient privacy and that I am speaking with the correct person using two identifiers. I discussed the limitations, risks, security and privacy concerns of performing psychotherapy and the availability of in person appointments. I also discussed with the patient that there may be a patient responsible charge related to this service. The patient expressed understanding and agreed to proceed. I discussed the treatment planning with the patient. The patient was provided an opportunity to ask questions and all were answered. The patient agreed with the plan and demonstrated an understanding of the instructions. The patient was advised to call  our office if  symptoms worsen or feel they are in a crisis state and need immediate contact.   Therapist Location: home Patient Location: home    Treatment Type: Individual Therapy  Reported Symptoms: sadness, grief, anxiety, sleep issues, panic, rumination  Mental Status Exam:  Appearance:   Casual     Behavior:  Appropriate  Motor:  Normal  Speech/Language:   Normal Rate  Affect:  Appropriate and Tearful  Mood:  anxious and sad  Thought process:  normal  Thought content:    WNL  Sensory/Perceptual disturbances:    WNL  Orientation:  oriented to person, place, time/date, and situation  Attention:  Good  Concentration:  Good  Memory:  WNL  Fund of knowledge:   Good  Insight:    Good  Judgment:   Good  Impulse Control:  Good   Risk Assessment: Danger to Self:  No Self-injurious Behavior: No Danger to Others: No Duty to Warn:no Physical Aggression / Violence:No  Access to Firearms a concern: No  Gang Involvement:No    Subjective: Met with patient via virtual session. She shared that she is getting through the snow well. She went on to share that her living room is finally in order and it is such a relief for her.She shared it brought up so much emotion for her. She acknowledged that there is some grief surfacing since her boyfriend is helping so much so is seeing that things were not healthy in her marriage and things could have been very different.  Patient was encouraged to continue finding ways to release the stuff at the same time release all of the hurt and sadness over her husband not being able to be who she needed him to be.  Patient was encouraged to see that she is making lots of progress and as she is dealing with the pain of the past and releasing it show her mood is improving and she is having more of a positive outlook on the future.  Patient shared she is also trying to save things that she can give her deceased husband's sons so that they can have a part of his history but she does not have to hold onto it for them but can release that because it is not part of her family.  Patient again was encouraged to feel positive about all the good changes she is making for herself.  Interventions: Solution-Oriented/Positive Psychology and Insight-Oriented  Diagnosis:   ICD-10-CM   1. Generalized anxiety disorder  F41.1       Plan: Patient is to practice grounding skills CBT and  DBT skills to decrease anxiety symptoms.  Patient is to try and take things just as far as the next trip regarding her relationship.  Patient is to continue working on getting rid of things from her house and encouraging her to feel good about that process.  Patient is to journal and to exercise to release negative emotions appropriately.  Patient is to continue working with provider Regina Mozingo, NP for medication management.   Silvano Pacini, [facility]                   "

## 2024-03-01 ENCOUNTER — Ambulatory Visit: Admitting: Radiology

## 2024-03-07 ENCOUNTER — Ambulatory Visit: Admitting: Internal Medicine

## 2024-03-09 ENCOUNTER — Ambulatory Visit: Admitting: Psychiatry

## 2024-03-10 ENCOUNTER — Encounter: Admitting: Neurology

## 2024-03-11 ENCOUNTER — Ambulatory Visit (INDEPENDENT_AMBULATORY_CARE_PROVIDER_SITE_OTHER): Admitting: Adult Health

## 2024-03-28 ENCOUNTER — Ambulatory Visit: Admitting: "Endocrinology
